# Patient Record
Sex: Female | Born: 1990 | Race: White | Hispanic: No | Marital: Single | Smoking: Former smoker
Health system: Southern US, Community
[De-identification: ages and names within clinical notes are randomized; demographics above are authoritative.]

## PROBLEM LIST (undated history)

## (undated) DIAGNOSIS — A749 Chlamydial infection, unspecified: Secondary | ICD-10-CM

## (undated) DIAGNOSIS — Z789 Other specified health status: Secondary | ICD-10-CM

## (undated) HISTORY — PX: CHOLECYSTECTOMY: SHX55

## (undated) HISTORY — DX: Chlamydial infection, unspecified: A74.9

## (undated) HISTORY — PX: WISDOM TOOTH EXTRACTION: SHX21

---

## 2016-01-26 ENCOUNTER — Emergency Department (HOSPITAL_COMMUNITY)
Admission: EM | Admit: 2016-01-26 | Discharge: 2016-01-26 | Disposition: A | Discharge status: Home / Self Care | Payer: BLUE CROSS/BLUE SHIELD | Attending: Emergency Medicine | Admitting: Emergency Medicine

## 2016-01-26 ENCOUNTER — Encounter (HOSPITAL_COMMUNITY): Payer: Self-pay | Admitting: Emergency Medicine

## 2016-01-26 DIAGNOSIS — N390 Urinary tract infection, site not specified: Secondary | ICD-10-CM | POA: Diagnosis not present

## 2016-01-26 DIAGNOSIS — Z711 Person with feared health complaint in whom no diagnosis is made: Secondary | ICD-10-CM

## 2016-01-26 DIAGNOSIS — Z87891 Personal history of nicotine dependence: Secondary | ICD-10-CM | POA: Insufficient documentation

## 2016-01-26 DIAGNOSIS — R300 Dysuria: Secondary | ICD-10-CM | POA: Diagnosis present

## 2016-01-26 DIAGNOSIS — Z79899 Other long term (current) drug therapy: Secondary | ICD-10-CM | POA: Insufficient documentation

## 2016-01-26 LAB — COMPREHENSIVE METABOLIC PANEL
ALBUMIN: 3.7 g/dL (ref 3.5–5.0)
ALK PHOS: 85 U/L (ref 38–126)
ALT: 15 U/L (ref 14–54)
ANION GAP: 5 (ref 5–15)
AST: 14 U/L — ABNORMAL LOW (ref 15–41)
BUN: 14 mg/dL (ref 6–20)
CALCIUM: 8.9 mg/dL (ref 8.9–10.3)
CO2: 24 mmol/L (ref 22–32)
Chloride: 107 mmol/L (ref 101–111)
Creatinine, Ser: 0.85 mg/dL (ref 0.44–1.00)
GFR calc Af Amer: >60 mL/min (ref >60)
GFR calc non Af Amer: >60 mL/min (ref >60)
GLUCOSE: 101 mg/dL — AB (ref 65–99)
Potassium: 4.1 mmol/L (ref 3.5–5.1)
SODIUM: 136 mmol/L (ref 135–145)
Total Bilirubin: 0.7 mg/dL (ref 0.3–1.2)
Total Protein: 7.1 g/dL (ref 6.5–8.1)

## 2016-01-26 LAB — WET PREP, GENITAL
Sperm: NONE SEEN
Trich, Wet Prep: NONE SEEN
Yeast Wet Prep HPF POC: NONE SEEN

## 2016-01-26 LAB — URINALYSIS, ROUTINE W REFLEX MICROSCOPIC
Bilirubin Urine: NEGATIVE
GLUCOSE, UA: NEGATIVE mg/dL
Ketones, ur: NEGATIVE mg/dL
Nitrite: NEGATIVE
PH: 6 (ref 5.0–8.0)
Protein, ur: NEGATIVE mg/dL
SPECIFIC GRAVITY, URINE: 1.015 (ref 1.005–1.030)

## 2016-01-26 LAB — CBC WITH DIFFERENTIAL/PLATELET
BASOS PCT: 0 %
Basophils Absolute: 0 10*3/uL (ref 0.0–0.1)
Eosinophils Absolute: 0.1 10*3/uL (ref 0.0–0.7)
Eosinophils Relative: 1 %
HEMATOCRIT: 41 % (ref 36.0–46.0)
HEMOGLOBIN: 13.5 g/dL (ref 12.0–15.0)
LYMPHS ABS: 1.9 10*3/uL (ref 0.7–4.0)
LYMPHS PCT: 18 %
MCH: 28.8 pg (ref 26.0–34.0)
MCHC: 32.9 g/dL (ref 30.0–36.0)
MCV: 87.4 fL (ref 78.0–100.0)
MONOS PCT: 6 %
Monocytes Absolute: 0.6 10*3/uL (ref 0.1–1.0)
NEUTROS ABS: 7.9 10*3/uL — AB (ref 1.7–7.7)
NEUTROS PCT: 75 %
Platelets: 233 10*3/uL (ref 150–400)
RBC: 4.69 MIL/uL (ref 3.87–5.11)
RDW: 13.7 % (ref 11.5–15.5)
WBC: 10.5 10*3/uL (ref 4.0–10.5)

## 2016-01-26 LAB — URINE MICROSCOPIC-ADD ON

## 2016-01-26 LAB — PREGNANCY, URINE: Preg Test, Ur: NEGATIVE

## 2016-01-26 MED ORDER — AZITHROMYCIN 250 MG PO TABS
1000.0000 mg | ORAL_TABLET | Freq: Once | ORAL | Status: AC
  Administered 2016-01-26: 1000 mg via ORAL
  Filled 2016-01-26: qty 4

## 2016-01-26 MED ORDER — METRONIDAZOLE 500 MG PO TABS: 500.0000 mg | ORAL_TABLET | Freq: Two times a day (BID) | ORAL | Status: DC

## 2016-01-26 MED ORDER — NAPROXEN 250 MG PO TABS: 250.0000 mg | ORAL_TABLET | Freq: Two times a day (BID) | ORAL | Status: DC

## 2016-01-26 MED ORDER — SODIUM CHLORIDE 0.9 % IV BOLUS (SEPSIS)
1000.0000 mL | Freq: Once | INTRAVENOUS | Status: AC
  Administered 2016-01-26: 1000 mL via INTRAVENOUS

## 2016-01-26 MED ORDER — ACETAMINOPHEN 325 MG PO TABS
650.0000 mg | ORAL_TABLET | Freq: Once | ORAL | Status: AC
  Administered 2016-01-26: 650 mg via ORAL
  Filled 2016-01-26: qty 2

## 2016-01-26 MED ORDER — LIDOCAINE HCL (PF) 1 % IJ SOLN
INTRAMUSCULAR | Status: AC
  Filled 2016-01-26: qty 5

## 2016-01-26 MED ORDER — CEFTRIAXONE SODIUM 250 MG IJ SOLR
250.0000 mg | Freq: Once | INTRAMUSCULAR | Status: AC
  Administered 2016-01-26: 250 mg via INTRAMUSCULAR
  Filled 2016-01-26: qty 250

## 2016-01-26 MED ORDER — ONDANSETRON 4 MG PO TBDP: 4.0000 mg | ORAL_TABLET | Freq: Three times a day (TID) | ORAL | Status: DC | PRN

## 2016-01-26 MED ORDER — ONDANSETRON HCL 4 MG/2ML IJ SOLN
4.0000 mg | Freq: Once | INTRAMUSCULAR | Status: AC
  Administered 2016-01-26: 4 mg via INTRAVENOUS
  Filled 2016-01-26: qty 2

## 2016-01-26 MED ORDER — CEPHALEXIN 500 MG PO CAPS: 500.0000 mg | ORAL_CAPSULE | Freq: Four times a day (QID) | ORAL | Status: DC

## 2016-01-26 NOTE — ED Notes (Addendum)
Pt reports UTI x2 weeks but pt has not been on antibiotics. Pt reports burning with urination and odor.  Pt has been having fevers/chills and emesis.  PT has been hospitalized for UTI in past.

## 2016-01-26 NOTE — Discharge Instructions (Signed)
Urinary Tract Infection Urinary tract infections (UTIs) can develop anywhere along your urinary tract. Your urinary tract is your body's drainage system for removing wastes and extra water. Your urinary tract includes two kidneys, two ureters, a bladder, and a urethra. Your kidneys are a pair of bean-shaped organs. Each kidney is about the size of your fist. They are located below your ribs, one on each side of your spine. CAUSES Infections are caused by microbes, which are microscopic organisms, including fungi, viruses, and bacteria. These organisms are so small that they can only be seen through a microscope. Bacteria are the microbes that most commonly cause UTIs. SYMPTOMS  Symptoms of UTIs may vary by age and gender of the patient and by the location of the infection. Symptoms in young women typically include a frequent and intense urge to urinate and a painful, burning feeling in the bladder or urethra during urination. Older women and men are more likely to be tired, shaky, and weak and have muscle aches and abdominal pain. A fever may mean the infection is in your kidneys. Other symptoms of a kidney infection include pain in your back or sides below the ribs, nausea, and vomiting. DIAGNOSIS To diagnose a UTI, your caregiver will ask you about your symptoms. Your caregiver will also ask you to provide a urine sample. The urine sample will be tested for bacteria and white blood cells. White blood cells are made by your body to help fight infection. TREATMENT  Typically, UTIs can be treated with medication. Because most UTIs are caused by a bacterial infection, they usually can be treated with the use of antibiotics. The choice of antibiotic and length of treatment depend on your symptoms and the type of bacteria causing your infection. HOME CARE INSTRUCTIONS  If you were prescribed antibiotics, take them exactly as your caregiver instructs you. Finish the medication even if you feel better after  you have only taken some of the medication.  Drink enough water and fluids to keep your urine clear or pale yellow.  Avoid caffeine, tea, and carbonated beverages. They tend to irritate your bladder.  Empty your bladder often. Avoid holding urine for long periods of time.  Empty your bladder before and after sexual intercourse.  After a bowel movement, women should cleanse from front to back. Use each tissue only once. SEEK MEDICAL CARE IF:   You have back pain.  You develop a fever.  Your symptoms do not begin to resolve within 3 days. SEEK IMMEDIATE MEDICAL CARE IF:   You have severe back pain or lower abdominal pain.  You develop chills.  You have nausea or vomiting.  You have continued burning or discomfort with urination. MAKE SURE YOU:   Understand these instructions.  Will watch your condition.  Will get help right away if you are not doing well or get worse.   This information is not intended to replace advice given to you by your health care provider. Make sure you discuss any questions you have with your health care provider.   Document Released: 05/17/2005 Document Revised: 04/28/2015 Document Reviewed: 09/15/2011 Elsevier Interactive Patient Education 2016 [employer] Sexually Transmitted Disease A sexually transmitted disease (STD) is a disease or infection that may be passed (transmitted) from person to person, usually during sexual activity. This may happen by way of saliva, semen, blood, vaginal mucus, or urine. Common STDs include:  Gonorrhea.  Chlamydia.  Syphilis.  HIV and AIDS.  Genital herpes.  Hepatitis B and C.  Trichomonas.  Human papillomavirus (HPV).  Pubic lice.  Scabies.  Mites.  Bacterial vaginosis. WHAT ARE CAUSES OF STDs? An STD may be caused by bacteria, a virus, or parasites. STDs are often transmitted during sexual activity if one person is infected. However, they may also be transmitted through nonsexual means.  STDs may be transmitted after:   Sexual intercourse with an infected person.  Sharing sex toys with an infected person.  Sharing needles with an infected person or using unclean piercing or tattoo needles.  Having intimate contact with the genitals, mouth, or rectal areas of an infected person.  Exposure to infected fluids during birth. WHAT ARE THE SIGNS AND SYMPTOMS OF STDs? Different STDs have different symptoms. Some people may not have any symptoms. If symptoms are present, they may include:  Painful or bloody urination.  Pain in the pelvis, abdomen, vagina, anus, throat, or eyes.  A skin rash, itching, or irritation.  Growths, ulcerations, blisters, or sores in the genital and anal areas.  Abnormal vaginal discharge with or without bad odor.  Penile discharge in men.  Fever.  Pain or bleeding during sexual intercourse.  Swollen glands in the groin area.  Yellow skin and eyes (jaundice). This is seen with hepatitis.  Swollen testicles.  Infertility.  Sores and blisters in the mouth. HOW ARE STDs DIAGNOSED? To make a diagnosis, your health care provider may:  Take a medical history.  Perform a physical exam.  Take a sample of any discharge to examine.  Swab the throat, cervix, opening to the penis, rectum, or vagina for testing.  Test a sample of your first morning urine.  Perform blood tests.  Perform a Pap test, if this applies.  Perform a colposcopy.  Perform a laparoscopy. HOW ARE STDs TREATED? Treatment depends on the STD. Some STDs may be treated but not cured.  Chlamydia, gonorrhea, trichomonas, and syphilis can be cured with antibiotic medicine.  Genital herpes, hepatitis, and HIV can be treated, but not cured, with prescribed medicines. The medicines lessen symptoms.  Genital warts from HPV can be treated with medicine or by freezing, burning (electrocautery), or surgery. Warts may come back.  HPV cannot be cured with medicine or  surgery. However, abnormal areas may be removed from the cervix, vagina, or vulva.  If your diagnosis is confirmed, your recent sexual partners need treatment. This is true even if they are symptom-free or have a negative culture or evaluation. They should not have sex until their health care providers say it is okay.  Your health care provider may test you for infection again 3 months after treatment. HOW CAN I REDUCE MY RISK OF GETTING AN STD? Take these steps to reduce your risk of getting an STD:  Use latex condoms, dental dams, and water-soluble lubricants during sexual activity. Do not use petroleum jelly or oils.  Avoid having multiple sex partners.  Do not have sex with someone who has other sex partners  Do not have sex with anyone you do not know or who is at high risk for an STD.  Avoid risky sex practices that can break your skin.  Do not have sex if you have open sores on your mouth or skin.  Avoid drinking too much alcohol or taking illegal drugs. Alcohol and drugs can affect your judgment and put you in a vulnerable position.  Avoid engaging in oral and anal sex acts.  Get vaccinated for HPV and hepatitis. If you have not received these vaccines in the past,  talk to your health care provider about whether one or both might be right for you.  If you are at risk of being infected with HIV, it is recommended that you take a prescription medicine daily to prevent HIV infection. This is called pre-exposure prophylaxis (PrEP). You are considered at risk if:  You are a man who has sex with other men (MSM).  You are a heterosexual man or woman and are sexually active with more than one partner.  You take drugs by injection.  You are sexually active with a partner who has HIV.  Talk with your health care provider about whether you are at high risk of being infected with HIV. If you choose to begin PrEP, you should first be tested for HIV. You should then be tested every 3  months for as long as you are taking PrEP. WHAT SHOULD I DO IF I THINK I HAVE AN STD?  See your health care provider.  Tell your sexual partner(s). They should be tested and treated for any STDs.  Do not have sex until your health care provider says it is okay. WHEN SHOULD I GET IMMEDIATE MEDICAL CARE? Contact your health care provider right away if:   You have severe abdominal pain.  You are a man and notice swelling or pain in your testicles.  You are a woman and notice swelling or pain in your vagina.   This information is not intended to replace advice given to you by your health care provider. Make sure you discuss any questions you have with your health care provider.   Document Released: 10/28/2002 Document Revised: 08/28/2014 Document Reviewed: 02/25/2013 Elsevier Interactive Patient Education [employer]

## 2016-01-26 NOTE — ED Provider Notes (Signed)
CSN: [id number]     Arrival date & time 01/26/16  1043 History   First MD Initiated Contact with Patient 01/26/16 1102     Chief Complaint  Patient presents with  . Urinary Tract Infection    Denise Buck is a 25 y.o. female Who presents to the emergency department complaining of UTI symptoms for the past 2 weeks. Patient reports she's had dysuria, urinary frequency and urinary urgency and urine odor for about 2 weeks. She reports developing a subjective fever, chills and body aches over the past 2-3 days. She reports she's also had some vomiting after eating. She also reports she developed vaginal discharge over the past 2 days. She has taken nothing for treatment of her symptoms today. She reports a maximum temperature of 99.8 at home. She reports intermittent abdominal pain. She denies current abdominal pain. The patient denies diarrhea, hematuria, flank pain, hematemesis, chest pain, shortness of breath, vaginal bleeding, rashes.  The history is provided by the patient. No language interpreter was used.    History reviewed. No pertinent past medical history. Past Surgical History  Procedure Laterality Date  . Cholecystectomy    . Cesarean section     History reviewed. No pertinent family history. Social History  Substance Use Topics  . Smoking status: Former Games developer  . Smokeless tobacco: None  . Alcohol Use: Yes     Comment: occ   OB History    No data available     Review of Systems  Constitutional: Positive for fever and chills.  HENT: Negative for congestion, mouth sores and sore throat.   Eyes: Negative for visual disturbance.  Respiratory: Negative for cough and shortness of breath.   Cardiovascular: Negative for chest pain.  Gastrointestinal: Positive for nausea and vomiting. Negative for abdominal pain, diarrhea and blood in stool.  Genitourinary: Positive for dysuria, urgency, frequency and vaginal discharge. Negative for hematuria, flank pain, decreased urine  volume, vaginal bleeding, difficulty urinating, genital sores and pelvic pain.  Musculoskeletal: Positive for back pain and arthralgias. Negative for neck pain.  Skin: Negative for rash.  Neurological: Negative for weakness and headaches.      Allergies  Hydrocodone  Home Medications   Prior to Admission medications   Medication Sig Start Date End Date Taking? Authorizing Provider  acetaminophen (TYLENOL) 500 MG tablet Take 1,000 mg by mouth every 6 (six) hours as needed for moderate pain.   Yes Historical Provider, MD  cephALEXin (KEFLEX) 500 MG capsule Take 1 capsule (500 mg total) by mouth 4 (four) times daily. 01/26/16   Everlene Farrier, PA-C  metroNIDAZOLE (FLAGYL) 500 MG tablet Take 1 tablet (500 mg total) by mouth 2 (two) times daily. 01/26/16   Everlene Farrier, PA-C  naproxen (NAPROSYN) 250 MG tablet Take 1 tablet (250 mg total) by mouth 2 (two) times daily with a meal. 01/26/16   Everlene Farrier, PA-C  ondansetron (ZOFRAN ODT) 4 MG disintegrating tablet Take 1 tablet (4 mg total) by mouth every 8 (eight) hours as needed for nausea or vomiting. 01/26/16   Everlene Farrier, PA-C   BP 142/78 mmHg  Pulse 78  Temp(Src) 98 F (36.7 C) (Oral)  Resp 16  SpO2 100%  LMP 01/11/2016 Physical Exam  Constitutional: She appears well-developed and well-nourished. No distress.  Nontoxic appearing.  HENT:  Head: Normocephalic and atraumatic.  Mouth/Throat: Oropharynx is clear and moist.  Eyes: Conjunctivae are normal. Pupils are equal, round, and reactive to light. Right eye exhibits no discharge. Left eye exhibits no discharge.  Neck: Neck supple.  Cardiovascular: Normal rate, regular rhythm, normal heart sounds and intact distal pulses.  Exam reveals no gallop and no friction rub.   No murmur heard. Heart rate is 76.  Pulmonary/Chest: Effort normal and breath sounds normal. No respiratory distress. She has no wheezes. She has no rales.  Abdominal: Soft. Bowel sounds are normal. She exhibits no  distension. There is no tenderness. There is no guarding.  Abdomen is soft and nontender to palpation.  Genitourinary: Vaginal discharge found.  Pelvic exam performed by me with female RN chaperone. Patient has a moderate amount of white vaginal discharge. Cervix is closed. No vaginal bleeding. Patient was a mild cervical motion tenderness. Mild adnexal tenderness bilaterally. No adnexal fullness.  Musculoskeletal: She exhibits no edema.  Lymphadenopathy:    She has no cervical adenopathy.  Neurological: She is alert. Coordination normal.  Skin: Skin is warm and dry. No rash noted. She is not diaphoretic. No erythema. No pallor.  Psychiatric: She has a normal mood and affect. Her behavior is normal.  Nursing note and vitals reviewed.   ED Course  Procedures (including critical care time) Labs Review Labs Reviewed  WET PREP, GENITAL - Abnormal; Notable for the following:    Clue Cells Wet Prep HPF POC PRESENT (*)    WBC, Wet Prep HPF POC MANY (*)    All other components within normal limits  URINALYSIS, ROUTINE W REFLEX MICROSCOPIC (NOT AT [facility]) - Abnormal; Notable for the following:    Hgb urine dipstick MODERATE (*)    Leukocytes, UA MODERATE (*)    All other components within normal limits  CBC WITH DIFFERENTIAL/PLATELET - Abnormal; Notable for the following:    Neutro Abs 7.9 (*)    All other components within normal limits  URINE MICROSCOPIC-ADD ON - Abnormal; Notable for the following:    Squamous Epithelial / LPF 0-5 (*)    Bacteria, UA FEW (*)    All other components within normal limits  COMPREHENSIVE METABOLIC PANEL - Abnormal; Notable for the following:    Glucose, Bld 101 (*)    AST 14 (*)    All other components within normal limits  URINE CULTURE  PREGNANCY, URINE  GC/CHLAMYDIA PROBE AMP ([place]) NOT AT [facility]    Imaging Review No results found. I have personally reviewed and evaluated these images and lab results as part of my medical decision-making.    EKG Interpretation None      Filed Vitals:   01/26/16 1101 01/26/16 1150 01/26/16 1332  BP: 120/88 155/94 142/78  Pulse: 124 76 78  Temp: 98.5 F (36.9 C)  98 F (36.7 C)  TempSrc: Oral  Oral  Resp: 20 16 16   SpO2: 100% 100% 100%     MDM   Meds given in ED:  Medications  cefTRIAXone (ROCEPHIN) injection 250 mg (not administered)  azithromycin (ZITHROMAX) tablet 1,000 mg (not administered)  lidocaine (PF) (XYLOCAINE) 1 % injection (not administered)  sodium chloride 0.9 % bolus 1,000 mL (0 mLs Intravenous Stopped 01/26/16 1302)  ondansetron (ZOFRAN) injection 4 mg (4 mg Intravenous Given 01/26/16 1252)  acetaminophen (TYLENOL) tablet 650 mg (650 mg Oral Given 01/26/16 1307)    New Prescriptions   CEPHALEXIN (KEFLEX) 500 MG CAPSULE    Take 1 capsule (500 mg total) by mouth 4 (four) times daily.   METRONIDAZOLE (FLAGYL) 500 MG TABLET    Take 1 tablet (500 mg total) by mouth 2 (two) times daily.   NAPROXEN (NAPROSYN) 250 MG TABLET  Take 1 tablet (250 mg total) by mouth 2 (two) times daily with a meal.   ONDANSETRON (ZOFRAN ODT) 4 MG DISINTEGRATING TABLET    Take 1 tablet (4 mg total) by mouth every 8 (eight) hours as needed for nausea or vomiting.    Final diagnoses:  UTI (lower urinary tract infection)  Concern about STD in female without diagnosis   This  is a 25 y.o. female Who presents to the emergency department complaining of UTI symptoms for the past 2 weeks. Patient reports she's had dysuria, urinary frequency and urinary urgency and urine odor for about 2 weeks. She reports developing a subjective fever, chills and body aches over the past 2-3 days. She reports she's also had some vomiting after eating. She also reports she developed vaginal discharge over the past 2 days. She has taken nothing for treatment of her symptoms today. She reports a maximum temperature of 99.8 at home. She reports intermittent abdominal pain. She denies current abdominal pain. On exam the patient  is afebrile nontoxic appearing. Her abdomen is soft and nontender to palpation. On pelvic exam she has moderate white vaginal discharge. Some very mild cervical motion tenderness. Urinalysis shows moderate leukocytes and too numerous to count white blood cells. Pregnancy test is negative. CBC and CMP are unremarkable. Urine sent for culture. Wet prep reveals clue cells and many white blood cells. We'll treat with Rocephin and azithromycin in the emergency department and discharged with prescriptions for Keflex 4 times a day for 10 days and Flagyl. Also discharged with Zofran and naproxen. Patient is tolerating by mouth in the emergency department. She is still afebrile and nontoxic appearing. Abdomen is nontender to palpation. We'll discharge with close follow-up by primary care and health department for routine STD checks. I discussed that she has pending gonorrhea and Chlamydia testing. I discussed safe sex practices. I advised the patient to follow-up with their primary care provider this week. I advised the patient to return to the emergency department with new or worsening symptoms or new concerns. The patient verbalized understanding and agreement with plan.      Everlene Farrier, PA-C 01/26/16 1341  Jacalyn Lefevre, MD 01/26/16 [id number]

## 2016-01-27 LAB — GC/CHLAMYDIA PROBE AMP (~~LOC~~) NOT AT ARMC
CHLAMYDIA, DNA PROBE: POSITIVE — AB
NEISSERIA GONORRHEA: POSITIVE — AB

## 2016-01-27 LAB — URINE CULTURE

## 2016-01-28 ENCOUNTER — Telehealth: Payer: Self-pay | Admitting: *Deleted

## 2016-01-28 NOTE — ED Notes (Signed)
(+)  STD, chart to EDP for followup 

## 2016-01-30 ENCOUNTER — Telehealth (HOSPITAL_BASED_OUTPATIENT_CLINIC_OR_DEPARTMENT_OTHER): Payer: Self-pay

## 2016-01-30 NOTE — Telephone Encounter (Signed)
Post ED Visit - Positive Culture Follow-up: Unsuccessful Patient Follow-up  Culture assessed and recommendations reviewed by: []  Enzo BiNathan Batchelder, Pharm.D. []  Celedonio MiyamotoJeremy Frens, Pharm.D., BCPS []  Garvin FilaMike Maccia, Pharm.D. []  Georgina PillionElizabeth Martin, Pharm.D., BCPS []  [place]h Pham, [place]D., BCPS, AAHIVP []  Estella HuskMichelle Turner, Pharm.D., BCPS, AAHIVP []  Tennis Mustassie Stewart, Pharm.D. []  Sherle Poeob Vincent, [address]Ph[address]  Positive vaginal culture  [x]  Patient discharged without antimicrobial prescription and treatment is now indicated []  Organism is resistant to prescribed ED discharge antimicrobial []  Patient with positive blood cultures   Unable to contact patient after 3 attempts, letter will be sent to address on file  Jerry CarasCullom, Korinne Greenstein Burnett 01/30/2016, 11:48 AM

## 2016-01-31 ENCOUNTER — Telehealth (HOSPITAL_BASED_OUTPATIENT_CLINIC_OR_DEPARTMENT_OTHER): Payer: Self-pay | Admitting: Emergency Medicine

## 2016-02-01 ENCOUNTER — Emergency Department (HOSPITAL_COMMUNITY)
Admission: EM | Admit: 2016-02-01 | Discharge: 2016-02-01 | Disposition: A | Discharge status: Home / Self Care | Payer: BLUE CROSS/BLUE SHIELD | Attending: Emergency Medicine | Admitting: Emergency Medicine

## 2016-02-01 ENCOUNTER — Encounter (HOSPITAL_COMMUNITY): Payer: Self-pay | Admitting: Emergency Medicine

## 2016-02-01 DIAGNOSIS — A549 Gonococcal infection, unspecified: Secondary | ICD-10-CM | POA: Diagnosis not present

## 2016-02-01 DIAGNOSIS — Z87891 Personal history of nicotine dependence: Secondary | ICD-10-CM | POA: Diagnosis not present

## 2016-02-01 DIAGNOSIS — A749 Chlamydial infection, unspecified: Secondary | ICD-10-CM

## 2016-02-01 DIAGNOSIS — A64 Unspecified sexually transmitted disease: Secondary | ICD-10-CM | POA: Diagnosis present

## 2016-02-01 MED ORDER — LIDOCAINE HCL (PF) 1 % IJ SOLN
0.9000 mL | Freq: Once | INTRAMUSCULAR | Status: AC
  Administered 2016-02-01: 0.9 mL

## 2016-02-01 MED ORDER — AZITHROMYCIN 250 MG PO TABS
1000.0000 mg | ORAL_TABLET | Freq: Once | ORAL | Status: AC
  Administered 2016-02-01: 1000 mg via ORAL
  Filled 2016-02-01: qty 4

## 2016-02-01 MED ORDER — CEFTRIAXONE SODIUM 250 MG IJ SOLR
250.0000 mg | Freq: Once | INTRAMUSCULAR | Status: AC
  Administered 2016-02-01: 250 mg via INTRAMUSCULAR
  Filled 2016-02-01: qty 250

## 2016-02-01 MED ORDER — LIDOCAINE HCL (PF) 1 % IJ SOLN
INTRAMUSCULAR | Status: AC
  Administered 2016-02-01: 0.9 mL
  Filled 2016-02-01: qty 5

## 2016-02-01 NOTE — ED Provider Notes (Signed)
CSN: [id number]     Arrival date & time 02/01/16  0818 History   First MD Initiated Contact with Patient 02/01/16 [phone]     Chief Complaint  Patient presents with  . SEXUALLY TRANSMITTED DISEASE     (Consider location/radiation/quality/duration/timing/severity/associated sxs/prior Treatment) HPI Denise Buck is a 25 y.o. female presents to emergency department for treatment for STDs. She states that she received a phone call that her swabs were positive for an STD and was told to come to the emergency department. Patient was seen 6 days ago for urinary symptoms. She was diagnosed with UTI and cervicitis. While in emergency department she was treated with 250 mg of Rocephin through IV and was given Zithromax 1 g by mouth as well as Flagyl. She states shortly after taking the pills she threw up and she reports seeing pills in her emesis bag. She also does not believe that she was given the antibiotic, states she never saw nurse put anything to her IV other than Zofran. Patient was also given prescription for Keflex and Flagyl which she just filled 2 days ago. She states that she feels that her urinary symptoms are improving. She continues to have small white discharge report some spotting. She denies any fever or chills. She reports mild abdominal cramping. She denies any back pain. She denies any nausea or vomiting. She has no other complaints.   History reviewed. No pertinent past medical history. Past Surgical History  Procedure Laterality Date  . Cholecystectomy    . Cesarean section     No family history on file. Social History  Substance Use Topics  . Smoking status: Former Games developermoker  . Smokeless tobacco: None  . Alcohol Use: Yes     Comment: occ   OB History    No data available     Review of Systems  Constitutional: Negative for fever and chills.  Respiratory: Negative for cough, chest tightness and shortness of breath.   Cardiovascular: Negative for chest pain, palpitations  and leg swelling.  Gastrointestinal: Positive for abdominal pain. Negative for nausea, vomiting and diarrhea.  Genitourinary: Positive for dysuria, vaginal bleeding, vaginal discharge and pelvic pain. Negative for flank pain and vaginal pain.  Musculoskeletal: Negative for myalgias, arthralgias, neck pain and neck stiffness.  Skin: Negative for rash.  Neurological: Negative for dizziness, weakness and headaches.  All other systems reviewed and are negative.     Allergies  Hydrocodone  Home Medications   Prior to Admission medications   Medication Sig Start Date End Date Taking? Authorizing Provider  acetaminophen (TYLENOL) 500 MG tablet Take 1,000 mg by mouth every 6 (six) hours as needed for moderate pain.    Historical Provider, MD  cephALEXin (KEFLEX) 500 MG capsule Take 1 capsule (500 mg total) by mouth 4 (four) times daily. 01/26/16   Everlene FarrierWilliam Dansie, PA-C  metroNIDAZOLE (FLAGYL) 500 MG tablet Take 1 tablet (500 mg total) by mouth 2 (two) times daily. 01/26/16   Everlene FarrierWilliam Dansie, PA-C  naproxen (NAPROSYN) 250 MG tablet Take 1 tablet (250 mg total) by mouth 2 (two) times daily with a meal. 01/26/16   Everlene FarrierWilliam Dansie, PA-C  ondansetron (ZOFRAN ODT) 4 MG disintegrating tablet Take 1 tablet (4 mg total) by mouth every 8 (eight) hours as needed for nausea or vomiting. 01/26/16   Everlene FarrierWilliam Dansie, PA-C   BP 131/85 mmHg  Pulse 69  Temp(Src) 98 F (36.7 C) (Oral)  Resp 16  Ht 5\' 7"  (1.702 m)  Wt 122.471 kg  BMI 42.28  kg/m2  SpO2 98%  LMP 01/11/2016 Physical Exam  Constitutional: She appears well-developed and well-nourished. No distress.  HENT:  Head: Normocephalic.  Eyes: Conjunctivae are normal.  Neck: Neck supple.  Cardiovascular: Normal rate, regular rhythm and normal heart sounds.   Pulmonary/Chest: Effort normal and breath sounds normal. No respiratory distress. She has no wheezes. She has no rales.  Abdominal: Soft. Bowel sounds are normal. She exhibits no distension. There is no  tenderness. There is no rebound.  Musculoskeletal: She exhibits no edema.  Neurological: She is alert.  Skin: Skin is warm and dry.  Psychiatric: She has a normal mood and affect. Her behavior is normal.  Nursing note and vitals reviewed.   ED Course  Procedures (including critical care time) Labs Review Labs Reviewed - No data to display  Imaging Review No results found. I have personally reviewed and evaluated these images and lab results as part of my medical decision-making.   EKG Interpretation None      MDM   Final diagnoses:  Gonorrhea  Chlamydia    Patient emergency department for treatment for STDs. She received a phone call telling her that her gonorrhea and chlamydia test came back positive. She was told to come to the ER. Patient did receive 250 mg of Rocephin IV based on documentation in the visit record, however patient states that she does not think she actually got the medicine prior to leaving. She also states that she was given Zithromax but she states that she vomited shortly after swallowing it and saw the pills in her emesis bag. Patient requested to be treated again. On exam, her abdomen is soft, nontender. I do not think she needs a repeat pelvic exam. Her gonorrhea and chlamydia cultures did come back positive. She is currently taking Flagyl and Keflex which she just got filled 2 days ago for urinary tract infection and BV. Her vital signs are normal. She has no evidence of sepsis or PID based on her history and physical exam. We will treat her with 250 mg of Rocephin IM and 1 g of Zithromax. I have asked her to make sure her partner gets treated as well.   Filed Vitals:   02/01/16 0828  BP: 131/85  Pulse: 69  Temp: 98 F (36.7 C)  TempSrc: Oral  Resp: 16  Height:  (1.702 m)  Weight: 122.471 kg  SpO2: 98%       Jaynie Crumble, PA-C 02/01/16 1211  Blane Ohara, MD 02/01/16 1552

## 2016-02-01 NOTE — Discharge Instructions (Signed)
Continue the antibiotics you were prescribed during last visit for your UTI. Follow up as needed. Make sure your partner is treated as well. No intercourse for 1 week.    Chlamydia, Female Chlamydia is an infection. It is spread through sexual contact. Chlamydia can be in different areas of the body. These areas include the cervix, urethra, throat, or rectum. You may not know you have chlamydia because many people never develop the symptoms. Chlamydia is not difficult to treat once you know you have it. However, if it is left untreated, chlamydia can lead to more serious health problems.  CAUSES  Chlamydia is caused by bacteria. It is a sexually transmitted disease. It is passed from an infected partner during intimate contact. This contact could be with the genitals, mouth, or rectal area. Chlamydia can also be passed from mothers to babies during birth. SIGNS AND SYMPTOMS  There may not be any symptoms. This is often the case early in the infection. If symptoms develop, they may include:  Mild pain and discomfort when urinating.  Redness, soreness, and swelling (inflammation) of the rectum.  Vaginal discharge.  Painful intercourse.  Abdominal pain.  Bleeding between menstrual periods. DIAGNOSIS  To diagnose this infection, your health care provider will do a pelvic exam. Cultures will be taken of the vagina, cervix, urine, and possibly the rectum to verify the diagnosis.  TREATMENT You will be given antibiotic medicines. If you are pregnant, certain types of antibiotics will need to be avoided. Any sexual partners should also be treated, even if they do not show symptoms. Your health care provider may test you for infection again 3 months after treatment. HOME CARE INSTRUCTIONS   Take your antibiotic medicine as directed by your health care provider. Finish the antibiotic even if you start to feel better.  Take medicines only as directed by your health care provider.  Inform any  sexual partners about the infection. They should also be treated.  Do not have sexual contact until your health care provider tells you it is okay.  Get plenty of rest.  Eat a well-balanced diet.  Drink enough fluids to keep your urine clear or pale yellow.  Keep all follow-up visits as directed by your health care provider. SEEK MEDICAL CARE IF:  You have painful urination.  You have abdominal pain.  You have vaginal discharge.  You have painful sexual intercourse.  You have bleeding between periods and after sex.  You have a fever. SEEK IMMEDIATE MEDICAL CARE IF:   You experience nausea or vomiting.  You experience excessive sweating (diaphoresis).  You have difficulty swallowing.   This information is not intended to replace advice given to you by your health care provider. Make sure you discuss any questions you have with your health care provider.   Document Released: 05/17/2005 Document Revised: 04/28/2015 Document Reviewed: 04/14/2013 Elsevier Interactive Patient Education [employer]  Gonorrhea Gonorrhea is an infection that can cause serious problems. If left untreated, the infection may:   Damage the female or female organs.   Cause women to be unable to have children (sterility).   Harm a fetus if the infected woman is pregnant.  It is important to get treatment for gonorrhea as soon as possible. It is also necessary that all your sexual partners be tested for the infection.  CAUSES  Gonorrhea is caused by bacteria called Neisseria gonorrhoeae. The infection is spread from person to person, usually by sexual contact (such as by anal, vaginal, or oral  means). A newborn can contract the infection from his or her mother during birth.  RISK FACTORS  Being a woman younger than 24 years of age who is sexually active.  Being a woman 24 years of age or older who has:  A new sex partner.  More than one sex partner.  A sex partner who has a sexually  transmitted disease (STD).  Using condoms inconsistently.  Currently having, or having previously had, an STD.  Exchanging sex or money or drugs. SYMPTOMS  Some people with gonorrhea do not have symptoms. Symptoms may be different in females and males.  Females The most common symptoms are:   Pain in the lower abdomen.   Fever with or without chills.  Other symptoms include:   Abnormal vaginal discharge.   Painful intercourse.   Burning or itching of the vagina or lips of the vagina.   Abnormal vaginal bleeding.   Pain when urinating.   Long-lasting (chronic) pain in the lower abdomen, especially during menstruation or intercourse.   Inability to become pregnant.   Going into premature labor.   Irritation, pain, bleeding, or discharge from the rectum. This may occur if the infection was spread by anal sex.   Sore throat or swollen lymph nodes in the neck. This may occur if the infection was spread by oral sex.  Males The most common symptoms are:   Discharge from the penis.   Pain or burning during urination.   Pain or swelling in the testicles. Other symptoms may include:   Irritation, pain, bleeding, or discharge from the rectum. This may occur if the infection was spread by anal sex.   Sore throat, fever, or swollen lymph nodes in the neck. This may occur if the infection was spread by oral sex.  DIAGNOSIS  A diagnosis is made after a physical exam is done and a sample of discharge is examined under a microscope for the presence of the bacteria. The discharge may be taken from the urethra, cervix, throat, or rectum.  TREATMENT  Gonorrhea is treated with antibiotic medicines. It is important for treatment to begin as soon as possible. Early treatment may prevent some problems from developing. Do not have sex. Avoid all types of sexual activity for 7 days after treatment is complete and until any sex partners have been treated. HOME CARE  INSTRUCTIONS   Take medicines only as directed by your health care provider.   Take your antibiotic medicine as directed by your health care provider. Finish the antibiotic even if you start to feel better. Incomplete treatment will put you at risk for continued infection.   Do not have sex until treatment is complete or as directed by your health care provider.   Keep all follow-up visits as directed by your health care provider.   Not all test results are available during your visit. If your test results are not back during the visit, make an appointment with your health care provider to find out the results. Do not assume everything is normal if you have not heard from your health care provider or the medical facility. It is your responsibility to get your test results.  If you test positive for gonorrhea, inform your recent sexual partners. They need to be checked for gonorrhea even if they do not have symptoms. They may need treatment, even if they test negative for gonorrhea.  SEEK MEDICAL CARE IF:   You develop any bad reaction to the medicine you were prescribed. This may  include:   A rash.   Nausea.   Vomiting.   Diarrhea.   Your symptoms do not improve after a few days of taking antibiotics.   Your symptoms get worse.   You develop increased pain, such as in the testicles (for males) or in the abdomen (for females).  You have a fever. MAKE SURE YOU:   Understand these instructions.  Will watch your condition.  Will get help right away if you are not doing well or get worse.   This information is not intended to replace advice given to you by your health care provider. Make sure you discuss any questions you have with your health care provider.   Document Released: 08/04/2000 Document Revised: 08/28/2014 Document Reviewed: 02/12/2013 Elsevier Interactive Patient Education [employer]

## 2016-02-01 NOTE — ED Notes (Signed)
Pt states she was told to come to ED due to a positive gonorrhea test. States she was seen last week for a UTI and had labs performed. Pt also reports vaginal spotting, but thinks it could be her period.

## 2016-10-31 ENCOUNTER — Other Ambulatory Visit: Payer: Self-pay | Admitting: Obstetrics & Gynecology

## 2016-10-31 DIAGNOSIS — O3680X0 Pregnancy with inconclusive fetal viability, not applicable or unspecified: Secondary | ICD-10-CM

## 2016-11-01 ENCOUNTER — Telehealth: Payer: Self-pay | Admitting: *Deleted

## 2016-11-01 ENCOUNTER — Ambulatory Visit (INDEPENDENT_AMBULATORY_CARE_PROVIDER_SITE_OTHER): Payer: BLUE CROSS/BLUE SHIELD

## 2016-11-01 ENCOUNTER — Other Ambulatory Visit: Payer: Self-pay | Admitting: Advanced Practice Midwife

## 2016-11-01 DIAGNOSIS — Z3A08 8 weeks gestation of pregnancy: Secondary | ICD-10-CM

## 2016-11-01 DIAGNOSIS — O3680X0 Pregnancy with inconclusive fetal viability, not applicable or unspecified: Secondary | ICD-10-CM | POA: Diagnosis not present

## 2016-11-01 MED ORDER — DOXYLAMINE-PYRIDOXINE 10-10 MG PO TBEC: DELAYED_RELEASE_TABLET | ORAL | 3 refills | Status: DC

## 2016-11-01 NOTE — Progress Notes (Signed)
diclegis sent for nausea

## 2016-11-01 NOTE — Telephone Encounter (Signed)
Pt was here today for dating u/s and asked if she could get something for nausea called into [facility] in [place].  Pt has [employer] insurance and has not had her new OB appointment yet.

## 2016-11-01 NOTE — Progress Notes (Addendum)
US 7+4 wks,single IUP w/ys, pos fht 150 bpm,normal ov's bilat,crl 12.47 mm,edd 06/16/2017 by LMP

## 2016-11-01 NOTE — Telephone Encounter (Signed)
rx sent to pharmacy

## 2016-11-02 NOTE — Telephone Encounter (Signed)
LM on VM that RX was sent to pharmacy.

## 2016-11-20 ENCOUNTER — Encounter: Payer: Self-pay | Admitting: Women's Health

## 2016-11-20 ENCOUNTER — Ambulatory Visit (INDEPENDENT_AMBULATORY_CARE_PROVIDER_SITE_OTHER): Payer: BLUE CROSS/BLUE SHIELD | Admitting: Women's Health

## 2016-11-20 ENCOUNTER — Ambulatory Visit: Payer: BLUE CROSS/BLUE SHIELD | Admitting: *Deleted

## 2016-11-20 VITALS — BP 118/76 | HR 78 | Wt 274.0 lb

## 2016-11-20 DIAGNOSIS — Z8742 Personal history of other diseases of the female genital tract: Secondary | ICD-10-CM

## 2016-11-20 DIAGNOSIS — Z8751 Personal history of pre-term labor: Secondary | ICD-10-CM | POA: Insufficient documentation

## 2016-11-20 DIAGNOSIS — O34219 Maternal care for unspecified type scar from previous cesarean delivery: Secondary | ICD-10-CM

## 2016-11-20 DIAGNOSIS — Z98891 History of uterine scar from previous surgery: Secondary | ICD-10-CM | POA: Insufficient documentation

## 2016-11-20 DIAGNOSIS — Z3A10 10 weeks gestation of pregnancy: Secondary | ICD-10-CM

## 2016-11-20 DIAGNOSIS — O09899 Supervision of other high risk pregnancies, unspecified trimester: Secondary | ICD-10-CM

## 2016-11-20 DIAGNOSIS — O09219 Supervision of pregnancy with history of pre-term labor, unspecified trimester: Secondary | ICD-10-CM

## 2016-11-20 DIAGNOSIS — Z3481 Encounter for supervision of other normal pregnancy, first trimester: Secondary | ICD-10-CM | POA: Diagnosis not present

## 2016-11-20 DIAGNOSIS — Z1389 Encounter for screening for other disorder: Secondary | ICD-10-CM

## 2016-11-20 DIAGNOSIS — Z3490 Encounter for supervision of normal pregnancy, unspecified, unspecified trimester: Secondary | ICD-10-CM | POA: Insufficient documentation

## 2016-11-20 DIAGNOSIS — Z331 Pregnant state, incidental: Secondary | ICD-10-CM

## 2016-11-20 DIAGNOSIS — Z3682 Encounter for antenatal screening for nuchal translucency: Secondary | ICD-10-CM

## 2016-11-20 LAB — POCT URINALYSIS DIPSTICK
Blood, UA: NEGATIVE
GLUCOSE UA: NEGATIVE
Ketones, UA: NEGATIVE
Nitrite, UA: NEGATIVE
Protein, UA: NEGATIVE

## 2016-11-20 NOTE — Progress Notes (Signed)
  Subjective:  Denise Buck is a 26 y.o. G3P1101 Caucasian female at [redacted]w[redacted]d by LMP c/w 7wk u/s, being seen today for her first obstetrical visit.  Her obstetrical history is significant for c/s for FTP @ 5cm after elective term IOL- was told d/t double nuchal, then 23wk VBAC after pprom/labor w/ neonatal demise.  Pregnancy history fully reviewed.  Patient reports some n/v, just filled diclegis yesterday. Denies vb, cramping, uti s/s, abnormal/malodorous vag d/c, or vulvovaginal itching/irritation.  BP 118/76   Pulse 78   Wt 274 lb (124.3 kg)   LMP 09/09/2016 (Exact Date)   BMI 42.91 kg/m   HISTORY: OB History  Gravida Para Term Preterm AB Living  SAB TAB Ectopic Multiple Live Births          2    # Outcome Date GA Lbr Len/2nd Weight Sex Delivery Anes PTL Lv  3 Current           2 Preterm 01/14/15 [redacted]w[redacted]d    VBAC EPI Y ND  1 Term 06/27/11 [redacted]w[redacted]d  8 lb 2 oz (3.685 kg) M CS-LTranv EPI N LIV     Complications: Failure to Progress in First Stage     Past Medical History:  Diagnosis Date  . Chlamydia    Past Surgical History:  Procedure Laterality Date  . CESAREAN SECTION    . CHOLECYSTECTOMY    . WISDOM TOOTH EXTRACTION     Family History  Problem Relation Age of Onset  . Autism Maternal Uncle   . Cancer Paternal Aunt     breast cancer  . Cancer Paternal Grandmother     breast cancer    Exam   System:     General: Well developed & nourished, no acute distress   Skin: Warm & dry, normal coloration and turgor, no rashes   Neurologic: Alert & oriented, normal mood   Cardiovascular: Regular rate & rhythm   Respiratory: Effort & rate normal, LCTAB, acyanotic   Abdomen: Soft, non tender   Extremities: normal strength, tone  Thin prep pap smear 2016, neg  FHR: 175 via doppler   Assessment:   Pregnancy: [id number] Patient Active Problem List   Diagnosis Date Noted  . Encounter for supervision of other normal pregnancy 11/20/2016  . History of vaginal  delivery following previous cesarean delivery 11/20/2016  . History of preterm delivery, currently pregnant 11/20/2016  . Previous cesarean section complicating pregnancy 11/20/2016    [redacted]w[redacted]d G3P1101 New OB visit H/O c/s for FTP after elective term IOL H/O 23wk VBAC after pprom/labor w/ neonatal demise N/V of pregnancy  Plan:  Pt has to leave d/t mom having appt to be at, will come back today for labs, will order Makena at next visit Initial labs obtained Continue prenatal vitamins Problem list reviewed and updated Reviewed n/v relief measures and warning s/s to report Reviewed recommended weight gain based on pre-gravid BMI Encouraged well-balanced diet Genetic Screening discussed Integrated Screen: requested Cystic fibrosis screening discussed declined Ultrasound discussed; fetal survey: requested Follow up in 2 weeks for 1st it/nt and visit, order Makena CCNC completed-will meet w/ PCM next visit Offered VBAC, gave consent to take home and review Request records from previous c/s  Marge Duncans CNM, [facility] 11/20/2016 9:48 AM

## 2016-11-21 ENCOUNTER — Encounter: Payer: Self-pay | Admitting: Women's Health

## 2016-11-21 DIAGNOSIS — F1290 Cannabis use, unspecified, uncomplicated: Secondary | ICD-10-CM | POA: Insufficient documentation

## 2016-11-21 LAB — PMP SCREEN PROFILE (10S), URINE
Amphetamine Screen, Ur: NEGATIVE ng/mL
BARBITURATE SCRN UR: NEGATIVE ng/mL
Benzodiazepine Screen, Urine: NEGATIVE ng/mL
CANNABINOIDS UR QL SCN: POSITIVE ng/mL
COCAINE(METAB.) SCREEN, URINE: NEGATIVE ng/mL
Creatinine(Crt), U: 69.6 mg/dL (ref 20.0–300.0)
METHADONE SCREEN, URINE: NEGATIVE ng/mL
OPIATE SCRN UR: NEGATIVE ng/mL
Oxycodone+Oxymorphone Ur Ql Scn: NEGATIVE ng/mL
PCP Scrn, Ur: NEGATIVE ng/mL
Ph of Urine: 5.8 (ref 4.5–8.9)
Propoxyphene, Screen: NEGATIVE ng/mL

## 2016-11-21 LAB — RPR: RPR Ser Ql: NONREACTIVE

## 2016-11-21 LAB — MICROSCOPIC EXAMINATION: CASTS: NONE SEEN /LPF

## 2016-11-21 LAB — URINALYSIS, ROUTINE W REFLEX MICROSCOPIC
BILIRUBIN UA: NEGATIVE
Glucose, UA: NEGATIVE
Ketones, UA: NEGATIVE
NITRITE UA: NEGATIVE
PH UA: 6 (ref 5.0–7.5)
PROTEIN UA: NEGATIVE
RBC UA: NEGATIVE
SPEC GRAV UA: 1.011 (ref 1.005–1.030)
UUROB: 0.2 mg/dL (ref 0.2–1.0)

## 2016-11-21 LAB — ABO/RH: Rh Factor: POSITIVE

## 2016-11-21 LAB — CBC
HEMOGLOBIN: 13.9 g/dL (ref 11.1–15.9)
Hematocrit: 40.4 % (ref 34.0–46.6)
MCH: 29.4 pg (ref 26.6–33.0)
MCHC: 34.4 g/dL (ref 31.5–35.7)
MCV: 85 fL (ref 79–97)
Platelets: 220 10*3/uL (ref 150–379)
RBC: 4.73 x10E6/uL (ref 3.77–5.28)
RDW: 14.4 % (ref 12.3–15.4)
WBC: 8.9 10*3/uL (ref 3.4–10.8)

## 2016-11-21 LAB — GC/CHLAMYDIA PROBE AMP
Chlamydia trachomatis, NAA: NEGATIVE
Neisseria gonorrhoeae by PCR: NEGATIVE

## 2016-11-21 LAB — HIV ANTIBODY (ROUTINE TESTING W REFLEX): HIV SCREEN 4TH GENERATION: NONREACTIVE

## 2016-11-21 LAB — ANTIBODY SCREEN: Antibody Screen: NEGATIVE

## 2016-11-21 LAB — HEPATITIS B SURFACE ANTIGEN: Hepatitis B Surface Ag: NEGATIVE

## 2016-11-21 LAB — RUBELLA SCREEN: RUBELLA: 2.42 {index} (ref >0.99)

## 2016-11-21 LAB — VARICELLA ZOSTER ANTIBODY, IGG: VARICELLA: 1056 {index} (ref >165)

## 2016-11-22 LAB — URINE CULTURE

## 2016-12-04 ENCOUNTER — Encounter: Payer: Self-pay | Admitting: Women's Health

## 2016-12-04 ENCOUNTER — Ambulatory Visit (INDEPENDENT_AMBULATORY_CARE_PROVIDER_SITE_OTHER): Payer: BLUE CROSS/BLUE SHIELD

## 2016-12-04 ENCOUNTER — Ambulatory Visit (INDEPENDENT_AMBULATORY_CARE_PROVIDER_SITE_OTHER): Payer: BLUE CROSS/BLUE SHIELD | Admitting: Women's Health

## 2016-12-04 VITALS — BP 100/60 | HR 78 | Wt 268.0 lb

## 2016-12-04 DIAGNOSIS — O09211 Supervision of pregnancy with history of pre-term labor, first trimester: Secondary | ICD-10-CM

## 2016-12-04 DIAGNOSIS — Z331 Pregnant state, incidental: Secondary | ICD-10-CM

## 2016-12-04 DIAGNOSIS — Z3481 Encounter for supervision of other normal pregnancy, first trimester: Secondary | ICD-10-CM

## 2016-12-04 DIAGNOSIS — F1290 Cannabis use, unspecified, uncomplicated: Secondary | ICD-10-CM

## 2016-12-04 DIAGNOSIS — Z3682 Encounter for antenatal screening for nuchal translucency: Secondary | ICD-10-CM | POA: Diagnosis not present

## 2016-12-04 DIAGNOSIS — Z1389 Encounter for screening for other disorder: Secondary | ICD-10-CM

## 2016-12-04 DIAGNOSIS — O09899 Supervision of other high risk pregnancies, unspecified trimester: Secondary | ICD-10-CM

## 2016-12-04 DIAGNOSIS — Z3A12 12 weeks gestation of pregnancy: Secondary | ICD-10-CM

## 2016-12-04 DIAGNOSIS — O09219 Supervision of pregnancy with history of pre-term labor, unspecified trimester: Secondary | ICD-10-CM

## 2016-12-04 LAB — POCT URINALYSIS DIPSTICK
GLUCOSE UA: NEGATIVE
Ketones, UA: NEGATIVE
NITRITE UA: NEGATIVE
Protein, UA: NEGATIVE

## 2016-12-04 NOTE — Patient Instructions (Signed)
Subchorionic Hematoma °A subchorionic hematoma is a gathering of blood between the outer wall of the placenta and the inner wall of the womb (uterus). The placenta is the organ that connects the fetus to the wall of the uterus. The placenta performs the feeding, breathing (oxygen to the fetus), and waste removal (excretory work) of the fetus. °Subchorionic hematoma is the most common abnormality found on a result from ultrasonography done during the first trimester or early second trimester of pregnancy. If there has been little or no vaginal bleeding, early small hematomas usually shrink on their own and do not affect your baby or pregnancy. The blood is gradually absorbed over 1-2 weeks. When bleeding starts later in pregnancy or the hematoma is larger or occurs in an older pregnant woman, the outcome may not be as good. Larger hematomas may get bigger, which increases the chances for miscarriage. Subchorionic hematoma also increases the risk of premature detachment of the placenta from the uterus, preterm (premature) labor, and stillbirth. °Follow these instructions at home: °· Stay on bed rest if your health care provider recommends this. Although bed rest will not prevent more bleeding or prevent a miscarriage, your health care provider may recommend bed rest until you are advised otherwise. °· Avoid heavy lifting (more than 10 lb [4.5 kg]), exercise, sexual intercourse, or douching as directed by your health care provider. °· Keep track of the number of pads you use each day and how soaked (saturated) they are. Write down this information. °· Do not use tampons. °· Keep all follow-up appointments as directed by your health care provider. Your health care provider may ask you to have follow-up blood tests or ultrasound tests or both. °Get help right away if: °· You have severe cramps in your stomach, back, abdomen, or pelvis. °· You have a fever. °· You pass large clots or tissue. Save any tissue for your  health care provider to look at. °· Your bleeding increases or you become lightheaded, feel weak, or have fainting episodes. °This information is not intended to replace advice given to you by your health care provider. Make sure you discuss any questions you have with your health care provider. °Document Released: 11/22/2006 Document Revised: 01/13/2016 Document Reviewed: 03/06/2013 °Elsevier Interactive Patient Education © 2017 Else[employer] Inc. ° °

## 2016-12-04 NOTE — Progress Notes (Signed)
Low-risk OB appointment G3P1101 [redacted]w[redacted]d Estimated Date of Delivery: 06/16/17 BP 100/60   Pulse 78   Wt 268 lb (121.6 kg)   LMP 09/09/2016 (Exact Date)   BMI 41.97 kg/m   BP, weight, and urine reviewed.  Refer to obstetrical flow sheet for FH & FHR.  No fm yet. Denies cramping, lof, vb, or uti s/s. No complaints. NO THC in 3wks, advised no more.  Reviewed today's nt u/s, nasal bone present, nt normal, SCH 5.9cmx1.7x.9 Denies vb. Discussed and gave printed info. Plan:  Continue routine obstetrical care  F/U in 4wks for OB appointment, 2nd IT, and begin weekly Makena at that time [facility] ordered today

## 2016-12-04 NOTE — Progress Notes (Signed)
[place] 12+2 wks,measurements c/w dates,normal ovaries bilat,NB present,NT 1.4 mm,crl 57.95 mm,subchorionic hemorrhage 5.9 x 1.7 x .9 cm,fhr 155 bpm

## 2016-12-06 LAB — MATERNAL SCREEN, INTEGRATED #1
CROWN RUMP LENGTH MAT SCREEN: 57.9 mm
GEST. AGE ON COLLECTION DATE: 12.3 wk
MATERNAL AGE AT EDD: 25 a
NUCHAL TRANSLUCENCY (NT): 1.4 mm
NUMBER OF FETUSES: 1
PAPP-A Value: 610.1 ng/mL
WEIGHT: 268 [lb_av]

## 2016-12-11 ENCOUNTER — Telehealth: Payer: Self-pay | Admitting: *Deleted

## 2016-12-11 ENCOUNTER — Encounter: Payer: Self-pay | Admitting: Women's Health

## 2016-12-11 NOTE — Telephone Encounter (Signed)
Makena called for insurance information regarding PA. Will complete and fax

## 2016-12-19 ENCOUNTER — Telehealth: Payer: Self-pay | Admitting: *Deleted

## 2016-12-19 NOTE — Telephone Encounter (Signed)
Spoke to [place] at [place] who stated PA needed to be refaxed. Will fill out and refax info.

## 2016-12-28 ENCOUNTER — Telehealth: Payer: Self-pay | Admitting: *Deleted

## 2016-12-28 NOTE — Telephone Encounter (Signed)
Called to schedule Denise Buck delivery. Will deliver 01/02/17. Patient needs to sign and mail back patient info form.

## 2016-12-30 ENCOUNTER — Encounter: Payer: Self-pay | Admitting: Women's Health

## 2017-01-01 ENCOUNTER — Encounter: Payer: BLUE CROSS/BLUE SHIELD | Admitting: Women's Health

## 2017-01-03 DIAGNOSIS — B9689 Other specified bacterial agents as the cause of diseases classified elsewhere: Secondary | ICD-10-CM | POA: Diagnosis not present

## 2017-01-03 DIAGNOSIS — O98312 Other infections with a predominantly sexual mode of transmission complicating pregnancy, second trimester: Secondary | ICD-10-CM | POA: Diagnosis not present

## 2017-01-03 DIAGNOSIS — A64 Unspecified sexually transmitted disease: Secondary | ICD-10-CM | POA: Insufficient documentation

## 2017-01-03 DIAGNOSIS — Z79899 Other long term (current) drug therapy: Secondary | ICD-10-CM | POA: Insufficient documentation

## 2017-01-03 DIAGNOSIS — Z3A16 16 weeks gestation of pregnancy: Secondary | ICD-10-CM | POA: Insufficient documentation

## 2017-01-03 DIAGNOSIS — O0281 Inappropriate change in quantitative human chorionic gonadotropin (hCG) in early pregnancy: Secondary | ICD-10-CM | POA: Diagnosis not present

## 2017-01-03 DIAGNOSIS — O23592 Infection of other part of genital tract in pregnancy, second trimester: Secondary | ICD-10-CM | POA: Insufficient documentation

## 2017-01-03 DIAGNOSIS — Z87891 Personal history of nicotine dependence: Secondary | ICD-10-CM | POA: Diagnosis not present

## 2017-01-03 DIAGNOSIS — O26892 Other specified pregnancy related conditions, second trimester: Secondary | ICD-10-CM | POA: Diagnosis present

## 2017-01-03 LAB — BASIC METABOLIC PANEL
ANION GAP: 6 (ref 5–15)
BUN: 11 mg/dL (ref 6–20)
CALCIUM: 9 mg/dL (ref 8.9–10.3)
CO2: 23 mmol/L (ref 22–32)
CREATININE: 0.75 mg/dL (ref 0.44–1.00)
Chloride: 108 mmol/L (ref 101–111)
GFR calc Af Amer: >60 mL/min (ref >60)
GFR calc non Af Amer: >60 mL/min (ref >60)
GLUCOSE: 89 mg/dL (ref 65–99)
Potassium: 3.6 mmol/L (ref 3.5–5.1)
Sodium: 137 mmol/L (ref 135–145)

## 2017-01-03 LAB — URINALYSIS, COMPLETE (UACMP) WITH MICROSCOPIC
BILIRUBIN URINE: NEGATIVE
Glucose, UA: NEGATIVE mg/dL
Hgb urine dipstick: NEGATIVE
Ketones, ur: NEGATIVE mg/dL
Nitrite: NEGATIVE
Protein, ur: NEGATIVE mg/dL
SPECIFIC GRAVITY, URINE: 1.033 — AB (ref 1.005–1.030)
pH: 5 (ref 5.0–8.0)

## 2017-01-03 LAB — CBC
HCT: 37.3 % (ref 35.0–47.0)
Hemoglobin: 13 g/dL (ref 12.0–16.0)
MCH: 30.1 pg (ref 26.0–34.0)
MCHC: 35 g/dL (ref 32.0–36.0)
MCV: 86.1 fL (ref 80.0–100.0)
PLATELETS: 262 10*3/uL (ref 150–440)
RBC: 4.33 MIL/uL (ref 3.80–5.20)
RDW: 14.6 % — AB (ref 11.5–14.5)
WBC: 11 10*3/uL (ref 3.6–11.0)

## 2017-01-03 LAB — HCG, QUANTITATIVE, PREGNANCY: HCG, BETA CHAIN, QUANT, S: 16874 m[IU]/mL — AB (ref <5)

## 2017-01-03 NOTE — ED Triage Notes (Signed)
Pt in with co abd cramping that started today, states is [redacted] weeks pregnant. Denies any vaginal bleeding, or dysuria.

## 2017-01-04 ENCOUNTER — Emergency Department
Admission: EM | Admit: 2017-01-04 | Discharge: 2017-01-04 | Disposition: A | Discharge status: Home / Self Care | Payer: BLUE CROSS/BLUE SHIELD | Attending: Emergency Medicine | Admitting: Emergency Medicine

## 2017-01-04 ENCOUNTER — Emergency Department: Payer: BLUE CROSS/BLUE SHIELD

## 2017-01-04 DIAGNOSIS — N760 Acute vaginitis: Secondary | ICD-10-CM

## 2017-01-04 DIAGNOSIS — A599 Trichomoniasis, unspecified: Secondary | ICD-10-CM

## 2017-01-04 DIAGNOSIS — R102 Pelvic and perineal pain: Secondary | ICD-10-CM

## 2017-01-04 DIAGNOSIS — Z3A16 16 weeks gestation of pregnancy: Secondary | ICD-10-CM

## 2017-01-04 DIAGNOSIS — B9689 Other specified bacterial agents as the cause of diseases classified elsewhere: Secondary | ICD-10-CM

## 2017-01-04 DIAGNOSIS — A64 Unspecified sexually transmitted disease: Secondary | ICD-10-CM

## 2017-01-04 LAB — CHLAMYDIA/NGC RT PCR (ARMC ONLY)
Chlamydia Tr: NOT DETECTED
N gonorrhoeae: NOT DETECTED

## 2017-01-04 LAB — WET PREP, GENITAL
Sperm: NONE SEEN
Yeast Wet Prep HPF POC: NONE SEEN

## 2017-01-04 MED ORDER — ONDANSETRON 4 MG PO TBDP
4.0000 mg | ORAL_TABLET | Freq: Once | ORAL | Status: AC
  Administered 2017-01-04: 4 mg via ORAL
  Filled 2017-01-04: qty 1

## 2017-01-04 MED ORDER — AZITHROMYCIN 1 G PO PACK
1.0000 g | PACK | Freq: Once | ORAL | Status: AC
  Administered 2017-01-04: 1 g via ORAL
  Filled 2017-01-04: qty 1

## 2017-01-04 MED ORDER — CEFTRIAXONE SODIUM 250 MG IJ SOLR
250.0000 mg | Freq: Once | INTRAMUSCULAR | Status: AC
  Administered 2017-01-04: 250 mg via INTRAMUSCULAR
  Filled 2017-01-04: qty 250

## 2017-01-04 MED ORDER — METRONIDAZOLE 500 MG PO TABS
2000.0000 mg | ORAL_TABLET | Freq: Once | ORAL | Status: AC
  Administered 2017-01-04: 2000 mg via ORAL
  Filled 2017-01-04: qty 4

## 2017-01-04 NOTE — ED Notes (Signed)
Pt discharged to home.  Discharge instructions reviewed.  Verbalized understanding.  No questions or concerns at this time.  Teach back verified.  Pt in NAD.  No items left in ED.   

## 2017-01-04 NOTE — Discharge Instructions (Signed)
You have been treated with antibiotics while in the emergency department. Please inform your sexual partner to seek treatment at the [facility]. Return to the ER for worsening symptoms, persistent vomiting, fever or other concerns.

## 2017-01-04 NOTE — ED Notes (Signed)
Pt diagnosed with chorionic hemorrhage approx 1 month ago at ultrasound.  Was told by OBGYN if she had any bleeding or cramping to come to ER.  Pt reports having cramping since 1230pm yesterday, with no bleeding or discharge.

## 2017-01-04 NOTE — ED Provider Notes (Signed)
[facility] Emergency Department Provider Note   ____________________________________________   First MD Initiated Contact with Patient 01/04/17 [phone]     (approximate)  I have reviewed the triage vital signs and the nursing notes.   HISTORY  Chief Complaint Abdominal Pain    HPI Denise Buck is a 26 y.o. female G3 P2 (second child born at 10 weeks who did not survive) approximately [redacted] weeks pregnant who presents with a one-day history of pelvic cramping. Denies nausea/vomiting, dysuria, vaginal bleeding or discharge.Patient's OB is in [place] where she is from, but she works in [employer] so came after work for evaluation. Last sexual intercourse over 2 months ago. Denies recent fever, chills, chest pain, shortness of breath, diarrhea. Denies recent travel or trauma. Nothing makes her symptoms better or worse.   Past Medical History:  Diagnosis Date  . Chlamydia     Patient Active Problem List   Diagnosis Date Noted  . Marijuana use 11/21/2016  . Encounter for supervision of other normal pregnancy 11/20/2016  . History of vaginal delivery following previous cesarean delivery 11/20/2016  . History of preterm delivery, currently pregnant 11/20/2016  . Previous cesarean section complicating pregnancy 11/20/2016    Past Surgical History:  Procedure Laterality Date  . CESAREAN SECTION    . CHOLECYSTECTOMY    . WISDOM TOOTH EXTRACTION      Prior to Admission medications   Medication Sig Start Date End Date Taking? Authorizing Provider  acetaminophen (TYLENOL) 500 MG tablet Take 1,000 mg by mouth every 6 (six) hours as needed for moderate pain.    [provider]  Doxylamine-Pyridoxine 10-10 MG TBEC 2 PO qhs; may take 1po in am and 1po in afternoon prn nausea 11/01/16   Jacklyn Shell, CNM  Pediatric Multiple Vit-C-FA (FLINSTONES GUMMIES OMEGA-3 DHA) CHEW Chew by mouth. Takes 2 daily    [provider]     Allergies Hydrocodone  Family History  Problem Relation Age of Onset  . Autism Maternal Uncle   . Cancer Paternal Aunt        breast cancer  . Cancer Paternal Grandmother        breast cancer    Social History Social History  Substance Use Topics  . Smoking status: Former Games developer  . Smokeless tobacco: Never Used  . Alcohol use No     Comment: occ; not now    Review of Systems  Constitutional: No fever/chills. Eyes: No visual changes. ENT: No sore throat. Cardiovascular: Denies chest pain. Respiratory: Denies shortness of breath. Gastrointestinal: Positive for pelvic cramping. No abdominal pain.  No nausea, no vomiting.  No diarrhea.  No constipation. Genitourinary: Negative for vaginal bleeding. Negative for dysuria. Musculoskeletal: Negative for back pain. Skin: Negative for rash. Neurological: Negative for headaches, focal weakness or numbness.   ____________________________________________   PHYSICAL EXAM:  VITAL SIGNS: ED Triage Vitals  Enc Vitals Group     BP 01/03/17 2129 (!) 135/101     Pulse Rate 01/03/17 2123 93     Resp 01/03/17 2123 20     Temp 01/03/17 2123 99 F (37.2 C)     Temp Source 01/03/17 2123 Oral     SpO2 01/03/17 2123 99 %     Weight 01/03/17 2123 260 lb (117.9 kg)     Height 01/03/17 2123 5\' 7"  (1.702 m)     Head Circumference --      Peak Flow --      Pain Score 01/03/17 2122 7  Pain Loc --      Pain Edu? --      Excl. in GC? --     Constitutional: Alert and oriented. Well appearing and in no acute distress. Eyes: Conjunctivae are normal. PERRL. EOMI. Head: Atraumatic. Nose: No congestion/rhinnorhea. Mouth/Throat: Mucous membranes are moist.  Oropharynx non-erythematous. Neck: No stridor.   Cardiovascular: Normal rate, regular rhythm. Grossly normal heart sounds.  Good peripheral circulation. Respiratory: Normal respiratory effort.  No retractions. Lungs CTAB. Gastrointestinal: Soft and nontender to light or deep  palpation. No distention. No abdominal bruits. No CVA tenderness. Musculoskeletal: No lower extremity tenderness nor edema.  No joint effusions. Neurologic:  Normal speech and language. No gross focal neurologic deficits are appreciated. No gait instability. Skin:  Skin is warm, dry and intact. No rash noted. Psychiatric: Mood and affect are normal. Speech and behavior are normal.  ____________________________________________   LABS (all labs ordered are listed, but only abnormal results are displayed)  Labs Reviewed  WET PREP, GENITAL - Abnormal; Notable for the following:       Result Value   Trich, Wet Prep PRESENT (*)    Clue Cells Wet Prep HPF POC PRESENT (*)    WBC, Wet Prep HPF POC MODERATE (*)    All other components within normal limits  CBC - Abnormal; Notable for the following:    RDW 14.6 (*)    All other components within normal limits  URINALYSIS, COMPLETE (UACMP) WITH MICROSCOPIC - Abnormal; Notable for the following:    Color, Urine YELLOW (*)    APPearance HAZY (*)    Specific Gravity, Urine 1.033 (*)    Leukocytes, UA TRACE (*)    Bacteria, UA RARE (*)    Squamous Epithelial / LPF 6-30 (*)    All other components within normal limits  HCG, QUANTITATIVE, PREGNANCY - Abnormal; Notable for the following:    hCG, Beta Chain, Quant, S 16,[id number] (*)    All other components within normal limits  CHLAMYDIA/NGC RT PCR ([facility] ONLY)  BASIC METABOLIC PANEL   ____________________________________________  EKG  None ____________________________________________  RADIOLOGY  OB ultrasound interpreted per Dr. Manus GunningEhinger: Single live intrauterine pregnancy gestational age [redacted] weeks 5 days.  No complications.   ____________________________________________   PROCEDURES  Procedure(s) performed:   Pelvic exam: External exam WNL without rashes, lesions or vesicles. Speculum exam reveals mild discharge. No active bleeding. Cervical os closed. Bimanual exam  WNL.  Procedures  Critical Care performed: No  ____________________________________________   INITIAL IMPRESSION / ASSESSMENT AND PLAN / ED COURSE  Pertinent labs & imaging results that were available during my care of the patient were reviewed by me and considered in my medical decision making (see chart for details).  26 year old female approximately [redacted] weeks pregnant presents with pelvic cramping. Reports ultrasound 1 month ago which showed subchorionic hemorrhage. Patient reports no vaginal bleeding during this pregnancy. Laboratory results are unremarkable. Trace leukocytes and urinalysis. Will proceed with pelvic ultrasound.  Clinical Course as of Jan 05 535  Thu Jan 04, 2017  [id number] Updated patient on ultrasound results. Wet prep is positive for Trichomonas as well as bacterial vaginosis. Will treat for all STDs and patient will follow-up with her OB next week. Strict return precautions given. Patient verbalizes understanding and agrees with plan of care.  [JS]  0536 Chart review: Noted patient was negative for chlamydia and gonorrhea.  [JS]    Clinical Course User Index [JS] Irean HongSung, Jade J, MD     ____________________________________________   FINAL  CLINICAL IMPRESSION(S) / ED DIAGNOSES  Final diagnoses:  Pelvic pain in female  [redacted] weeks gestation of pregnancy  Trichomonas infection  Bacterial vaginosis  STD (female)      NEW MEDICATIONS STARTED DURING THIS VISIT:  Discharge Medication List as of 01/04/2017  3:44 AM       Note:  This document was prepared using Dragon voice recognition software and may include unintentional dictation errors.    Irean Hong, MD 01/04/17 [phone]

## 2017-01-08 ENCOUNTER — Ambulatory Visit (INDEPENDENT_AMBULATORY_CARE_PROVIDER_SITE_OTHER): Payer: BLUE CROSS/BLUE SHIELD | Admitting: Women's Health

## 2017-01-08 ENCOUNTER — Encounter: Payer: Self-pay | Admitting: Women's Health

## 2017-01-08 VITALS — BP 122/74 | HR 91 | Wt 279.0 lb

## 2017-01-08 DIAGNOSIS — Z3682 Encounter for antenatal screening for nuchal translucency: Secondary | ICD-10-CM

## 2017-01-08 DIAGNOSIS — O34219 Maternal care for unspecified type scar from previous cesarean delivery: Secondary | ICD-10-CM

## 2017-01-08 DIAGNOSIS — O09212 Supervision of pregnancy with history of pre-term labor, second trimester: Secondary | ICD-10-CM

## 2017-01-08 DIAGNOSIS — Z363 Encounter for antenatal screening for malformations: Secondary | ICD-10-CM

## 2017-01-08 DIAGNOSIS — A599 Trichomoniasis, unspecified: Secondary | ICD-10-CM | POA: Insufficient documentation

## 2017-01-08 DIAGNOSIS — Z1389 Encounter for screening for other disorder: Secondary | ICD-10-CM

## 2017-01-08 DIAGNOSIS — Z8751 Personal history of pre-term labor: Secondary | ICD-10-CM

## 2017-01-08 DIAGNOSIS — Z3482 Encounter for supervision of other normal pregnancy, second trimester: Secondary | ICD-10-CM

## 2017-01-08 DIAGNOSIS — Z331 Pregnant state, incidental: Secondary | ICD-10-CM

## 2017-01-08 LAB — POCT URINALYSIS DIPSTICK
Blood, UA: NEGATIVE
GLUCOSE UA: NEGATIVE
KETONES UA: NEGATIVE
LEUKOCYTES UA: NEGATIVE
Nitrite, UA: NEGATIVE
PROTEIN UA: NEGATIVE

## 2017-01-08 MED ORDER — HYDROXYPROGESTERONE CAPROATE 275 MG/1.1ML ~~LOC~~ SOAJ
275.0000 mg | Freq: Once | SUBCUTANEOUS | Status: AC
  Administered 2017-01-08: 275 mg via SUBCUTANEOUS

## 2017-01-08 NOTE — Progress Notes (Signed)
Pt given Makena 275mg SQ left arm without complications. Advised pt to return in 1 week for next injection.  
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
## 2017-01-08 NOTE — Patient Instructions (Signed)

## 2017-01-10 LAB — MATERNAL SCREEN, INTEGRATED #2
AFP MoM: 1.11
Alpha-Fetoprotein: 23.4 ng/mL
Crown Rump Length: 57.9 mm
DIA MoM: 0.48
DIA VALUE: 56.1 pg/mL
ESTRIOL UNCONJUGATED: 0.86 ng/mL
Gest. Age on Collection Date: 12.3 weeks
Gestational Age: 17.3 weeks
HCG MOM: 0.9
HCG VALUE: 14.5 [IU]/mL
MATERNAL AGE AT EDD: 25 a
NUCHAL TRANSLUCENCY (NT): 1.4 mm
NUMBER OF FETUSES: 1
Nuchal Translucency MoM: 0.94
PAPP-A MoM: 1.48
PAPP-A Value: 610.1 ng/mL
Test Results:: NEGATIVE
WEIGHT: 268 [lb_av]
Weight: 279 [lb_av]
uE3 MoM: 1.05

## 2017-01-11 ENCOUNTER — Encounter: Payer: Self-pay | Admitting: Women's Health

## 2017-01-17 ENCOUNTER — Ambulatory Visit (INDEPENDENT_AMBULATORY_CARE_PROVIDER_SITE_OTHER): Payer: BLUE CROSS/BLUE SHIELD

## 2017-01-17 ENCOUNTER — Encounter: Payer: Self-pay | Admitting: Advanced Practice Midwife

## 2017-01-17 ENCOUNTER — Ambulatory Visit (INDEPENDENT_AMBULATORY_CARE_PROVIDER_SITE_OTHER): Payer: BLUE CROSS/BLUE SHIELD | Admitting: Advanced Practice Midwife

## 2017-01-17 DIAGNOSIS — Z363 Encounter for antenatal screening for malformations: Secondary | ICD-10-CM

## 2017-01-17 DIAGNOSIS — Z1389 Encounter for screening for other disorder: Secondary | ICD-10-CM

## 2017-01-17 DIAGNOSIS — O09891 Supervision of other high risk pregnancies, first trimester: Secondary | ICD-10-CM

## 2017-01-17 DIAGNOSIS — Z8751 Personal history of pre-term labor: Secondary | ICD-10-CM | POA: Diagnosis not present

## 2017-01-17 DIAGNOSIS — O09211 Supervision of pregnancy with history of pre-term labor, first trimester: Secondary | ICD-10-CM

## 2017-01-17 DIAGNOSIS — Z3482 Encounter for supervision of other normal pregnancy, second trimester: Secondary | ICD-10-CM

## 2017-01-17 DIAGNOSIS — Z3402 Encounter for supervision of normal first pregnancy, second trimester: Secondary | ICD-10-CM

## 2017-01-17 DIAGNOSIS — Z331 Pregnant state, incidental: Secondary | ICD-10-CM

## 2017-01-17 LAB — POCT URINALYSIS DIPSTICK
Blood, UA: NEGATIVE
GLUCOSE UA: NEGATIVE
KETONES UA: NEGATIVE
LEUKOCYTES UA: NEGATIVE
Nitrite, UA: NEGATIVE
PROTEIN UA: NEGATIVE

## 2017-01-17 MED ORDER — HYDROXYPROGESTERONE CAPROATE 275 MG/1.1ML ~~LOC~~ SOAJ
275.0000 mg | Freq: Once | SUBCUTANEOUS | Status: AC
  Administered 2017-01-17: 275 mg via SUBCUTANEOUS

## 2017-01-17 NOTE — Progress Notes (Signed)
[id number] 2075w4d Estimated Date of Delivery: 06/16/17  Blood pressure 120/60, pulse 60, weight 285 lb (129.3 kg), last menstrual period 09/09/2016.   BP weight and urine results all reviewed and noted.  US 18+4 wks,cephalic,cx 4 cm,post pl gr 0,normal ovaries bilat,svp of fluid 4.3 cm,fhr 150 bpm,efw 256 g,anatomy complete,no obvious abnormalities seen  Please refer to the obstetrical flow sheet for the fundal height and fetal heart rate documentation:  Patient reports good fetal movement, denies any bleeding and no rupture of membranes symptoms or regular contractions. Patient is without complaints. All questions were answered.  Orders Placed This Encounter  Procedures  . POCT urinalysis dipstick    Plan:  Continued routine obstetrical care, 17p today  Return for 17P Weekly; 4 weeks for LROB.

## 2017-01-17 NOTE — Progress Notes (Signed)
US 18+4 wks,cephalic,cx 4 cm,post pl gr 0,normal ovaries bilat,svp of fluid 4.3 cm,fhr 150 bpm,efw 256 g,anatomy complete,no obvious abnormalities seen

## 2017-01-17 NOTE — Patient Instructions (Signed)

## 2017-01-24 ENCOUNTER — Ambulatory Visit (INDEPENDENT_AMBULATORY_CARE_PROVIDER_SITE_OTHER): Payer: BLUE CROSS/BLUE SHIELD | Admitting: *Deleted

## 2017-01-24 ENCOUNTER — Encounter: Payer: Self-pay | Admitting: *Deleted

## 2017-01-24 VITALS — BP 102/58 | HR 86 | Ht 67.0 in | Wt 286.0 lb

## 2017-01-24 DIAGNOSIS — O09212 Supervision of pregnancy with history of pre-term labor, second trimester: Secondary | ICD-10-CM

## 2017-01-24 DIAGNOSIS — Z331 Pregnant state, incidental: Secondary | ICD-10-CM

## 2017-01-24 DIAGNOSIS — Z1389 Encounter for screening for other disorder: Secondary | ICD-10-CM | POA: Diagnosis not present

## 2017-01-24 DIAGNOSIS — O09892 Supervision of other high risk pregnancies, second trimester: Secondary | ICD-10-CM

## 2017-01-24 LAB — POCT URINALYSIS DIPSTICK
Blood, UA: NEGATIVE
Glucose, UA: NEGATIVE
KETONES UA: NEGATIVE
LEUKOCYTES UA: NEGATIVE
NITRITE UA: NEGATIVE

## 2017-01-24 MED ORDER — HYDROXYPROGESTERONE CAPROATE 275 MG/1.1ML ~~LOC~~ SOAJ
275.0000 mg | Freq: Once | SUBCUTANEOUS | Status: AC
  Administered 2017-01-24: 275 mg via SUBCUTANEOUS

## 2017-01-24 NOTE — Progress Notes (Signed)
Pt here for 17P. Pt tolerated shot well. Pt complains of having hemorrhoids. I advised to try something OTC first like Anusol, Anusol HC, Prep H or Tucks. If no relief, let us know. May need appt with provider. Return in 1 week for next 17P. JSY

## 2017-01-31 ENCOUNTER — Encounter: Payer: Self-pay | Admitting: *Deleted

## 2017-01-31 ENCOUNTER — Ambulatory Visit (INDEPENDENT_AMBULATORY_CARE_PROVIDER_SITE_OTHER): Payer: BLUE CROSS/BLUE SHIELD | Admitting: *Deleted

## 2017-01-31 VITALS — BP 110/58 | HR 80 | Wt 280.0 lb

## 2017-01-31 DIAGNOSIS — O09219 Supervision of pregnancy with history of pre-term labor, unspecified trimester: Secondary | ICD-10-CM

## 2017-01-31 DIAGNOSIS — Z3482 Encounter for supervision of other normal pregnancy, second trimester: Secondary | ICD-10-CM | POA: Diagnosis not present

## 2017-01-31 DIAGNOSIS — O09899 Supervision of other high risk pregnancies, unspecified trimester: Secondary | ICD-10-CM

## 2017-01-31 MED ORDER — HYDROXYPROGESTERONE CAPROATE 275 MG/1.1ML ~~LOC~~ SOAJ
275.0000 mg | Freq: Once | SUBCUTANEOUS | Status: AC
  Administered 2017-01-31: 275 mg via SUBCUTANEOUS

## 2017-01-31 NOTE — Progress Notes (Signed)
Makena was given in left arm with no complications. Patient to return in 1 week for next injection.

## 2017-02-02 ENCOUNTER — Telehealth: Payer: Self-pay | Admitting: *Deleted

## 2017-02-02 NOTE — Telephone Encounter (Signed)
LMOVM that I called Makena to reorder but they stated she needed to get back to them regarding her insurance. Advised to call them back so we could have her injection for next week and to let us know when it was taken care of.

## 2017-02-05 ENCOUNTER — Telehealth: Payer: Self-pay | Admitting: *Deleted

## 2017-02-05 NOTE — Telephone Encounter (Signed)
Denise Buck called to schedule delivery for auto injector for Tuesday

## 2017-02-07 ENCOUNTER — Ambulatory Visit: Payer: BLUE CROSS/BLUE SHIELD

## 2017-02-07 ENCOUNTER — Encounter: Payer: Self-pay | Admitting: Women's Health

## 2017-02-08 ENCOUNTER — Ambulatory Visit (INDEPENDENT_AMBULATORY_CARE_PROVIDER_SITE_OTHER): Payer: BLUE CROSS/BLUE SHIELD | Admitting: *Deleted

## 2017-02-08 ENCOUNTER — Encounter: Payer: Self-pay | Admitting: *Deleted

## 2017-02-08 VITALS — BP 122/66 | HR 80 | Wt 285.0 lb

## 2017-02-08 DIAGNOSIS — Z1389 Encounter for screening for other disorder: Secondary | ICD-10-CM

## 2017-02-08 DIAGNOSIS — Z8751 Personal history of pre-term labor: Secondary | ICD-10-CM

## 2017-02-08 DIAGNOSIS — Z331 Pregnant state, incidental: Secondary | ICD-10-CM | POA: Diagnosis not present

## 2017-02-08 LAB — POCT URINALYSIS DIPSTICK
GLUCOSE UA: NEGATIVE
Ketones, UA: NEGATIVE
Leukocytes, UA: NEGATIVE
NITRITE UA: NEGATIVE
Protein, UA: NEGATIVE
RBC UA: NEGATIVE

## 2017-02-08 MED ORDER — HYDROXYPROGESTERONE CAPROATE 275 MG/1.1ML ~~LOC~~ SOAJ
275.0000 mg | Freq: Once | SUBCUTANEOUS | Status: AC
  Administered 2017-02-08: 275 mg via SUBCUTANEOUS

## 2017-02-08 NOTE — Progress Notes (Signed)
Pt given Makena 275mg SQ right arm without complications. Advised pt to return in 1 week for next injection. Pt verbalized understanding 

## 2017-02-13 ENCOUNTER — Encounter: Payer: Self-pay | Admitting: Women's Health

## 2017-02-14 ENCOUNTER — Encounter: Payer: BLUE CROSS/BLUE SHIELD | Admitting: Women's Health

## 2017-02-15 ENCOUNTER — Encounter: Payer: Self-pay | Admitting: Advanced Practice Midwife

## 2017-02-15 ENCOUNTER — Ambulatory Visit (INDEPENDENT_AMBULATORY_CARE_PROVIDER_SITE_OTHER): Payer: BLUE CROSS/BLUE SHIELD | Admitting: Advanced Practice Midwife

## 2017-02-15 ENCOUNTER — Encounter: Payer: BLUE CROSS/BLUE SHIELD | Admitting: Obstetrics & Gynecology

## 2017-02-15 VITALS — BP 116/70 | HR 91 | Wt 288.0 lb

## 2017-02-15 DIAGNOSIS — Z1389 Encounter for screening for other disorder: Secondary | ICD-10-CM

## 2017-02-15 DIAGNOSIS — A599 Trichomoniasis, unspecified: Secondary | ICD-10-CM

## 2017-02-15 DIAGNOSIS — Z8751 Personal history of pre-term labor: Secondary | ICD-10-CM

## 2017-02-15 DIAGNOSIS — Z331 Pregnant state, incidental: Secondary | ICD-10-CM

## 2017-02-15 DIAGNOSIS — Z3482 Encounter for supervision of other normal pregnancy, second trimester: Secondary | ICD-10-CM

## 2017-02-15 LAB — POCT URINALYSIS DIPSTICK
Glucose, UA: NEGATIVE
Ketones, UA: NEGATIVE
LEUKOCYTES UA: NEGATIVE
NITRITE UA: NEGATIVE
PROTEIN UA: NEGATIVE
RBC UA: NEGATIVE

## 2017-02-15 MED ORDER — HYDROXYPROGESTERONE CAPROATE 275 MG/1.1ML ~~LOC~~ SOAJ
275.0000 mg | Freq: Once | SUBCUTANEOUS | Status: AC
  Administered 2017-02-15: 275 mg via SUBCUTANEOUS

## 2017-02-15 NOTE — Patient Instructions (Signed)

## 2017-02-15 NOTE — Progress Notes (Signed)
[id number] 1173w5d Estimated Date of Delivery: 06/16/17  Blood pressure 116/70, pulse 91, weight 288 lb (130.6 kg), last menstrual period 09/09/2016.   BP weight and urine results all reviewed and noted.  Please refer to the obstetrical flow sheet for the fundal height and fetal heart rate documentation:  Patient reports good fetal movement, denies any bleeding and no rupture of membranes symptoms or regular contractions. Patient is without complaints. All questions were answered.  Orders Placed This Encounter  Procedures  . Trichomonas vaginalis, RNA  . POCT urinalysis dipstick    Plan:  Continued routine obstetrical care, POC trich  Return for 17P Weekly and 4 weeks for LROB/PN2.

## 2017-02-15 NOTE — Progress Notes (Signed)
Pt given Makena 275mg SQ left arm without complications. Advised pt to return in 1 week for next injection.  

## 2017-02-17 LAB — TRICHOMONAS VAGINALIS, PROBE AMP: TRICH VAG BY NAA: POSITIVE — AB

## 2017-02-19 ENCOUNTER — Telehealth: Payer: Self-pay | Admitting: *Deleted

## 2017-02-19 DIAGNOSIS — A599 Trichomoniasis, unspecified: Secondary | ICD-10-CM

## 2017-02-19 MED ORDER — METRONIDAZOLE 500 MG PO TABS: 500.0000 mg | ORAL_TABLET | Freq: Two times a day (BID) | ORAL | 0 refills | Status: DC

## 2017-02-19 NOTE — Telephone Encounter (Signed)
Rx metronidazole 500mg  bid x 7d for both pt and partner Kwame Jiggetts, DOB 05/13/[birth year], NKDA to [facility] [place]. NO ETOH or sex while taking. No sex x at least 7d from the time they have both finished meds. POC for pt in few weeks.  Cheral MarkerKimberly R. Sherron Mapp, CNM, [facility]NP[facility]BC 02/19/2017 5:13 PM
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
## 2017-02-22 ENCOUNTER — Ambulatory Visit (INDEPENDENT_AMBULATORY_CARE_PROVIDER_SITE_OTHER): Payer: Medicaid Other | Admitting: *Deleted

## 2017-02-22 ENCOUNTER — Encounter: Payer: Self-pay | Admitting: *Deleted

## 2017-02-22 VITALS — BP 124/70 | HR 110 | Wt 288.4 lb

## 2017-02-22 DIAGNOSIS — Z8751 Personal history of pre-term labor: Secondary | ICD-10-CM

## 2017-02-22 DIAGNOSIS — Z331 Pregnant state, incidental: Secondary | ICD-10-CM | POA: Diagnosis not present

## 2017-02-22 DIAGNOSIS — O09212 Supervision of pregnancy with history of pre-term labor, second trimester: Secondary | ICD-10-CM

## 2017-02-22 DIAGNOSIS — Z1389 Encounter for screening for other disorder: Secondary | ICD-10-CM

## 2017-02-22 LAB — POCT URINALYSIS DIPSTICK
GLUCOSE UA: NEGATIVE
KETONES UA: NEGATIVE
Leukocytes, UA: NEGATIVE
NITRITE UA: NEGATIVE
Protein, UA: NEGATIVE
RBC UA: NEGATIVE

## 2017-02-22 MED ORDER — HYDROXYPROGESTERONE CAPROATE 275 MG/1.1ML ~~LOC~~ SOAJ
275.0000 mg | Freq: Once | SUBCUTANEOUS | Status: AC
  Administered 2017-02-22: 275 mg via SUBCUTANEOUS

## 2017-02-22 NOTE — Progress Notes (Signed)
Pt given Makena 275mg SQ right arm without complications. Advised pt to return in 1 week for next injection. Pt verbalized understanding 

## 2017-02-28 ENCOUNTER — Encounter: Payer: Self-pay | Admitting: Women's Health

## 2017-03-01 ENCOUNTER — Ambulatory Visit: Payer: BLUE CROSS/BLUE SHIELD

## 2017-03-02 ENCOUNTER — Ambulatory Visit (INDEPENDENT_AMBULATORY_CARE_PROVIDER_SITE_OTHER): Payer: Commercial Managed Care - PPO | Admitting: *Deleted

## 2017-03-02 ENCOUNTER — Encounter: Payer: Self-pay | Admitting: *Deleted

## 2017-03-02 VITALS — BP 118/70 | HR 94 | Wt 291.0 lb

## 2017-03-02 DIAGNOSIS — O09212 Supervision of pregnancy with history of pre-term labor, second trimester: Secondary | ICD-10-CM

## 2017-03-02 DIAGNOSIS — Z331 Pregnant state, incidental: Secondary | ICD-10-CM

## 2017-03-02 DIAGNOSIS — Z8751 Personal history of pre-term labor: Secondary | ICD-10-CM

## 2017-03-02 DIAGNOSIS — O09219 Supervision of pregnancy with history of pre-term labor, unspecified trimester: Secondary | ICD-10-CM

## 2017-03-02 DIAGNOSIS — Z1389 Encounter for screening for other disorder: Secondary | ICD-10-CM

## 2017-03-02 DIAGNOSIS — O09899 Supervision of other high risk pregnancies, unspecified trimester: Secondary | ICD-10-CM

## 2017-03-02 LAB — POCT URINALYSIS DIPSTICK
GLUCOSE UA: NEGATIVE
Ketones, UA: NEGATIVE
NITRITE UA: NEGATIVE
PROTEIN UA: NEGATIVE
RBC UA: NEGATIVE

## 2017-03-02 MED ORDER — HYDROXYPROGESTERONE CAPROATE 275 MG/1.1ML ~~LOC~~ SOAJ
275.0000 mg | Freq: Once | SUBCUTANEOUS | Status: AC
  Administered 2017-03-02: 275 mg via SUBCUTANEOUS

## 2017-03-02 NOTE — Progress Notes (Signed)
Pt given Makena 275mg  SQ Right arm without complications. Advised pt to return in 1 week for next injection.

## 2017-03-07 ENCOUNTER — Telehealth: Payer: Self-pay | Admitting: *Deleted

## 2017-03-07 NOTE — Telephone Encounter (Signed)
[facility] pharmacy called stated patient had new insurance and needed a prior authorization done for [facility]. Covermymeds called and PA approved. [facility] pharmacy notified, order and information given. Stated they needed to get in touch with the patient first in order to ship medication today. Will call us back to confirm shipment.

## 2017-03-07 NOTE — Telephone Encounter (Signed)
Patient states she spoke to pharmacy but they state her copay will be $400. They can use Medicaid as secondary but she will have to call pharmacy back with MCD number.

## 2017-03-08 ENCOUNTER — Ambulatory Visit (INDEPENDENT_AMBULATORY_CARE_PROVIDER_SITE_OTHER): Payer: Commercial Managed Care - PPO | Admitting: *Deleted

## 2017-03-08 ENCOUNTER — Encounter: Payer: Self-pay | Admitting: *Deleted

## 2017-03-08 VITALS — BP 134/82 | HR 111 | Wt 290.0 lb

## 2017-03-08 DIAGNOSIS — Z1389 Encounter for screening for other disorder: Secondary | ICD-10-CM

## 2017-03-08 DIAGNOSIS — Z8751 Personal history of pre-term labor: Secondary | ICD-10-CM

## 2017-03-08 DIAGNOSIS — Z331 Pregnant state, incidental: Secondary | ICD-10-CM

## 2017-03-08 DIAGNOSIS — O09212 Supervision of pregnancy with history of pre-term labor, second trimester: Secondary | ICD-10-CM | POA: Diagnosis not present

## 2017-03-08 LAB — POCT URINALYSIS DIPSTICK
Glucose, UA: NEGATIVE
Ketones, UA: NEGATIVE
Leukocytes, UA: NEGATIVE
Nitrite, UA: NEGATIVE
PROTEIN UA: NEGATIVE
RBC UA: NEGATIVE

## 2017-03-08 MED ORDER — HYDROXYPROGESTERONE CAPROATE 275 MG/1.1ML ~~LOC~~ SOAJ
275.0000 mg | Freq: Once | SUBCUTANEOUS | Status: AC
  Administered 2017-03-08: 275 mg via SUBCUTANEOUS

## 2017-03-08 NOTE — Progress Notes (Signed)
Pt given Makena 275mg SQ left arm without complications. Advised pt to return in 1 week for next injection.  

## 2017-03-15 ENCOUNTER — Other Ambulatory Visit: Payer: Commercial Managed Care - PPO

## 2017-03-15 ENCOUNTER — Ambulatory Visit (INDEPENDENT_AMBULATORY_CARE_PROVIDER_SITE_OTHER): Payer: Commercial Managed Care - PPO | Admitting: Advanced Practice Midwife

## 2017-03-15 VITALS — BP 118/70 | HR 80 | Wt 294.0 lb

## 2017-03-15 DIAGNOSIS — Z3482 Encounter for supervision of other normal pregnancy, second trimester: Secondary | ICD-10-CM

## 2017-03-15 DIAGNOSIS — Z331 Pregnant state, incidental: Secondary | ICD-10-CM

## 2017-03-15 DIAGNOSIS — Z1389 Encounter for screening for other disorder: Secondary | ICD-10-CM

## 2017-03-15 DIAGNOSIS — O09219 Supervision of pregnancy with history of pre-term labor, unspecified trimester: Secondary | ICD-10-CM | POA: Diagnosis not present

## 2017-03-15 DIAGNOSIS — O26843 Uterine size-date discrepancy, third trimester: Secondary | ICD-10-CM

## 2017-03-15 DIAGNOSIS — O09899 Supervision of other high risk pregnancies, unspecified trimester: Secondary | ICD-10-CM

## 2017-03-15 DIAGNOSIS — Z3A26 26 weeks gestation of pregnancy: Secondary | ICD-10-CM

## 2017-03-15 DIAGNOSIS — O98312 Other infections with a predominantly sexual mode of transmission complicating pregnancy, second trimester: Secondary | ICD-10-CM

## 2017-03-15 DIAGNOSIS — A599 Trichomoniasis, unspecified: Secondary | ICD-10-CM

## 2017-03-15 LAB — POCT URINALYSIS DIPSTICK
Blood, UA: NEGATIVE
GLUCOSE UA: NEGATIVE
Ketones, UA: NEGATIVE
Leukocytes, UA: NEGATIVE
Nitrite, UA: NEGATIVE
Protein, UA: NEGATIVE

## 2017-03-15 MED ORDER — HYDROXYPROGESTERONE CAPROATE 275 MG/1.1ML ~~LOC~~ SOAJ
275.0000 mg | Freq: Once | SUBCUTANEOUS | Status: AC
  Administered 2017-03-15: 275 mg via SUBCUTANEOUS

## 2017-03-15 NOTE — Patient Instructions (Signed)

## 2017-03-15 NOTE — Progress Notes (Signed)
``````````````````````````````````````````  [id number] 3769w5d Estimated Date of Delivery: 06/16/17  Blood pressure 118/70, pulse 80, weight 294 lb (133.4 kg), last menstrual period 09/09/2016.   BP weight and urine results all reviewed and noted.  Please refer to the obstetrical flow sheet for the fundal height and fetal heart rate documentation: size>dates, probalby just d/t habitus Patient reports good fetal movement, denies any bleeding and no rupture of membranes symptoms or regular contractions. Patient is without complaints. [place] the beach [place] Mom can give her the 17p.   All questions were answered.  Orders Placed This Encounter  Procedures  . Trichomonas vaginalis, RNA  . US OB Follow Up  . POCT urinalysis dipstick    Plan:  Continued routine obstetrical care, POC trich  Return in about 2 weeks (around 03/29/2017) for 17p, PN2 and US for EFW.

## 2017-03-17 LAB — TRICHOMONAS VAGINALIS, PROBE AMP: TRICH VAG BY NAA: POSITIVE — AB

## 2017-03-20 ENCOUNTER — Encounter: Payer: Self-pay | Admitting: Advanced Practice Midwife

## 2017-03-20 ENCOUNTER — Other Ambulatory Visit: Payer: Self-pay | Admitting: Advanced Practice Midwife

## 2017-03-20 ENCOUNTER — Telehealth: Payer: Self-pay | Admitting: Advanced Practice Midwife

## 2017-03-20 MED ORDER — METRONIDAZOLE 500 MG PO TABS: 500.0000 mg | ORAL_TABLET | Freq: Two times a day (BID) | ORAL | 0 refills | Status: DC

## 2017-03-20 NOTE — Telephone Encounter (Signed)
Patient called stating that Denise Buck has called and she sent a medication to her pharmacy, Pt states that she does not use [facility] and she had the nurse change it every time she comes and [facility] [place]. Please resent medication to this pharmacy.

## 2017-03-20 NOTE — Telephone Encounter (Signed)
[facility] Pt notified.

## 2017-03-20 NOTE — Progress Notes (Signed)
Flagyl 500mg BID for trich.   

## 2017-03-29 ENCOUNTER — Encounter: Payer: Self-pay | Admitting: Women's Health

## 2017-03-29 ENCOUNTER — Ambulatory Visit (INDEPENDENT_AMBULATORY_CARE_PROVIDER_SITE_OTHER): Payer: Commercial Managed Care - PPO | Admitting: Women's Health

## 2017-03-29 ENCOUNTER — Ambulatory Visit (INDEPENDENT_AMBULATORY_CARE_PROVIDER_SITE_OTHER): Payer: Commercial Managed Care - PPO

## 2017-03-29 ENCOUNTER — Other Ambulatory Visit: Payer: Commercial Managed Care - PPO

## 2017-03-29 VITALS — BP 126/80 | HR 92 | Wt 292.8 lb

## 2017-03-29 DIAGNOSIS — Z1389 Encounter for screening for other disorder: Secondary | ICD-10-CM

## 2017-03-29 DIAGNOSIS — O26843 Uterine size-date discrepancy, third trimester: Secondary | ICD-10-CM

## 2017-03-29 DIAGNOSIS — Z3A28 28 weeks gestation of pregnancy: Secondary | ICD-10-CM

## 2017-03-29 DIAGNOSIS — O99323 Drug use complicating pregnancy, third trimester: Secondary | ICD-10-CM

## 2017-03-29 DIAGNOSIS — Z331 Pregnant state, incidental: Secondary | ICD-10-CM

## 2017-03-29 DIAGNOSIS — O98313 Other infections with a predominantly sexual mode of transmission complicating pregnancy, third trimester: Secondary | ICD-10-CM

## 2017-03-29 DIAGNOSIS — O09213 Supervision of pregnancy with history of pre-term labor, third trimester: Secondary | ICD-10-CM | POA: Diagnosis not present

## 2017-03-29 DIAGNOSIS — Z3482 Encounter for supervision of other normal pregnancy, second trimester: Secondary | ICD-10-CM

## 2017-03-29 DIAGNOSIS — Z131 Encounter for screening for diabetes mellitus: Secondary | ICD-10-CM

## 2017-03-29 DIAGNOSIS — A599 Trichomoniasis, unspecified: Secondary | ICD-10-CM

## 2017-03-29 DIAGNOSIS — Z8751 Personal history of pre-term labor: Secondary | ICD-10-CM

## 2017-03-29 DIAGNOSIS — F1290 Cannabis use, unspecified, uncomplicated: Secondary | ICD-10-CM

## 2017-03-29 DIAGNOSIS — Z3483 Encounter for supervision of other normal pregnancy, third trimester: Secondary | ICD-10-CM

## 2017-03-29 DIAGNOSIS — Z3402 Encounter for supervision of normal first pregnancy, second trimester: Secondary | ICD-10-CM

## 2017-03-29 LAB — POCT URINALYSIS DIPSTICK
Blood, UA: NEGATIVE
Glucose, UA: NEGATIVE
KETONES UA: NEGATIVE
LEUKOCYTES UA: NEGATIVE
Nitrite, UA: NEGATIVE
Protein, UA: NEGATIVE

## 2017-03-29 MED ORDER — HYDROXYPROGESTERONE CAPROATE 275 MG/1.1ML ~~LOC~~ SOAJ
275.0000 mg | Freq: Once | SUBCUTANEOUS | Status: AC
  Administered 2017-03-29: 275 mg via SUBCUTANEOUS

## 2017-03-29 NOTE — Progress Notes (Signed)
Pt given Makena 275mg  SQ right arm without complications. Advised pt to return in 1 week for next injection.

## 2017-03-29 NOTE — Progress Notes (Signed)
US 28+5 wks,cephalic,cx 3.3 cm,post pl gr 0,normal ovaries bilat,fhr 148 bpm,afi 12 cm,EFW 1406 g 64%

## 2017-03-29 NOTE — Progress Notes (Signed)
Low-risk OB appointment G3P1101 492w5d Estimated Date of Delivery: 06/16/17 BP 126/80   Pulse 92   Wt 292 lb 12.8 oz (132.8 kg)   LMP 09/09/2016 (Exact Date)   BMI 45.86 kg/m   BP, weight, and urine reviewed.  Refer to obstetrical flow sheet for FH & FHR.  Reports good fm.  Denies regular uc's, lof, vb, or uti s/s. No complaints. Swab obtained for trichomonas poc, has been treated twice now- states she hasn't had sex w/ partner since prior to 1st treatment- they are no longer together. Denies any sx. Took meds as directed both times.  Reviewed ptl s/s, fkc. Recommended Tdap at HD/PCP per CDC guidelines.  Discussed today's normal u/s for s>d: efw 64%, afi 12cm Plan:  Continue routine obstetrical care  F/U in 4wks for OB appointment, [facility]

## 2017-03-29 NOTE — Patient Instructions (Signed)

## 2017-03-30 LAB — GLUCOSE TOLERANCE, 2 HOURS W/ 1HR
GLUCOSE, 1 HOUR: 121 mg/dL (ref 65–179)
Glucose, 2 hour: 82 mg/dL (ref 65–152)
Glucose, Fasting: 86 mg/dL (ref 65–91)

## 2017-03-30 LAB — MED LIST OPTION NOT SELECTED

## 2017-03-30 LAB — CBC
HEMATOCRIT: 36.6 % (ref 34.0–46.6)
HEMOGLOBIN: 12.2 g/dL (ref 11.1–15.9)
MCH: 29.5 pg (ref 26.6–33.0)
MCHC: 33.3 g/dL (ref 31.5–35.7)
MCV: 88 fL (ref 79–97)
Platelets: 222 10*3/uL (ref 150–379)
RBC: 4.14 x10E6/uL (ref 3.77–5.28)
RDW: 13.7 % (ref 12.3–15.4)
WBC: 10.5 10*3/uL (ref 3.4–10.8)

## 2017-03-30 LAB — PMP SCREEN PROFILE (10S), URINE
Amphetamine Scrn, Ur: NEGATIVE ng/mL
BARBITURATE SCREEN URINE: NEGATIVE ng/mL
BENZODIAZEPINE SCREEN, URINE: NEGATIVE ng/mL
CANNABINOIDS UR QL SCN: POSITIVE ng/mL — AB
CREATININE(CRT), U: 106.9 mg/dL (ref 20.0–300.0)
Cocaine (Metab) Scrn, Ur: NEGATIVE ng/mL
METHADONE SCREEN, URINE: NEGATIVE ng/mL
OXYCODONE+OXYMORPHONE UR QL SCN: NEGATIVE ng/mL
Opiate Scrn, Ur: NEGATIVE ng/mL
PH UR, DRUG SCRN: 8.3 (ref 4.5–8.9)
Phencyclidine Qn, Ur: NEGATIVE ng/mL
Propoxyphene Scrn, Ur: NEGATIVE ng/mL

## 2017-03-30 LAB — ANTIBODY SCREEN: Antibody Screen: NEGATIVE

## 2017-03-30 LAB — RPR: RPR Ser Ql: NONREACTIVE

## 2017-03-30 LAB — HIV ANTIBODY (ROUTINE TESTING W REFLEX): HIV SCREEN 4TH GENERATION: NONREACTIVE

## 2017-03-31 LAB — TRICHOMONAS VAGINALIS, PROBE AMP: Trich vag by NAA: POSITIVE — AB

## 2017-04-02 ENCOUNTER — Telehealth: Payer: Self-pay | Admitting: Adult Health

## 2017-04-02 DIAGNOSIS — A599 Trichomoniasis, unspecified: Secondary | ICD-10-CM

## 2017-04-02 MED ORDER — METRONIDAZOLE 500 MG PO TABS: 500.0000 mg | ORAL_TABLET | Freq: Three times a day (TID) | ORAL | 0 refills | Status: DC

## 2017-04-02 NOTE — Telephone Encounter (Signed)
Left message that trich still + will treat with flagyl 500 mg 1 tid x 7 days no sex, recheck at F/U visit

## 2017-04-05 ENCOUNTER — Ambulatory Visit: Payer: Commercial Managed Care - PPO

## 2017-04-06 ENCOUNTER — Encounter: Payer: Self-pay | Admitting: *Deleted

## 2017-04-06 ENCOUNTER — Ambulatory Visit (INDEPENDENT_AMBULATORY_CARE_PROVIDER_SITE_OTHER): Payer: Commercial Managed Care - PPO | Admitting: *Deleted

## 2017-04-06 VITALS — BP 124/78 | HR 98 | Ht 67.0 in | Wt 294.0 lb

## 2017-04-06 DIAGNOSIS — O09212 Supervision of pregnancy with history of pre-term labor, second trimester: Secondary | ICD-10-CM

## 2017-04-06 DIAGNOSIS — Z1389 Encounter for screening for other disorder: Secondary | ICD-10-CM

## 2017-04-06 DIAGNOSIS — Z331 Pregnant state, incidental: Secondary | ICD-10-CM

## 2017-04-06 DIAGNOSIS — Z8751 Personal history of pre-term labor: Secondary | ICD-10-CM

## 2017-04-06 LAB — POCT URINALYSIS DIPSTICK
Blood, UA: NEGATIVE
Glucose, UA: NEGATIVE
KETONES UA: NEGATIVE
Nitrite, UA: NEGATIVE
Protein, UA: NEGATIVE

## 2017-04-06 MED ORDER — HYDROXYPROGESTERONE CAPROATE 275 MG/1.1ML ~~LOC~~ SOAJ
275.0000 mg | Freq: Once | SUBCUTANEOUS | Status: AC
  Administered 2017-04-06: 275 mg via SUBCUTANEOUS

## 2017-04-06 NOTE — Progress Notes (Signed)
Pt given Makena 275mg SQ left arm without complications. Advised pt to return in 1 week for next injection.  

## 2017-04-08 ENCOUNTER — Encounter: Payer: Self-pay | Admitting: Women's Health

## 2017-04-09 ENCOUNTER — Other Ambulatory Visit: Payer: Self-pay | Admitting: Women's Health

## 2017-04-09 MED ORDER — METRONIDAZOLE 500 MG PO TABS: 500.0000 mg | ORAL_TABLET | Freq: Three times a day (TID) | ORAL | 0 refills | Status: DC

## 2017-04-12 ENCOUNTER — Encounter: Payer: Self-pay | Admitting: Women's Health

## 2017-04-12 ENCOUNTER — Ambulatory Visit: Payer: Commercial Managed Care - PPO

## 2017-04-13 ENCOUNTER — Ambulatory Visit (INDEPENDENT_AMBULATORY_CARE_PROVIDER_SITE_OTHER): Payer: Commercial Managed Care - PPO | Admitting: *Deleted

## 2017-04-13 ENCOUNTER — Encounter: Payer: Self-pay | Admitting: *Deleted

## 2017-04-13 VITALS — BP 120/60 | HR 110 | Ht 67.0 in | Wt 295.5 lb

## 2017-04-13 DIAGNOSIS — Z3A30 30 weeks gestation of pregnancy: Secondary | ICD-10-CM

## 2017-04-13 DIAGNOSIS — Z3483 Encounter for supervision of other normal pregnancy, third trimester: Secondary | ICD-10-CM

## 2017-04-13 DIAGNOSIS — Z331 Pregnant state, incidental: Secondary | ICD-10-CM

## 2017-04-13 DIAGNOSIS — O09893 Supervision of other high risk pregnancies, third trimester: Secondary | ICD-10-CM

## 2017-04-13 DIAGNOSIS — O09213 Supervision of pregnancy with history of pre-term labor, third trimester: Secondary | ICD-10-CM | POA: Diagnosis not present

## 2017-04-13 DIAGNOSIS — Z1389 Encounter for screening for other disorder: Secondary | ICD-10-CM

## 2017-04-13 LAB — POCT URINALYSIS DIPSTICK
Blood, UA: NEGATIVE
GLUCOSE UA: NEGATIVE
KETONES UA: NEGATIVE
LEUKOCYTES UA: NEGATIVE
Nitrite, UA: NEGATIVE
Protein, UA: NEGATIVE

## 2017-04-13 MED ORDER — HYDROXYPROGESTERONE CAPROATE 275 MG/1.1ML ~~LOC~~ SOAJ
275.0000 mg | Freq: Once | SUBCUTANEOUS | Status: AC
  Administered 2017-04-13: 275 mg via SUBCUTANEOUS

## 2017-04-13 NOTE — Progress Notes (Signed)
Pt here for 17P. Pt tolerated shot well. Return in 1 week for next shot. JSY 

## 2017-04-18 ENCOUNTER — Encounter: Payer: Self-pay | Admitting: Women's Health

## 2017-04-19 ENCOUNTER — Ambulatory Visit (INDEPENDENT_AMBULATORY_CARE_PROVIDER_SITE_OTHER): Payer: Commercial Managed Care - PPO | Admitting: *Deleted

## 2017-04-19 ENCOUNTER — Encounter: Payer: Self-pay | Admitting: *Deleted

## 2017-04-19 ENCOUNTER — Telehealth: Payer: Self-pay | Admitting: Obstetrics & Gynecology

## 2017-04-19 ENCOUNTER — Telehealth: Payer: Self-pay | Admitting: *Deleted

## 2017-04-19 VITALS — BP 102/80 | HR 80 | Wt 295.0 lb

## 2017-04-19 DIAGNOSIS — Z3A31 31 weeks gestation of pregnancy: Secondary | ICD-10-CM

## 2017-04-19 DIAGNOSIS — Z1389 Encounter for screening for other disorder: Secondary | ICD-10-CM | POA: Diagnosis not present

## 2017-04-19 DIAGNOSIS — O09899 Supervision of other high risk pregnancies, unspecified trimester: Secondary | ICD-10-CM

## 2017-04-19 DIAGNOSIS — Z331 Pregnant state, incidental: Secondary | ICD-10-CM

## 2017-04-19 DIAGNOSIS — O09213 Supervision of pregnancy with history of pre-term labor, third trimester: Secondary | ICD-10-CM

## 2017-04-19 DIAGNOSIS — O09219 Supervision of pregnancy with history of pre-term labor, unspecified trimester: Principal | ICD-10-CM

## 2017-04-19 LAB — POCT URINALYSIS DIPSTICK
Glucose, UA: NEGATIVE
KETONES UA: NEGATIVE
Nitrite, UA: NEGATIVE
RBC UA: NEGATIVE

## 2017-04-19 MED ORDER — HYDROXYPROGESTERONE CAPROATE 275 MG/1.1ML ~~LOC~~ SOAJ
275.0000 mg | Freq: Once | SUBCUTANEOUS | Status: AC
  Administered 2017-04-19: 275 mg via SUBCUTANEOUS

## 2017-04-19 NOTE — Progress Notes (Signed)
17P given in left arm with no complications. Pt to return in 1 week for next injection.  

## 2017-04-19 NOTE — Telephone Encounter (Signed)
Kwame Jiggetts DOB [birth year], NKDA  Metronidazole 500mg  [facility] [place]

## 2017-04-19 NOTE — Telephone Encounter (Signed)
Patient called stating that she left a message thru mychart regarding some medication for her boyfriend. Pt states that the prescription that was sent for him was sent to the pharmacy that is permanently  closed. Please contact pt

## 2017-04-26 ENCOUNTER — Ambulatory Visit (INDEPENDENT_AMBULATORY_CARE_PROVIDER_SITE_OTHER): Payer: Commercial Managed Care - PPO | Admitting: Advanced Practice Midwife

## 2017-04-26 ENCOUNTER — Encounter: Payer: Self-pay | Admitting: Advanced Practice Midwife

## 2017-04-26 VITALS — BP 130/68 | HR 106 | Wt 298.5 lb

## 2017-04-26 DIAGNOSIS — Z331 Pregnant state, incidental: Secondary | ICD-10-CM

## 2017-04-26 DIAGNOSIS — A599 Trichomoniasis, unspecified: Secondary | ICD-10-CM

## 2017-04-26 DIAGNOSIS — Z1389 Encounter for screening for other disorder: Secondary | ICD-10-CM

## 2017-04-26 DIAGNOSIS — Z3A32 32 weeks gestation of pregnancy: Secondary | ICD-10-CM

## 2017-04-26 DIAGNOSIS — Z3483 Encounter for supervision of other normal pregnancy, third trimester: Secondary | ICD-10-CM

## 2017-04-26 DIAGNOSIS — O98313 Other infections with a predominantly sexual mode of transmission complicating pregnancy, third trimester: Secondary | ICD-10-CM

## 2017-04-26 DIAGNOSIS — O09219 Supervision of pregnancy with history of pre-term labor, unspecified trimester: Secondary | ICD-10-CM

## 2017-04-26 DIAGNOSIS — O09899 Supervision of other high risk pregnancies, unspecified trimester: Secondary | ICD-10-CM

## 2017-04-26 LAB — POCT URINALYSIS DIPSTICK
Glucose, UA: NEGATIVE
KETONES UA: NEGATIVE
Nitrite, UA: NEGATIVE
Protein, UA: NEGATIVE

## 2017-04-26 MED ORDER — HYDROXYPROGESTERONE CAPROATE 275 MG/1.1ML ~~LOC~~ SOAJ
275.0000 mg | Freq: Once | SUBCUTANEOUS | Status: AC
  Administered 2017-04-26: 275 mg via SUBCUTANEOUS

## 2017-04-26 NOTE — Progress Notes (Signed)
[id number] 4438w5d Estimated Date of Delivery: 06/16/17  Last menstrual period 09/09/2016.   BP weight and urine results all reviewed and noted.  Please refer to the obstetrical flow sheet for the fundal height and fetal heart rate documentation:  Patient reports good fetal movement, denies any bleeding and no rupture of membranes symptoms or regular contractions. Patient's right fingers numb. [place] lost mucus plug, cx closed.  All questions were answered.  Orders Placed This Encounter  Procedures  . Trichomonas vaginalis, RNA  POC trich  Plan:  Continued routine obstetrical care, 17p today  Return for 17P Weekly and LROB in 2 weeks.  Wrist splint at night

## 2017-04-28 LAB — TRICHOMONAS VAGINALIS, PROBE AMP: TRICH VAG BY NAA: NEGATIVE

## 2017-05-01 ENCOUNTER — Telehealth: Payer: Self-pay | Admitting: Advanced Practice Midwife

## 2017-05-01 NOTE — Telephone Encounter (Signed)
Patient called stating that she had some labs done on her last visit and she wants to know the results. Please contact pt

## 2017-05-01 NOTE — Telephone Encounter (Signed)
Informed patient Denise Buck was negative. Verbalized gratitude.

## 2017-05-03 ENCOUNTER — Ambulatory Visit (INDEPENDENT_AMBULATORY_CARE_PROVIDER_SITE_OTHER): Payer: Commercial Managed Care - PPO

## 2017-05-03 VITALS — BP 104/70 | HR 104 | Ht 67.0 in | Wt 295.0 lb

## 2017-05-03 DIAGNOSIS — Z331 Pregnant state, incidental: Secondary | ICD-10-CM | POA: Diagnosis not present

## 2017-05-03 DIAGNOSIS — O09213 Supervision of pregnancy with history of pre-term labor, third trimester: Secondary | ICD-10-CM | POA: Diagnosis not present

## 2017-05-03 DIAGNOSIS — O09893 Supervision of other high risk pregnancies, third trimester: Secondary | ICD-10-CM

## 2017-05-03 DIAGNOSIS — Z1389 Encounter for screening for other disorder: Secondary | ICD-10-CM | POA: Diagnosis not present

## 2017-05-03 DIAGNOSIS — Z3A33 33 weeks gestation of pregnancy: Secondary | ICD-10-CM | POA: Diagnosis not present

## 2017-05-03 DIAGNOSIS — Z23 Encounter for immunization: Secondary | ICD-10-CM | POA: Diagnosis not present

## 2017-05-03 LAB — POCT URINALYSIS DIPSTICK
Glucose, UA: NEGATIVE
Ketones, UA: NEGATIVE
LEUKOCYTES UA: NEGATIVE
NITRITE UA: NEGATIVE

## 2017-05-03 MED ORDER — HYDROXYPROGESTERONE CAPROATE 275 MG/1.1ML ~~LOC~~ SOAJ
275.0000 mg | Freq: Once | SUBCUTANEOUS | Status: AC
  Administered 2017-05-03: 275 mg via SUBCUTANEOUS

## 2017-05-03 NOTE — Progress Notes (Signed)
PT here for 17p shot given rt arm. Tolerated well, return 1 week for next injection. Pad CMA.

## 2017-05-10 ENCOUNTER — Encounter: Payer: Self-pay | Admitting: Obstetrics and Gynecology

## 2017-05-10 ENCOUNTER — Ambulatory Visit (INDEPENDENT_AMBULATORY_CARE_PROVIDER_SITE_OTHER): Payer: Commercial Managed Care - PPO | Admitting: Obstetrics and Gynecology

## 2017-05-10 VITALS — BP 138/84 | HR 102 | Wt 299.0 lb

## 2017-05-10 DIAGNOSIS — Z3483 Encounter for supervision of other normal pregnancy, third trimester: Secondary | ICD-10-CM

## 2017-05-10 DIAGNOSIS — O09213 Supervision of pregnancy with history of pre-term labor, third trimester: Secondary | ICD-10-CM

## 2017-05-10 DIAGNOSIS — O418X30 Other specified disorders of amniotic fluid and membranes, third trimester, not applicable or unspecified: Secondary | ICD-10-CM

## 2017-05-10 DIAGNOSIS — Z331 Pregnant state, incidental: Secondary | ICD-10-CM

## 2017-05-10 DIAGNOSIS — Z1389 Encounter for screening for other disorder: Secondary | ICD-10-CM

## 2017-05-10 DIAGNOSIS — Z3A34 34 weeks gestation of pregnancy: Secondary | ICD-10-CM | POA: Diagnosis not present

## 2017-05-10 DIAGNOSIS — O468X3 Other antepartum hemorrhage, third trimester: Secondary | ICD-10-CM

## 2017-05-10 DIAGNOSIS — O34219 Maternal care for unspecified type scar from previous cesarean delivery: Secondary | ICD-10-CM

## 2017-05-10 DIAGNOSIS — O468X1 Other antepartum hemorrhage, first trimester: Secondary | ICD-10-CM

## 2017-05-10 DIAGNOSIS — Z8751 Personal history of pre-term labor: Secondary | ICD-10-CM

## 2017-05-10 DIAGNOSIS — O418X10 Other specified disorders of amniotic fluid and membranes, first trimester, not applicable or unspecified: Secondary | ICD-10-CM

## 2017-05-10 LAB — POCT URINALYSIS DIPSTICK
Blood, UA: NEGATIVE
GLUCOSE UA: NEGATIVE
Ketones, UA: NEGATIVE
LEUKOCYTES UA: NEGATIVE
Nitrite, UA: NEGATIVE

## 2017-05-10 MED ORDER — HYDROXYPROGESTERONE CAPROATE 275 MG/1.1ML ~~LOC~~ SOAJ
275.0000 mg | Freq: Once | SUBCUTANEOUS | Status: AC
  Administered 2017-05-10: 275 mg via SUBCUTANEOUS

## 2017-05-10 NOTE — Progress Notes (Signed)
Pt given Makena 275mg SQ left arm without complications. Advised pt to return in 1 week for next injection.  

## 2017-05-10 NOTE — Progress Notes (Signed)
See other note this date. 

## 2017-05-10 NOTE — Progress Notes (Signed)
Patient ID: Denise Buck, female   DOB: Apr 25, 1991, 26 y.o.   MRN: [id number]  Estimated Date of Delivery: 06/16/17 LROB [redacted]w[redacted]d  Chief Complaint  Patient presents with  . Routine Prenatal Visit    17P  ____  Patient complaints: No complaints. Patient reports good fetal movement, denies any bleeding , rupture of membranes,or regular contractions.  Blood pressure 138/84, pulse (!) 102, weight 299 lb (135.6 kg), last menstrual period 09/09/2016.   Urine results:notable for negative refer to the ob flow sheet for FH and FHR, ,                          Physical Examination: General appearance - alert, well appearing, and in no distress and oriented to person, place, and time                                      Abdomen - FH 41 cm ,                                                         -FHR 155     Questions were answered. Assessment: LROB G3P1101 @ [redacted]w[redacted]d   Plan:  Continued routine obstetrical care, C -section scheduled on Monday, October 22nd at 7:30 am  Case number: [id number]  F/u in 2 weeks for LROB [facility] is been scheduled for a repeat cesarean section on Monday By signing my name below, I, Diona Browner, attest that this documentation has been prepared under the direction and in the presence of Tilda Burrow, MD. Electronically Signed: Diona Browner, Medical Scribe. 05/10/17. 9:22 AM.  I personally performed the services described in this documentation, which was SCRIBED in my presence. The recorded information has been reviewed and considered accurate. It has been edited as necessary during review. Tilda Burrow, MD

## 2017-05-17 ENCOUNTER — Ambulatory Visit (INDEPENDENT_AMBULATORY_CARE_PROVIDER_SITE_OTHER): Payer: Commercial Managed Care - PPO | Admitting: *Deleted

## 2017-05-17 ENCOUNTER — Encounter: Payer: Self-pay | Admitting: *Deleted

## 2017-05-17 VITALS — BP 120/76 | HR 108 | Wt 298.0 lb

## 2017-05-17 DIAGNOSIS — Z1389 Encounter for screening for other disorder: Secondary | ICD-10-CM | POA: Diagnosis not present

## 2017-05-17 DIAGNOSIS — Z331 Pregnant state, incidental: Secondary | ICD-10-CM

## 2017-05-17 DIAGNOSIS — O09219 Supervision of pregnancy with history of pre-term labor, unspecified trimester: Secondary | ICD-10-CM | POA: Diagnosis not present

## 2017-05-17 DIAGNOSIS — O09899 Supervision of other high risk pregnancies, unspecified trimester: Secondary | ICD-10-CM

## 2017-05-17 LAB — POCT URINALYSIS DIPSTICK
Glucose, UA: NEGATIVE
KETONES UA: NEGATIVE
Leukocytes, UA: NEGATIVE
Nitrite, UA: NEGATIVE
PROTEIN UA: NEGATIVE
RBC UA: NEGATIVE

## 2017-05-17 MED ORDER — HYDROXYPROGESTERONE CAPROATE 275 MG/1.1ML ~~LOC~~ SOAJ
275.0000 mg | Freq: Once | SUBCUTANEOUS | Status: AC
  Administered 2017-05-17: 275 mg via SUBCUTANEOUS

## 2017-05-17 NOTE — Progress Notes (Signed)
Makena injection given in right arm with no complications. Pt to return next week for next injection.

## 2017-05-24 ENCOUNTER — Ambulatory Visit (INDEPENDENT_AMBULATORY_CARE_PROVIDER_SITE_OTHER): Payer: Commercial Managed Care - PPO | Admitting: Obstetrics and Gynecology

## 2017-05-24 ENCOUNTER — Encounter: Payer: Self-pay | Admitting: Obstetrics and Gynecology

## 2017-05-24 ENCOUNTER — Other Ambulatory Visit: Payer: Self-pay | Admitting: Obstetrics and Gynecology

## 2017-05-24 VITALS — BP 132/86 | HR 98 | Wt 299.6 lb

## 2017-05-24 DIAGNOSIS — Z3A36 36 weeks gestation of pregnancy: Secondary | ICD-10-CM | POA: Diagnosis not present

## 2017-05-24 DIAGNOSIS — Z8751 Personal history of pre-term labor: Secondary | ICD-10-CM

## 2017-05-24 DIAGNOSIS — O09213 Supervision of pregnancy with history of pre-term labor, third trimester: Secondary | ICD-10-CM

## 2017-05-24 DIAGNOSIS — Z1389 Encounter for screening for other disorder: Secondary | ICD-10-CM

## 2017-05-24 DIAGNOSIS — O34219 Maternal care for unspecified type scar from previous cesarean delivery: Secondary | ICD-10-CM

## 2017-05-24 DIAGNOSIS — Z3483 Encounter for supervision of other normal pregnancy, third trimester: Secondary | ICD-10-CM

## 2017-05-24 DIAGNOSIS — Z331 Pregnant state, incidental: Secondary | ICD-10-CM

## 2017-05-24 LAB — POCT URINALYSIS DIPSTICK
GLUCOSE UA: NEGATIVE
Ketones, UA: NEGATIVE
LEUKOCYTES UA: NEGATIVE
NITRITE UA: NEGATIVE
Protein, UA: NEGATIVE
RBC UA: NEGATIVE

## 2017-05-24 LAB — OB RESULTS CONSOLE GBS: GBS: POSITIVE

## 2017-05-24 MED ORDER — HYDROXYPROGESTERONE CAPROATE 275 MG/1.1ML ~~LOC~~ SOAJ
275.0000 mg | Freq: Once | SUBCUTANEOUS | Status: AC
  Administered 2017-05-24: 275 mg via SUBCUTANEOUS

## 2017-05-24 NOTE — Progress Notes (Signed)
Pt given Makena 275mg SQ left arm without complications.  

## 2017-05-24 NOTE — Progress Notes (Signed)
Denise Buck is a 26 y.o. female G3P1101  Estimated Date of Delivery: 06/16/17 LROB [redacted]w[redacted]d  Chief Complaint  Patient presents with  . Routine Prenatal Visit    17P; GBS/GC today  ____   Patient has no complaints. She wants to confirm that she is negative for trichomonas. She states she had it during this pregnancy, and FOB had taken the medications, but wants to be sure. Pt had a C-section, where she was in labor for 16 hours.She is resistant to considering trial of labor She states she is planning on one more baby in the next 5 years.  Patient reports good fetal movement,                          denies any bleeding , rupture of membranes,or regular contractions.  Blood pressure 132/86, pulse 98, weight 299 lb 9.6 oz (135.9 kg), last menstrual period 09/09/2016.   Urine results: negative refer to the ob flow sheet for FH and FHR, ,                          Physical Examination: General appearance - alert, well appearing, and in no distress                                      Abdomen - FH 38                                                        -FHR 150                                                         soft, nontender, nondistended, no masses or organomegaly                                      Pelvic - normal external genitalia, vulva, vagina, cervix, uterus and adnexa  Cervix is towards the back and closed  GBS done Trich done - Negative                                            Questions were answered. Assessment: LROB G3P1101 @ [redacted]w[redacted]d Estimated Date of Delivery: 06/16/17  Plan:  Continued routine obstetrical care, LROB  F/u in 1 weeks for LROB with Clelia Schaumann, repeat C -section scheduled on Monday, October 22nd at 7:30 am. Schedule pre-op visit with me the week before

## 2017-05-27 ENCOUNTER — Encounter: Payer: Self-pay | Admitting: Advanced Practice Midwife

## 2017-05-27 LAB — CULTURE, BETA STREP (GROUP B ONLY): STREP GP B CULTURE: POSITIVE — AB

## 2017-05-27 LAB — GC/CHLAMYDIA PROBE AMP
CHLAMYDIA, DNA PROBE: NEGATIVE
NEISSERIA GONORRHOEAE BY PCR: NEGATIVE

## 2017-05-30 ENCOUNTER — Encounter (HOSPITAL_COMMUNITY): Payer: Self-pay

## 2017-05-31 ENCOUNTER — Encounter: Payer: Self-pay | Admitting: Advanced Practice Midwife

## 2017-05-31 ENCOUNTER — Ambulatory Visit (INDEPENDENT_AMBULATORY_CARE_PROVIDER_SITE_OTHER): Payer: Commercial Managed Care - PPO | Admitting: Advanced Practice Midwife

## 2017-05-31 VITALS — BP 120/83 | HR 113 | Wt 304.0 lb

## 2017-05-31 DIAGNOSIS — Z3483 Encounter for supervision of other normal pregnancy, third trimester: Secondary | ICD-10-CM

## 2017-05-31 DIAGNOSIS — Z113 Encounter for screening for infections with a predominantly sexual mode of transmission: Secondary | ICD-10-CM | POA: Diagnosis not present

## 2017-05-31 DIAGNOSIS — Z3A37 37 weeks gestation of pregnancy: Secondary | ICD-10-CM

## 2017-05-31 NOTE — Progress Notes (Signed)
[id number] [redacted]w[redacted]d Estimated Date of Delivery: 06/16/17  Blood pressure 120/83, pulse (!) 113, weight (!) 304 lb (137.9 kg), last menstrual period 09/09/2016.   BP weight and urine results all reviewed and noted.  Please refer to the obstetrical flow sheet for the fundal height and fetal heart rate documentation:  Patient reports good fetal movement, denies any bleeding and no rupture of membranes symptoms or regular contractions. Patient is without complaints. Had unportected sex with FOB, nervous he may still have had trich wants to be checked.  Wet prep neg, discharge appears normal All questions were answered.  No orders of the defined types were placed in this encounter.   Plan:  Continued routine obstetrical care,   Return in about 1 week (around 06/07/2017) for LROB.

## 2017-06-06 ENCOUNTER — Encounter: Payer: Self-pay | Admitting: Obstetrics and Gynecology

## 2017-06-06 ENCOUNTER — Ambulatory Visit (INDEPENDENT_AMBULATORY_CARE_PROVIDER_SITE_OTHER): Payer: Commercial Managed Care - PPO | Admitting: Obstetrics and Gynecology

## 2017-06-06 ENCOUNTER — Other Ambulatory Visit: Payer: Self-pay | Admitting: Obstetrics and Gynecology

## 2017-06-06 VITALS — BP 130/84 | HR 112 | Wt 306.0 lb

## 2017-06-06 DIAGNOSIS — Z331 Pregnant state, incidental: Secondary | ICD-10-CM

## 2017-06-06 DIAGNOSIS — Z3A38 38 weeks gestation of pregnancy: Secondary | ICD-10-CM

## 2017-06-06 DIAGNOSIS — O34211 Maternal care for low transverse scar from previous cesarean delivery: Secondary | ICD-10-CM

## 2017-06-06 DIAGNOSIS — Z3483 Encounter for supervision of other normal pregnancy, third trimester: Secondary | ICD-10-CM

## 2017-06-06 DIAGNOSIS — Z1389 Encounter for screening for other disorder: Secondary | ICD-10-CM

## 2017-06-06 LAB — POCT URINALYSIS DIPSTICK
Blood, UA: NEGATIVE
Glucose, UA: NEGATIVE
Ketones, UA: NEGATIVE
NITRITE UA: NEGATIVE
PROTEIN UA: NEGATIVE

## 2017-06-06 NOTE — Progress Notes (Signed)
Patient ID: Gatha MayerMichaela Gilson, female   DOB: 07/21/1991, 26 y.o.   MRN: [id number]  Estimated Date of Delivery: 06/16/17 LROB 4714w4d  Chief Complaint  Patient presents with  . Routine Prenatal Visit  ____3614w4d prior cesarean not for TOLAC.    Patient complaints:  Contr plans::Pt reports she would like to have maybe one more child. Future BC undecided.  Patient reports good fetal movement, denies any bleeding, rupture of membranes,or regular contractions.  Blood pressure 130/84, pulse (!) 112, weight (!) 306 lb (138.8 kg), last menstrual period 09/09/2016.   Urine results:notable for trace leukocytes, otherwise negative refer to the ob flow sheet for FH and FHR, ,                          Physical Examination: General appearance - alert, well appearing, and in no distress and oriented to person, place, and time                                      Abdomen - FH 41 cm,                                                         -FHR 161                                           Low transverse scar well healed.                                         discussed wide excision of cicatrix as a part of entry, pt in favor. Questions were answered. Assessment: LROB G3P1101 @ 4814w4d                       Prior cesarean not for Tolac, for repeat c/s on 06/11/17 Plan:  1. Continued routine obstetrical care 2. Pt is scheduled for a c-section on 06/11/2017  F/u in 11-14 days for postpartum  By signing my name below, I, Diona BrownerJennifer Gorman, attest that this documentation has been prepared under the direction and in the presence of Tilda BurrowFerguson, Peightyn Roberson V, MD. Electronically Signed: Diona BrownerJennifer Gorman, Medical Scribe. 06/06/17. 9:19 AM.  I personally performed the services described in this documentation, which was SCRIBED in my presence. The recorded information has been reviewed and considered accurate. It has been edited as necessary during review. Tilda BurrowFERGUSON,Jiali Linney V, MD

## 2017-06-08 ENCOUNTER — Encounter (HOSPITAL_COMMUNITY)
Admission: RE | Admit: 2017-06-08 | Discharge: 2017-06-08 | Disposition: A | Discharge status: Home / Self Care | Payer: Commercial Managed Care - PPO | Source: Ambulatory Visit | Attending: Obstetrics and Gynecology | Admitting: Obstetrics and Gynecology

## 2017-06-08 LAB — URINALYSIS, ROUTINE W REFLEX MICROSCOPIC
Bilirubin Urine: NEGATIVE
Glucose, UA: NEGATIVE mg/dL
Hgb urine dipstick: NEGATIVE
KETONES UR: NEGATIVE mg/dL
Nitrite: NEGATIVE
PROTEIN: NEGATIVE mg/dL
Specific Gravity, Urine: 1.025 (ref 1.005–1.030)
pH: 5 (ref 5.0–8.0)

## 2017-06-08 LAB — CBC
HCT: 37.4 % (ref 36.0–46.0)
Hemoglobin: 12.5 g/dL (ref 12.0–15.0)
MCH: 29.4 pg (ref 26.0–34.0)
MCHC: 33.4 g/dL (ref 30.0–36.0)
MCV: 88 fL (ref 78.0–100.0)
PLATELETS: 276 10*3/uL (ref 150–400)
RBC: 4.25 MIL/uL (ref 3.87–5.11)
RDW: 14.4 % (ref 11.5–15.5)
WBC: 11.5 10*3/uL — ABNORMAL HIGH (ref 4.0–10.5)

## 2017-06-08 LAB — TYPE AND SCREEN
ABO/RH(D): O POS
Antibody Screen: NEGATIVE

## 2017-06-08 LAB — ABO/RH: ABO/RH(D): O POS

## 2017-06-08 NOTE — Patient Instructions (Signed)
Denise Buck  06/08/2017   Your procedure is scheduled on:  06/11/2017  Enter through the Main Entr[facility] of [facility] at 0530 AM.  Pick up the phone at the desk and dial [phone]  Call this number if you have problems the morning of surgery:[phone]  Remember:   Do not eat food:After Midnight.  Do not drink clear liquids: After Midnight.  Take these medicines the morning of surgery with A SIP OF WATER: none   Do not wear jewelry, make-up or nail polish.  Do not wear lotions, powders, or perfumes. Do not wear deodorant.  Do not shave 48 hours prior to surgery.  Do not bring valuables to the hospital.  [facility] is not   responsible for any belongings or valuables brought to the hospital.  Contacts, dentures or bridgework may not be worn into surgery.  Leave suitcase in the car. After surgery it may be brought to your room.  For patients admitted to the hospital, checkout time is 11:00 AM the day of              discharge.    N/A   Please read over the following fact sheets that you were given:   Surgical Site Infection Prevention

## 2017-06-09 LAB — RPR: RPR Ser Ql: NONREACTIVE

## 2017-06-11 ENCOUNTER — Inpatient Hospital Stay (HOSPITAL_COMMUNITY): Payer: Commercial Managed Care - PPO

## 2017-06-11 ENCOUNTER — Encounter (HOSPITAL_COMMUNITY): Payer: Self-pay | Admitting: *Deleted

## 2017-06-11 ENCOUNTER — Encounter (HOSPITAL_COMMUNITY)
Admission: AD | Disposition: A | Discharge status: Home / Self Care | Payer: Self-pay | Source: Ambulatory Visit | Attending: Obstetrics and Gynecology

## 2017-06-11 ENCOUNTER — Inpatient Hospital Stay (HOSPITAL_COMMUNITY)
Admission: AD | Admit: 2017-06-11 | Discharge: 2017-06-14 | DRG: 788 | Disposition: A | Discharge status: Home / Self Care | Payer: Commercial Managed Care - PPO | Source: Ambulatory Visit | Attending: Obstetrics and Gynecology | Admitting: Obstetrics and Gynecology

## 2017-06-11 DIAGNOSIS — O99824 Streptococcus B carrier state complicating childbirth: Secondary | ICD-10-CM | POA: Diagnosis present

## 2017-06-11 DIAGNOSIS — Z98891 History of uterine scar from previous surgery: Secondary | ICD-10-CM | POA: Diagnosis present

## 2017-06-11 DIAGNOSIS — Z3A39 39 weeks gestation of pregnancy: Secondary | ICD-10-CM | POA: Diagnosis not present

## 2017-06-11 DIAGNOSIS — O34211 Maternal care for low transverse scar from previous cesarean delivery: Secondary | ICD-10-CM | POA: Diagnosis present

## 2017-06-11 DIAGNOSIS — O99214 Obesity complicating childbirth: Secondary | ICD-10-CM | POA: Diagnosis present

## 2017-06-11 DIAGNOSIS — Z87891 Personal history of nicotine dependence: Secondary | ICD-10-CM | POA: Diagnosis not present

## 2017-06-11 DIAGNOSIS — Z3483 Encounter for supervision of other normal pregnancy, third trimester: Secondary | ICD-10-CM

## 2017-06-11 SURGERY — Surgical Case
Anesthesia: Spinal | Site: Abdomen | Wound class: Clean Contaminated

## 2017-06-11 MED ORDER — EPHEDRINE SULFATE 50 MG/ML IJ SOLN
INTRAMUSCULAR | Status: DC | PRN
  Administered 2017-06-11: 20 mg via INTRAVENOUS

## 2017-06-11 MED ORDER — PHENYLEPHRINE 8 MG IN D5W 100 ML (0.08MG/ML) PREMIX OPTIME
INJECTION | INTRAVENOUS | Status: DC | PRN
  Administered 2017-06-11: 60 ug/min via INTRAVENOUS

## 2017-06-11 MED ORDER — FENTANYL CITRATE (PF) 100 MCG/2ML IJ SOLN
INTRAMUSCULAR | Status: DC | PRN
  Administered 2017-06-11: 20 ug via INTRATHECAL

## 2017-06-11 MED ORDER — LACTATED RINGERS IV SOLN
INTRAVENOUS | Status: DC | PRN
  Administered 2017-06-11 (×2): via INTRAVENOUS

## 2017-06-11 MED ORDER — SENNOSIDES-DOCUSATE SODIUM 8.6-50 MG PO TABS
2.0000 | ORAL_TABLET | ORAL | Status: DC
  Administered 2017-06-11 – 2017-06-13 (×3): 2 via ORAL
  Filled 2017-06-11 (×3): qty 2

## 2017-06-11 MED ORDER — DIBUCAINE 1 % RE OINT: 1.0000 "application " | TOPICAL_OINTMENT | RECTAL | Status: DC | PRN

## 2017-06-11 MED ORDER — NALBUPHINE HCL 10 MG/ML IJ SOLN: 5.0000 mg | Freq: Once | INTRAMUSCULAR | Status: DC | PRN

## 2017-06-11 MED ORDER — LACTATED RINGERS IV SOLN
INTRAVENOUS | Status: DC
  Administered 2017-06-11 – 2017-06-12 (×2): via INTRAVENOUS

## 2017-06-11 MED ORDER — CEFAZOLIN SODIUM-DEXTROSE 2-4 GM/100ML-% IV SOLN
2.0000 g | INTRAVENOUS | Status: AC
  Administered 2017-06-11: 3 g via INTRAVENOUS
  Filled 2017-06-11: qty 100

## 2017-06-11 MED ORDER — DIPHENHYDRAMINE HCL 25 MG PO CAPS: 25.0000 mg | ORAL_CAPSULE | Freq: Four times a day (QID) | ORAL | Status: DC | PRN

## 2017-06-11 MED ORDER — SIMETHICONE 80 MG PO CHEW: 80.0000 mg | CHEWABLE_TABLET | ORAL | Status: DC | PRN

## 2017-06-11 MED ORDER — ONDANSETRON HCL 4 MG/2ML IJ SOLN
INTRAMUSCULAR | Status: DC | PRN
  Administered 2017-06-11: 4 mg via INTRAVENOUS

## 2017-06-11 MED ORDER — NALOXONE HCL 2 MG/2ML IJ SOSY
1.0000 ug/kg/h | PREFILLED_SYRINGE | INTRAVENOUS | Status: DC | PRN
  Filled 2017-06-11: qty 2

## 2017-06-11 MED ORDER — TETANUS-DIPHTH-ACELL PERTUSSIS 5-2.5-18.5 LF-MCG/0.5 IM SUSP: 0.5000 mL | Freq: Once | INTRAMUSCULAR | Status: DC

## 2017-06-11 MED ORDER — KETOROLAC TROMETHAMINE 30 MG/ML IJ SOLN: 30.0000 mg | Freq: Once | INTRAMUSCULAR | Status: DC | PRN

## 2017-06-11 MED ORDER — SIMETHICONE 80 MG PO CHEW
80.0000 mg | CHEWABLE_TABLET | Freq: Three times a day (TID) | ORAL | Status: DC
  Administered 2017-06-11 – 2017-06-14 (×9): 80 mg via ORAL
  Filled 2017-06-11 (×9): qty 1

## 2017-06-11 MED ORDER — SCOPOLAMINE 1 MG/3DAYS TD PT72
MEDICATED_PATCH | TRANSDERMAL | Status: AC
  Filled 2017-06-11: qty 1

## 2017-06-11 MED ORDER — IBUPROFEN 600 MG PO TABS
600.0000 mg | ORAL_TABLET | Freq: Four times a day (QID) | ORAL | Status: DC
  Administered 2017-06-11 – 2017-06-14 (×11): 600 mg via ORAL
  Filled 2017-06-11 (×12): qty 1

## 2017-06-11 MED ORDER — DEXTROSE 5 % IV SOLN
INTRAVENOUS | Status: AC
  Filled 2017-06-11: qty 3000

## 2017-06-11 MED ORDER — WITCH HAZEL-GLYCERIN EX PADS: 1.0000 "application " | MEDICATED_PAD | CUTANEOUS | Status: DC | PRN

## 2017-06-11 MED ORDER — LACTATED RINGERS IV SOLN
INTRAVENOUS | Status: DC | PRN
  Administered 2017-06-11: 40 [IU] via INTRAVENOUS

## 2017-06-11 MED ORDER — DEXAMETHASONE SODIUM PHOSPHATE 4 MG/ML IJ SOLN
INTRAMUSCULAR | Status: AC
  Filled 2017-06-11: qty 1

## 2017-06-11 MED ORDER — OXYTOCIN 40 UNITS IN LACTATED RINGERS INFUSION - SIMPLE MED: 2.5000 [IU]/h | INTRAVENOUS | Status: AC

## 2017-06-11 MED ORDER — SCOPOLAMINE 1 MG/3DAYS TD PT72: 1.0000 | MEDICATED_PATCH | Freq: Once | TRANSDERMAL | Status: DC

## 2017-06-11 MED ORDER — MEPERIDINE HCL 25 MG/ML IJ SOLN: 6.2500 mg | INTRAMUSCULAR | Status: DC | PRN

## 2017-06-11 MED ORDER — DIPHENHYDRAMINE HCL 25 MG PO CAPS
25.0000 mg | ORAL_CAPSULE | ORAL | Status: DC | PRN
  Filled 2017-06-11: qty 1

## 2017-06-11 MED ORDER — LACTATED RINGERS IV SOLN
INTRAVENOUS | Status: DC | PRN
  Administered 2017-06-11: 08:00:00 via INTRAVENOUS

## 2017-06-11 MED ORDER — ACETAMINOPHEN 325 MG PO TABS
650.0000 mg | ORAL_TABLET | ORAL | Status: DC | PRN
  Administered 2017-06-12: 650 mg via ORAL
  Filled 2017-06-11 (×2): qty 2

## 2017-06-11 MED ORDER — COCONUT OIL OIL: 1.0000 "application " | TOPICAL_OIL | Status: DC | PRN

## 2017-06-11 MED ORDER — KETOROLAC TROMETHAMINE 30 MG/ML IJ SOLN: 30.0000 mg | Freq: Four times a day (QID) | INTRAMUSCULAR | Status: AC | PRN

## 2017-06-11 MED ORDER — SCOPOLAMINE 1 MG/3DAYS TD PT72
1.0000 | MEDICATED_PATCH | Freq: Once | TRANSDERMAL | Status: DC
  Administered 2017-06-11: 1.5 mg via TRANSDERMAL

## 2017-06-11 MED ORDER — ONDANSETRON HCL 4 MG/2ML IJ SOLN: 4.0000 mg | Freq: Three times a day (TID) | INTRAMUSCULAR | Status: DC | PRN

## 2017-06-11 MED ORDER — BUPIVACAINE IN DEXTROSE 0.75-8.25 % IT SOLN
INTRATHECAL | Status: AC
  Filled 2017-06-11: qty 2

## 2017-06-11 MED ORDER — ZOLPIDEM TARTRATE 5 MG PO TABS: 5.0000 mg | ORAL_TABLET | Freq: Every evening | ORAL | Status: DC | PRN

## 2017-06-11 MED ORDER — ONDANSETRON HCL 4 MG/2ML IJ SOLN
INTRAMUSCULAR | Status: AC
  Filled 2017-06-11: qty 2

## 2017-06-11 MED ORDER — EPHEDRINE 5 MG/ML INJ
INTRAVENOUS | Status: AC
  Filled 2017-06-11: qty 10

## 2017-06-11 MED ORDER — FENTANYL CITRATE (PF) 100 MCG/2ML IJ SOLN
INTRAMUSCULAR | Status: AC
  Filled 2017-06-11: qty 2

## 2017-06-11 MED ORDER — DIPHENHYDRAMINE HCL 50 MG/ML IJ SOLN: 12.5000 mg | INTRAMUSCULAR | Status: DC | PRN

## 2017-06-11 MED ORDER — NALBUPHINE HCL 10 MG/ML IJ SOLN: 5.0000 mg | INTRAMUSCULAR | Status: DC | PRN

## 2017-06-11 MED ORDER — BUPIVACAINE IN DEXTROSE 0.75-8.25 % IT SOLN
INTRATHECAL | Status: DC | PRN
  Administered 2017-06-11: 1.6 mL via INTRATHECAL

## 2017-06-11 MED ORDER — ACETAMINOPHEN 500 MG PO TABS
1000.0000 mg | ORAL_TABLET | Freq: Four times a day (QID) | ORAL | Status: AC
  Administered 2017-06-11 – 2017-06-12 (×4): 1000 mg via ORAL
  Filled 2017-06-11 (×5): qty 2

## 2017-06-11 MED ORDER — DEXAMETHASONE SODIUM PHOSPHATE 4 MG/ML IJ SOLN
INTRAMUSCULAR | Status: DC | PRN
  Administered 2017-06-11: 4 mg via INTRAVENOUS

## 2017-06-11 MED ORDER — SODIUM CHLORIDE 0.9 % IR SOLN
Status: DC | PRN
  Administered 2017-06-11: 1

## 2017-06-11 MED ORDER — MORPHINE SULFATE (PF) 0.5 MG/ML IJ SOLN
INTRAMUSCULAR | Status: AC
  Filled 2017-06-11: qty 10

## 2017-06-11 MED ORDER — SODIUM CHLORIDE 0.9% FLUSH: 3.0000 mL | INTRAVENOUS | Status: DC | PRN

## 2017-06-11 MED ORDER — MENTHOL 3 MG MT LOZG: 1.0000 | LOZENGE | OROMUCOSAL | Status: DC | PRN

## 2017-06-11 MED ORDER — PROMETHAZINE HCL 25 MG/ML IJ SOLN: 12.5000 mg | Freq: Once | INTRAMUSCULAR | Status: DC | PRN

## 2017-06-11 MED ORDER — FENTANYL CITRATE (PF) 100 MCG/2ML IJ SOLN: 25.0000 ug | INTRAMUSCULAR | Status: DC | PRN

## 2017-06-11 MED ORDER — PRENATAL MULTIVITAMIN CH
1.0000 | ORAL_TABLET | Freq: Every day | ORAL | Status: DC
  Administered 2017-06-11 – 2017-06-13 (×3): 1 via ORAL
  Filled 2017-06-11 (×3): qty 1

## 2017-06-11 MED ORDER — OXYTOCIN 10 UNIT/ML IJ SOLN
INTRAMUSCULAR | Status: AC
  Filled 2017-06-11: qty 4

## 2017-06-11 MED ORDER — SIMETHICONE 80 MG PO CHEW
80.0000 mg | CHEWABLE_TABLET | ORAL | Status: DC
  Administered 2017-06-11 – 2017-06-13 (×3): 80 mg via ORAL
  Filled 2017-06-11 (×3): qty 1

## 2017-06-11 MED ORDER — NALOXONE HCL 0.4 MG/ML IJ SOLN: 0.4000 mg | INTRAMUSCULAR | Status: DC | PRN

## 2017-06-11 MED ORDER — MORPHINE SULFATE (PF) 0.5 MG/ML IJ SOLN
INTRAMUSCULAR | Status: DC | PRN
  Administered 2017-06-11: .2 mg via INTRATHECAL

## 2017-06-11 SURGICAL SUPPLY — 39 items
BENZOIN TINCTURE PRP APPL 2/3 (GAUZE/BANDAGES/DRESSINGS) ×3 IMPLANT
CHLORAPREP W/TINT 26ML (MISCELLANEOUS) ×3 IMPLANT
CLAMP CORD UMBIL (MISCELLANEOUS) IMPLANT
CLOSURE STERI STRIP 1/2 X4 (GAUZE/BANDAGES/DRESSINGS) ×6 IMPLANT
CLOSURE WOUND 1/2 X4 (GAUZE/BANDAGES/DRESSINGS)
CLOTH BEACON ORANGE TIMEOUT ST (SAFETY) ×3 IMPLANT
DRSG OPSITE POSTOP 4X10 (GAUZE/BANDAGES/DRESSINGS) ×3 IMPLANT
DRSG OPSITE POSTOP 4X12 (GAUZE/BANDAGES/DRESSINGS) ×3 IMPLANT
ELECT REM PT RETURN 9FT ADLT (ELECTROSURGICAL) ×3
ELECTRODE REM PT RTRN 9FT ADLT (ELECTROSURGICAL) ×1 IMPLANT
EXTRACTOR VACUUM KIWI (MISCELLANEOUS) IMPLANT
GLOVE BIO SURGEON ST LM GN SZ9 (GLOVE) ×3 IMPLANT
GLOVE BIOGEL PI IND STRL 7.0 (GLOVE) ×1 IMPLANT
GLOVE BIOGEL PI IND STRL 9 (GLOVE) ×1 IMPLANT
GLOVE BIOGEL PI INDICATOR 7.0 (GLOVE) ×2
GLOVE BIOGEL PI INDICATOR 9 (GLOVE) ×2
GOWN STRL REUS W/TWL 2XL LVL3 (GOWN DISPOSABLE) ×3 IMPLANT
GOWN STRL REUS W/TWL LRG LVL3 (GOWN DISPOSABLE) ×3 IMPLANT
HOVERMATT SINGLE USE (MISCELLANEOUS) ×3 IMPLANT
NEEDLE HYPO 25X5/8 SAFETYGLIDE (NEEDLE) IMPLANT
NS IRRIG 1000ML POUR BTL (IV SOLUTION) ×3 IMPLANT
PACK C SECTION WH (CUSTOM PROCEDURE TRAY) ×3 IMPLANT
PAD OB MATERNITY 4.3X12.25 (PERSONAL CARE ITEMS) ×3 IMPLANT
PENCIL SMOKE EVAC W/HOLSTER (ELECTROSURGICAL) ×3 IMPLANT
RTRCTR C-SECT PINK 25CM LRG (MISCELLANEOUS) IMPLANT
RTRCTR C-SECT PINK 34CM XLRG (MISCELLANEOUS) ×3 IMPLANT
STRIP CLOSURE SKIN 1/2X4 (GAUZE/BANDAGES/DRESSINGS) IMPLANT
SUT MNCRL 0 VIOLET CTX 36 (SUTURE) ×2 IMPLANT
SUT MONOCRYL 0 CTX 36 (SUTURE) ×4
SUT PLAIN 2 0 (SUTURE) ×2
SUT PLAIN ABS 2-0 CT1 27XMFL (SUTURE) ×1 IMPLANT
SUT VIC AB 0 CT1 27 (SUTURE) ×2
SUT VIC AB 0 CT1 27XBRD ANBCTR (SUTURE) ×1 IMPLANT
SUT VIC AB 2-0 CT1 27 (SUTURE) ×4
SUT VIC AB 2-0 CT1 TAPERPNT 27 (SUTURE) ×2 IMPLANT
SUT VIC AB 4-0 KS 27 (SUTURE) ×3 IMPLANT
SYR BULB IRRIGATION 50ML (SYRINGE) IMPLANT
TOWEL OR 17X24 6PK STRL BLUE (TOWEL DISPOSABLE) ×3 IMPLANT
TRAY FOLEY BAG SILVER LF 14FR (SET/KITS/TRAYS/PACK) ×3 IMPLANT

## 2017-06-11 NOTE — Transfer of Care (Signed)
Immediate Anesthesia Transfer of Care Note  Patient: Denise MayerMichaela Buck  Procedure(s) Performed: REPEAT CESAREAN SECTION (N/A Abdomen)  Patient Location: PACU  Anesthesia Type:Spinal  Level of Consciousness: awake, alert , oriented and patient cooperative  Airway & Oxygen Therapy: Patient Spontanous Breathing  Post-op Assessment: Report given to RN and Post -op Vital signs reviewed and stable  Post vital signs: Reviewed and stable  Last Vitals:  Vitals:   06/11/17 0601 06/11/17 0620  BP: 138/75   Pulse: 98   Temp:  36.9 C    Last Pain:  Vitals:   06/11/17 0620  TempSrc: Oral         Complications: No apparent anesthesia complications

## 2017-06-11 NOTE — Anesthesia Preprocedure Evaluation (Signed)
Anesthesia Evaluation  Patient identified by MRN, date of birth, ID band Patient awake    Reviewed: Allergy & Precautions, H&P , NPO status , Patient's Chart, lab work & pertinent test results  Airway Mallampati: II  TM Distance: >3 FB Neck ROM: full    Dental no notable dental hx. (+) Teeth Intact   Pulmonary former smoker,    Pulmonary exam normal breath sounds clear to auscultation       Cardiovascular hypertension, negative cardio ROS Normal cardiovascular exam Rhythm:regular Rate:Normal     Neuro/Psych negative neurological ROS  negative psych ROS   GI/Hepatic negative GI ROS, Neg liver ROS,   Endo/Other  Morbid obesity  Renal/GU negative Renal ROS     Musculoskeletal   Abdominal (+) + obese,   Peds  Hematology negative hematology ROS (+)   Anesthesia Other Findings   Reproductive/Obstetrics (+) Pregnancy                             Anesthesia Physical Anesthesia Plan  ASA: III  Anesthesia Plan: Spinal   Post-op Pain Management:    Induction:   PONV Risk Score and Plan: 3 and Ondansetron, Dexamethasone and Midazolam  Airway Management Planned: Natural Airway and Nasal Cannula  Additional Equipment:   Intra-op Plan:   Post-operative Plan:   Informed Consent: I have reviewed the patients History and Physical, chart, labs and discussed the procedure including the risks, benefits and alternatives for the proposed anesthesia with the patient or authorized representative who has indicated his/her understanding and acceptance.     Plan Discussed with: CRNA and Surgeon  Anesthesia Plan Comments:         Anesthesia Quick Evaluation

## 2017-06-11 NOTE — Anesthesia Procedure Notes (Signed)
Spinal  Start time: 06/11/2017 7:42 AM End time: 06/11/2017 7:49 AM Staffing Anesthesiologist: Leilani AbleHATCHETT, Rayan Ines Performed: anesthesiologist  Preanesthetic Checklist Completed: patient identified, surgical consent, pre-op evaluation, timeout performed, IV checked, risks and benefits discussed and monitors and equipment checked Spinal Block Patient position: sitting Prep: site prepped and draped and DuraPrep Patient monitoring: heart rate, cardiac monitor, continuous pulse ox and blood pressure Approach: midline Location: L3-4 Injection technique: single-shot Needle Needle type: Pencan  Needle gauge: 24 G Needle length: 10 cm Needle insertion depth: 7 cm Assessment Sensory level: T2

## 2017-06-11 NOTE — Op Note (Signed)
Please see the brief Op note for surgical details.

## 2017-06-11 NOTE — Anesthesia Postprocedure Evaluation (Signed)
Anesthesia Post Note  Patient: Denise Buck  Procedure(s) Performed: REPEAT CESAREAN SECTION (N/A Abdomen)     Patient location during evaluation: PACU Anesthesia Type: Spinal Level of consciousness: awake Pain management: pain level controlled Vital Signs Assessment: post-procedure vital signs reviewed and stable Respiratory status: spontaneous breathing Cardiovascular status: stable Postop Assessment: no headache, no backache, spinal receding, no apparent nausea or vomiting and patient able to bend at knees Anesthetic complications: no    Last Vitals:  Vitals:   06/11/17 0940 06/11/17 0945  BP:  115/70  Pulse: 68 78  Resp: 20 (!) 22  Temp:    SpO2: 99% 98%    Last Pain:  Vitals:   06/11/17 0945  TempSrc:   PainSc: Asleep   Pain Goal:                 [name] JR,JOHN Grettell Ransdell

## 2017-06-11 NOTE — Anesthesia Postprocedure Evaluation (Cosign Needed)
Anesthesia Post Note  Patient: Denise Buck  Procedure(s) Performed: REPEAT CESAREAN SECTION (N/A Abdomen)     Patient location during evaluation: Mother Baby Anesthesia Type: Spinal Level of consciousness: awake and alert, oriented and patient cooperative Pain management: pain level controlled Vital Signs Assessment: post-procedure vital signs reviewed and stable Respiratory status: spontaneous breathing, respiratory function stable and nonlabored ventilation Cardiovascular status: blood pressure returned to baseline and stable Postop Assessment: no headache, no backache, no apparent nausea or vomiting, patient able to bend at knees and spinal receding (Patient reports that she has walked to the restroom.) Anesthetic complications: no    Last Vitals:  Vitals:   06/11/17 1240 06/11/17 1350  BP: 122/65   Pulse: 68 69  Resp: 18   Temp: 36.7 C 36.7 C  SpO2: 97% 98%    Last Pain:  Vitals:   06/11/17 1350  TempSrc: Oral  PainSc: 0-No pain   Pain Goal:                 Ballard RussellLeah  Latonyia Lopata

## 2017-06-11 NOTE — Brief Op Note (Signed)
06/11/2017  8:51 AM  PATIENT:  Denise MayerMichaela Askari  26 y.o. female  PRE-OPERATIVE DIAGNOSIS:  REPEAT C SECTION pregnancy 39 wks, declining TOLAC  POST-OPERATIVE DIAGNOSIS:  Repeat C section;  Pregnancy 39wks,Declinig TOLAC, completed  PROCEDURE:  Procedure(s): REPEAT CESAREAN SECTION (N/A) low transverse incision  SURGEON:  Surgeon(s) and Role:    * Tilda BurrowFerguson, Kimon Loewen V, MD - Primary  PHYSICIAN ASSISTANT:   ASSISTANTS: none   ANESTHESIA:   spinal  EBL:  746 mL   BLOOD ADMINISTERED:none  DRAINS: Urinary Catheter (Foley)   LOCAL MEDICATIONS USED:  NONE  SPECIMEN:  Source of Specimen:  placenta to L&D.  DISPOSITION OF SPECIMEN:  L&D  COUNTS:  YES  TOURNIQUET:  * No tourniquets in log *  DICTATION: .Dragon Dictation  PLAN OF CARE: Admit to inpatient   PATIENT DISPOSITION:  PACU - hemodynamically stable.   Delay start of Pharmacological VTE agent (>24hrs) due to surgical blood loss or risk of bleeding: not applicable Details of procedure patient was taken operating room prepped and draped for lower abdominal surgery with a transverse incision made along the lines of the previous incision excising a 1 cm wide old cicatrix. The fascia was opened transversely, elevated off the muscles and peritoneal cavity entered easily. Alexis retractor was positioned. Bladder flap was developed. Transverse uterine incision performed without difficulty and fetal vertex delivered easily through the incision without difficulty. The umbilical cord Was clamped at 1 minute and the baby passed to the pediatricians. Placenta delivered easily after cord blood samples obtained. The uterus was closed in a 2 layer fashion running locking 0 Monocryl first layer and continuous running 0 Monocryl second layer closure. Peritoneum was closed with 2-0 Vicryl after removal of the Alexis wound retractor, the fascia closed with 0 Vicryl, the subcutaneous tissues reapproximated with 2-0 plain suture horizontal mattress  sutures and then subcuticular 4-0 Vicryl closure of the skin and Steri-Strips with benzoin applied to the skin by honeycomb dressing. Sponge and needle counts correct

## 2017-06-11 NOTE — H&P (Signed)
Denise Buck is a 26 y.o. female presenting for repeat cesarean section at 39wks. She declines multiple offers to try TOLAC. She plans BC- Nexplanon. Risks, potential complications and likely outcomes have been reviewed.. OB History    Gravida Para Term Preterm AB Living   3 2 1 1   1    SAB TAB Ectopic Multiple Live Births           2     Past Medical History:  Diagnosis Date  . Chlamydia    Past Surgical History:  Procedure Laterality Date  . CESAREAN SECTION    . CHOLECYSTECTOMY    . WISDOM TOOTH EXTRACTION     Family History: family history includes Autism in her maternal uncle; Cancer in her paternal aunt and paternal grandmother. Social History:  reports that she has quit smoking. She has never used smokeless tobacco. She reports that she does not drink alcohol or use drugs.     Maternal Diabetes: No Genetic Screening: Normal Maternal Ultrasounds/Referrals: Normal Fetal Ultrasounds or other Referrals:  None Maternal Substance Abuse:  No Significant Maternal Medications:  None Significant Maternal Lab Results:  Lab values include: Group B Strep positive Other Comments:  None  ROS History   Blood pressure 138/75, pulse 98, temperature 98.4 F (36.9 C), temperature source Oral, height 5\' 7"  (1.702 m), weight 300 lb (136.1 kg), last menstrual period 09/09/2016. Exam Physical Exam  Constitutional: She is oriented to person, place, and time. She appears well-developed and well-nourished.  HENT:  Head: Normocephalic.  Eyes: Pupils are equal, round, and reactive to light. EOM are normal.  Neck: Normal range of motion.  Cardiovascular: Normal rate.   Respiratory: Effort normal and breath sounds normal.  GI: Soft.  Gravid uterus, eEFW 8.5 pounds.  Genitourinary: Vagina normal.  Musculoskeletal: Normal range of motion.  Neurological: She is alert and oriented to person, place, and time. She has normal reflexes.  Skin: Skin is warm and dry.  Psychiatric: She has a  normal mood and affect. Her behavior is normal. Thought content normal.    Prenatal labs: ABO, Rh: --/--/O POS (10/19 0935) Antibody: NEG (10/19 0935) Rubella: 2.42 (04/02 1030) RPR: Non Reactive (10/19 0930)  HBsAg: Negative (04/02 1030)  HIV:    GBS:   pos  Assessment/Plan: Pregnancy 39 wk  Prior cesarean, not for TOLAC.  Repeat cesarean section.   Eshaal Duby V 06/11/2017, 7:19 AM

## 2017-06-11 NOTE — Addendum Note (Signed)
Addendum  created 06/11/17 1657 by Ballard RussellBezzo, Keylie Beavers, RN   Sign clinical note

## 2017-06-12 DIAGNOSIS — Z3A39 39 weeks gestation of pregnancy: Secondary | ICD-10-CM

## 2017-06-12 DIAGNOSIS — O34211 Maternal care for low transverse scar from previous cesarean delivery: Secondary | ICD-10-CM

## 2017-06-12 LAB — CBC
HEMATOCRIT: 30.8 % — AB (ref 36.0–46.0)
HEMOGLOBIN: 10.3 g/dL — AB (ref 12.0–15.0)
MCH: 29.4 pg (ref 26.0–34.0)
MCHC: 33.4 g/dL (ref 30.0–36.0)
MCV: 88 fL (ref 78.0–100.0)
Platelets: 189 10*3/uL (ref 150–400)
RBC: 3.5 MIL/uL — AB (ref 3.87–5.11)
RDW: 14.6 % (ref 11.5–15.5)
WBC: 8.8 10*3/uL (ref 4.0–10.5)

## 2017-06-12 LAB — BIRTH TISSUE RECOVERY COLLECTION (PLACENTA DONATION)

## 2017-06-12 MED ORDER — OXYCODONE HCL 5 MG PO TABS
5.0000 mg | ORAL_TABLET | ORAL | Status: DC | PRN
  Administered 2017-06-12 (×2): 5 mg via ORAL
  Filled 2017-06-12 (×2): qty 1

## 2017-06-12 MED ORDER — OXYCODONE HCL 5 MG PO TABS
10.0000 mg | ORAL_TABLET | ORAL | Status: DC | PRN
  Administered 2017-06-12 – 2017-06-14 (×9): 10 mg via ORAL
  Filled 2017-06-12 (×10): qty 2

## 2017-06-12 NOTE — Progress Notes (Signed)
Post Partum Day 1 Subjective: no complaints, up ad lib, voiding and tolerating PO  Objective: Blood pressure (!) 114/59, pulse 86, temperature 97.8 F (36.6 C), temperature source Oral, resp. rate 16, height 5\' 7"  (1.702 m), weight 300 lb (136.1 kg), last menstrual period 09/09/2016, SpO2 100 %, unknown if currently breastfeeding.  Physical Exam:  General: alert, cooperative and no distress Lochia: appropriate Uterine Fundus: firm Incision: healing well, no significant drainage, no dehiscence, no significant erythema DVT Evaluation: No evidence of DVT seen on physical exam. No cords or calf tenderness.   Recent Labs  06/12/17 0530  HGB 10.3*  HCT 30.8*    Assessment/Plan: Plan for discharge tomorrow, Breastfeeding and Contraception nexplanon   LOS: 1 day   Denise Buck 06/12/2017, 9:31 AM   CNM attestation Post Partum Day #1 I have seen and examined this patient and agree with above documentation in the resident's note.   Denise Buck is a 26 y.o. G3P2101 s/p rLTCS.  Pt denies problems with ambulating, voiding or po intake. Pain is well controlled.  Plan for birth control is Nexplanon.  Method of Feeding: bottle  PE:  BP (!) 114/59 (BP Location: Left Arm)   Pulse 86   Temp 97.8 F (36.6 C) (Oral)   Resp 16   Ht 5\' 7"  (1.702 m)   Wt 136.1 kg (300 lb)   LMP 09/09/2016 (Exact Date)   SpO2 100%   Breastfeeding? Unknown   BMI 46.99 kg/m  Fundus firm  Plan for discharge: 06/13/17  Cam HaiSHAW, Felice Hope, CNM 2:13 PM 06/12/2017

## 2017-06-12 NOTE — Progress Notes (Signed)
Changed mom's bedding.

## 2017-06-13 ENCOUNTER — Encounter (HOSPITAL_COMMUNITY): Payer: Self-pay | Admitting: *Deleted

## 2017-06-13 NOTE — Progress Notes (Signed)
CSW assessment completed.  No barriers to discharge.  Full documentation to follow. 

## 2017-06-13 NOTE — Plan of Care (Signed)
Problem: Pain Management: Goal: General experience of comfort will improve and pain level will decrease Outcome: Not Met (add Reason) Patient states that she is experiencing incisional discomfort which she describes as severe (7-10) burning and soreness as the oxycodone wears off. Face pain scale 2-4. Patient is ambulating and moving freely in her room. Patient aware of and in accord with MD orders for oxycodone 5-10 mg. PO every 4 hours PRN.   Problem: Bowel/Gastric: Goal: Gastrointestinal status will improve Outcome: Completed/Met Date Met: 06/13/17 Patient has had three bowel movements since having her surgery.

## 2017-06-13 NOTE — Clinical Social Work Maternal (Signed)
CLINICAL SOCIAL WORK MATERNAL/CHILD NOTE  Patient Details  Name: Denise Buck MRN: [id number] Date of Birth: 1991/06/25  Date:  06/13/2017  Clinical Social Worker Initiating Note:  Terri Piedra, [place] Date/Time: Initiated:  06/13/17/0845     Child's Name:  Denise Buck   Biological Parents:  Mother, Father Silena Wyss and [name])   Need for Interpreter:  None   Reason for Referral:  Current Substance Use/Substance Use During Pregnancy    Address:  [place] [address]    Phone number:  [phone] (home)     Additional phone number:      Household Members/Support Persons (HM/SP):   Household Member/Support Person 1   HM/SP Name Relationship DOB or Age  HM/SP -1 Addisson Frate son  25/01/2011  HM/SP -2        HM/SP -3        HM/SP -4        HM/SP -5        HM/SP -6        HM/SP -7        HM/SP -8          Natural Supports (not living in the home):  Parent, Other (Comment) (MOB states her mother and step-father are her greatest supports.)   Professional Supports: None   Employment:     Type of Work:     Education:      Homebound arranged:    Museum/gallery curator Resources:  Medicaid, Multimedia programmer   Other Resources:      Cultural/Religious Considerations Which May Impact Care: None stated.  Strengths:  Ability to meet basic needs , Home prepared for child , Pediatrician chosen   Psychotropic Medications:         Pediatrician:    [facility]  Pediatrician List:   [facility] Other ([facility])  [facility]      Pediatrician Fax Number:    Risk Factors/Current Problems:  Substance Use    Cognitive State:  Alert , Able to Concentrate , Linear Thinking    Mood/Affect:  Comfortable , Calm    CSW Assessment: CSW met with parents in MOB's first floor room/132 to offer support and complete assessment due to  marijuana use in pregnancy.  MOB had a positive screen on 11/20/16 and 03/29/17 for THC.  MOB was quiet, but pleasant and agreed to meeting with CSW.  She gave consent to speak with FOB present.   MOB reports that pregnancy, labor and delivery went well.  She states this is her second child.  Her almost 25 year old son Denise Buck is in kindergarten at [employer] in [place].  She spoke about the emotions associated with that transition, especially while being pregnant, but states that overall, she has felt well emotionally.  She reports no emotional concerns at this time or during the postpartum time period after her first son's birth.  She states she has everything she needs for baby and is aware of SIDS precautions.  MOB was receptive to information CSW provided about PMADs.  FOB is not the father of MOB's other child, but states he has a 57 year old and 25 year old with another mother.  FOB does not reside in the home and states he lives in [place].  MOB states they are not currently in a relationship, but both state they are "trying to  figure things out."   CSW inquired about substance use.  MOB states she spoke to CNM about marijuana use in early pregnancy because she was so sick and "they didn't give me any nausea medicine in the beginning and the medicine they gave me didn't work."  She reports no use after April 2018.  CSW inquired about the positive screen in August and MOB states she was "just around it (marijuana)."  She denies smoking it.  FOB offered that he smokes marijuana frequently and that "it's about to be legal in a couple months.  I read about it."  CSW explained that it is not currently legal and informed parents of hospital drug screen policy and baby's negative UDS and pending CDS.  Parents stated understanding and no concerns.  They both state an understanding of mandatory reporting and MOB states her mother is a Librarian, academic at [employer] in [place].  She reports that her mother  has knowledge of her hx of marijuana use.      CSW Plan/Description:  No Further Intervention Required/No Barriers to Discharge, Sudden Infant Death Syndrome (SIDS) Education, Perinatal Mood and Anxiety Disorder (PMADs) Education, [place], CSW Will Continue to Monitor Umbilical Cord Tissue Drug Screen Results and Make Report if Barnetta Chapel 06/13/2017, 4:10 PM

## 2017-06-13 NOTE — Progress Notes (Signed)
Subjective: Postpartum Day 2: Cesarean Delivery Eating, drinking, voiding, ambulating well.  +flatus and bm.  Lochia and pain wnl.  Denies dizziness, lightheadedness, or sob. No complaints. Wants to stay another night.   Objective: Vital signs in last 24 hours: Temp:  [97.8 F (36.6 C)-98.8 F (37.1 C)] 98.1 F (36.7 C) (10/24 0510) Pulse Rate:  [86-93] 93 (10/24 0510) Resp:  [16-18] 18 (10/24 0510) BP: (114-127)/(59-81) 125/66 (10/24 0510) SpO2:  [100 %] 100 % (10/23 0806)  Physical Exam:  General: alert, cooperative and no distress Lochia: appropriate Uterine Fundus: firm Incision: healing well, no significant drainage, no dehiscence, no significant erythema, honeycomb intact DVT Evaluation: No evidence of DVT seen on physical exam. Negative Homan's sign. No cords or calf tenderness. No significant calf/ankle edema.   Recent Labs  06/12/17 0530  HGB 10.3*  HCT 30.8*    Assessment/Plan: Status post Cesarean section. Doing well postoperatively.  Continue current care. Plans Nexplanon for contraception Circ @ [facility]  Marge DuncansBooker, Kimberly Randall 06/13/2017, 7:10 AM

## 2017-06-14 MED ORDER — SENNOSIDES-DOCUSATE SODIUM 8.6-50 MG PO TABS: 2.0000 | ORAL_TABLET | Freq: Every evening | ORAL | 1 refills | Status: DC | PRN

## 2017-06-14 MED ORDER — OXYCODONE HCL 5 MG PO TABS: 5.0000 mg | ORAL_TABLET | Freq: Four times a day (QID) | ORAL | 0 refills | Status: DC | PRN

## 2017-06-14 MED ORDER — IBUPROFEN 600 MG PO TABS: 600.0000 mg | ORAL_TABLET | Freq: Four times a day (QID) | ORAL | 0 refills | Status: DC

## 2017-06-14 NOTE — Discharge Summary (Signed)
OB Discharge Summary     Patient Name: Denise MayerMichaela Buck DOB: May 06, 1991 MRN: [id number]  Date of admission: 06/11/2017 Delivering MD: Tilda BurrowFERGUSON, JOHN V   Date of discharge: 06/14/2017  Admitting diagnosis: BIRTH Intrauterine pregnancy: 6775w2d     Secondary diagnosis:  Principal Problem:   Status post repeat low transverse cesarean section Active Problems:   Previous cesarean section complicating pregnancy  Additional problems: none     Discharge diagnosis: Term Pregnancy Delivered                                                                                                Post partum procedures:none  Augmentation: none  Complications: None  Hospital course:  Sceduled C/S   26 y.o. yo G3P2101 at 6875w2d was admitted to the hospital 06/11/2017 for scheduled cesarean section with the following indication:Elective Repeat.  Membrane Rupture Time/Date: 8:09 AM ,06/11/2017   Patient delivered a Viable infant.06/11/2017  Details of operation can be found in separate operative note.  Pateint had an uncomplicated postpartum course.  She is ambulating, tolerating a regular diet, passing flatus, and urinating well. Patient is discharged home in stable condition on  06/15/17         Physical exam  Vitals:   06/12/17 1821 06/13/17 0510 06/13/17 1803 06/14/17 0508  BP: 127/81 125/66 124/64 129/67  Pulse: 91 93 91 85  Resp: 16 18 18 20   Temp: 98.8 F (37.1 C) 98.1 F (36.7 C) 98 F (36.7 C) 98.2 F (36.8 C)  TempSrc: Axillary Tympanic Oral Oral  SpO2:    100%  Weight:      Height:       General: alert, cooperative and no distress Lochia: appropriate Uterine Fundus: firm Incision: Healing well with no significant drainage, Dressing is clean, dry, and intact DVT Evaluation: No evidence of DVT seen on physical exam. Labs: Lab Results  Component Value Date   WBC 8.8 06/12/2017   HGB 10.3 (L) 06/12/2017   HCT 30.8 (L) 06/12/2017   MCV 88.0 06/12/2017   PLT 189 06/12/2017    CMP Latest Ref Rng & Units 01/03/2017  Glucose 65 - 99 mg/dL 89  BUN 6 - 20 mg/dL 11  Creatinine 4.090.44 - 8.111.00 mg/dL 9.140.75  Sodium 782135 - 956145 mmol/L 137  Potassium 3.5 - 5.1 mmol/L 3.6  Chloride 101 - 111 mmol/L 108  CO2 22 - 32 mmol/L 23  Calcium 8.9 - 10.3 mg/dL 9.0  Total Protein 6.5 - 8.1 g/dL -  Total Bilirubin 0.3 - 1.2 mg/dL -  Alkaline Phos 38 - 213126 U/L -  AST 15 - 41 U/L -  ALT 14 - 54 U/L -    Discharge instruction: per After Visit Summary and "Baby and Me Booklet".  After visit meds:  Allergies as of 06/14/2017      Reactions   Hydrocodone Itching, Nausea And Vomiting      Medication List    TAKE these medications   acetaminophen 500 MG tablet Commonly known as:  TYLENOL Take 1,000 mg by mouth every 6 (six) hours as needed for moderate pain.  calcium carbonate 500 MG chewable tablet Commonly known as:  TUMS - dosed in mg elemental calcium Chew 4 tablets by mouth 3 (three) times daily as needed for indigestion or heartburn.   FLINSTONES GUMMIES OMEGA-3 DHA Chew Chew 2 each by mouth daily. Takes 2 daily   ibuprofen 600 MG tablet Commonly known as:  ADVIL,MOTRIN Take 1 tablet (600 mg total) by mouth every 6 (six) hours.   oxyCODONE 5 MG immediate release tablet Commonly known as:  Oxy IR/ROXICODONE Take 1 tablet (5 mg total) by mouth every 6 (six) hours as needed for severe pain or breakthrough pain.   senna-docusate 8.6-50 MG tablet Commonly known as:  Senokot-S Take 2 tablets by mouth at bedtime as needed for mild constipation.       Diet: routine diet  Activity: Advance as tolerated. Pelvic rest for 6 weeks.   Outpatient follow up:4 weeks Follow up Appt:Future Appointments Date Time Provider Department Center  06/21/2017 8:30 AM Tilda Burrow, MD [facility]   Follow up Visit:No Follow-up on file.  Postpartum contraception: Nexplanon  Newborn Data: Live born female  Birth Weight: 8 lb 12.4 oz (3980 g) APGAR: 9, 9  Newborn  Delivery   Birth date/time:  06/11/2017 08:10:00 Delivery type:  C-Section, Low Transverse  C-section categorization:  Repeat     Baby Feeding: Bottle Disposition:home with mother   06/14/2017 Caryl Ada, DO

## 2017-06-14 NOTE — Discharge Instructions (Signed)

## 2017-06-21 ENCOUNTER — Ambulatory Visit (INDEPENDENT_AMBULATORY_CARE_PROVIDER_SITE_OTHER): Payer: Commercial Managed Care - PPO | Admitting: Obstetrics and Gynecology

## 2017-06-21 VITALS — BP 126/58 | HR 86 | Ht 67.0 in | Wt 285.0 lb

## 2017-06-21 DIAGNOSIS — Z09 Encounter for follow-up examination after completed treatment for conditions other than malignant neoplasm: Secondary | ICD-10-CM

## 2017-06-21 DIAGNOSIS — Z9889 Other specified postprocedural states: Secondary | ICD-10-CM

## 2017-06-21 NOTE — Progress Notes (Signed)
   Subjective:  Denise Buck is a 26 y.o. female now 10 days status post repeat C section, low transverse incision. Pt says swelling of feet has gone down.    Review of Systems Negative   Diet:   normal   Bowel movements : normal.  The patient is not having any pain.  Objective:  BP (!) 126/58 (BP Location: Left Arm, Patient Position: Sitting, Cuff Size: Normal)   Pulse 86   Ht 5\' 7"  (1.702 m)   Wt 285 lb (129.3 kg)   LMP 09/09/2016 (Exact Date)   Breastfeeding? No   BMI 44.64 kg/m  General:Well developed, well nourished.  No acute distress. Abdomen: Bowel sounds normal, soft, non-tender. Pelvic Exam: not indicated   Incision(s):   Healing well, no drainage, no erythema, no hernia, no swelling, no dehiscence,     Assessment:  Post-Op 10 days status post repeat C section, low transverse incision.     Doing well postoperatively.   Plan:  1.Wound care discussed   2. . current medications: none 3. Activity restrictions: none 4. return to work: not applicable. 5. Follow up in 4 weeks. For PP check   By signing my name below, I, Izna Ahmed, attest that this documentation has been prepared under the direction and in the presence of Tilda BurrowFerguson, Davari Lopes V, MD. Electronically Signed: Redge GainerIzna Ahmed, Medical Scribe. 06/21/17. 8:54 AM.  I personally performed the services described in this documentation, which was SCRIBED in my presence. The recorded information has been reviewed and considered accurate. It has been edited as necessary during review. Tilda BurrowFERGUSON,Bruce Churilla V, MD

## 2017-06-22 NOTE — Progress Notes (Signed)
[facility]'s positive CDS for THC.  CPS will follow-up with family.  Blaine HamperAngel Boyd-Gilyard, MSW, LCSW Clinical Social Work [phone]

## 2017-07-03 ENCOUNTER — Encounter: Payer: Self-pay | Admitting: Obstetrics and Gynecology

## 2017-07-19 ENCOUNTER — Ambulatory Visit: Payer: Commercial Managed Care - PPO | Admitting: Advanced Practice Midwife

## 2017-07-26 ENCOUNTER — Ambulatory Visit (INDEPENDENT_AMBULATORY_CARE_PROVIDER_SITE_OTHER): Payer: Commercial Managed Care - PPO | Admitting: Women's Health

## 2017-07-26 ENCOUNTER — Encounter: Payer: Self-pay | Admitting: Women's Health

## 2017-07-26 DIAGNOSIS — F53 Postpartum depression: Secondary | ICD-10-CM

## 2017-07-26 DIAGNOSIS — O99345 Other mental disorders complicating the puerperium: Secondary | ICD-10-CM

## 2017-07-26 MED ORDER — ESCITALOPRAM OXALATE 10 MG PO TABS: 10.0000 mg | ORAL_TABLET | Freq: Every day | ORAL | 6 refills | Status: DC

## 2017-07-26 NOTE — Patient Instructions (Signed)
NO SEX UNTIL AFTER YOU GET YOUR BIRTH CONTROL   Postpartum Depression and Baby Blues The postpartum period begins right after the birth of a baby. During this time, there is often a great amount of joy and excitement. It is also a time of many changes in the life of the parents. Regardless of how many times a mother gives birth, each child brings new challenges and dynamics to the family. It is not unusual to have feelings of excitement along with confusing shifts in moods, emotions, and thoughts. All mothers are at risk of developing postpartum depression or the "baby blues." These mood changes can occur right after giving birth, or they may occur many months after giving birth. The baby blues or postpartum depression can be mild or severe. Additionally, postpartum depression can go away rather quickly, or it can be a long-term condition. What are the causes? Raised hormone levels and the rapid drop in those levels are thought to be a main cause of postpartum depression and the baby blues. A number of hormones change during and after pregnancy. Estrogen and progesterone usually decrease right after the delivery of your baby. The levels of thyroid hormone and various cortisol steroids also rapidly drop. Other factors that play a role in these mood changes include major life events and genetics. What increases the risk? If you have any of the following risks for the baby blues or postpartum depression, know what symptoms to watch out for during the postpartum period. Risk factors that may increase the likelihood of getting the baby blues or postpartum depression include:  Having a personal or family history of depression.  Having depression while being pregnant.  Having premenstrual mood issues or mood issues related to oral contraceptives.  Having a lot of life stress.  Having marital conflict.  Lacking a social support network.  Having a baby with special needs.  Having health problems, such  as diabetes.  What are the signs or symptoms? Symptoms of baby blues include:  Brief changes in mood, such as going from extreme happiness to sadness.  Decreased concentration.  Difficulty sleeping.  Crying spells, tearfulness.  Irritability.  Anxiety.  Symptoms of postpartum depression typically begin within the first month after giving birth. These symptoms include:  Difficulty sleeping or excessive sleepiness.  Marked weight loss.  Agitation.  Feelings of worthlessness.  Lack of interest in activity or food.  Postpartum psychosis is a very serious condition and can be dangerous. Fortunately, it is rare. Displaying any of the following symptoms is cause for immediate medical attention. Symptoms of postpartum psychosis include:  Hallucinations and delusions.  Bizarre or disorganized behavior.  Confusion or disorientation.  How is this diagnosed? A diagnosis is made by an evaluation of your symptoms. There are no medical or lab tests that lead to a diagnosis, but there are various questionnaires that a health care provider may use to identify those with the baby blues, postpartum depression, or psychosis. Often, a screening tool called the [place] Post[place] Depression Scale is used to diagnose depression in the postpartum period. How is this treated? The baby blues usually goes away on its own in 1-2 weeks. Social support is often all that is needed. You will be encouraged to get adequate sleep and rest. Occasionally, you may be given medicines to help you sleep. Postpartum depression requires treatment because it can last several months or longer if it is not treated. Treatment may include individual or group therapy, medicine, or both to address any social,  physiological, and psychological factors that may play a role in the depression. Regular exercise, a healthy diet, rest, and social support may also be strongly recommended. Postpartum psychosis is more serious and  needs treatment right away. Hospitalization is often needed. Follow these instructions at home:  Get as much rest as you can. Nap when the baby sleeps.  Exercise regularly. Some women find yoga and walking to be beneficial.  Eat a balanced and nourishing diet.  Do little things that you enjoy. Have a cup of tea, take a bubble bath, read your favorite magazine, or listen to your favorite music.  Avoid alcohol.  Ask for help with household chores, cooking, grocery shopping, or running errands as needed. Do not try to do everything.  Talk to people close to you about how you are feeling. Get support from your partner, family members, friends, or other new moms.  Try to stay positive in how you think. Think about the things you are grateful for.  Do not spend a lot of time alone.  Only take over-the-counter or prescription medicine as directed by your health care provider.  Keep all your postpartum appointments.  Let your health care provider know if you have any concerns. Contact a health care provider if: You are having a reaction to or problems with your medicine. Get help right away if:  You have suicidal feelings.  You think you may harm the baby or someone else. This information is not intended to replace advice given to you by your health care provider. Make sure you discuss any questions you have with your health care provider. Document Released: 05/11/2004 Document Revised: 01/13/2016 Document Reviewed: 05/19/2013 Elsevier Interactive Patient Education  2017 Elsevier Inc.  Etonogestrel implant What is this medicine? ETONOGESTREL (et oh noe JES trel) is a contraceptive (birth control) device. It is used to prevent pregnancy. It can be used for up to 3 years. This medicine may be used for other purposes; ask your health care provider or pharmacist if you have questions. COMMON BRAND NAME(S): Implanon, Nexplanon What should I tell my health care provider before I take  this medicine? They need to know if you have any of these conditions: -abnormal vaginal bleeding -blood vessel disease or blood clots -cancer of the breast, cervix, or liver -depression -diabetes -gallbladder disease -headaches -heart disease or recent heart attack -high blood pressure -high cholesterol -kidney disease -liver disease -renal disease -seizures -tobacco smoker -an unusual or allergic reaction to etonogestrel, other hormones, anesthetics or antiseptics, medicines, foods, dyes, or preservatives -pregnant or trying to get pregnant -breast-feeding How should I use this medicine? This device is inserted just under the skin on the inner side of your upper arm by a health care professional. Talk to your pediatrician regarding the use of this medicine in children. Special care may be needed. Overdosage: If you think you have taken too much of this medicine contact a poison control center or emergency room at once. NOTE: This medicine is only for you. Do not share this medicine with others. What if I miss a dose? This does not apply. What may interact with this medicine? Do not take this medicine with any of the following medications: -amprenavir -bosentan -fosamprenavir This medicine may also interact with the following medications: -barbiturate medicines for inducing sleep or treating seizures -certain medicines for fungal infections like ketoconazole and itraconazole -grapefruit juice -griseofulvin -medicines to treat seizures like carbamazepine, felbamate, oxcarbazepine, phenytoin, topiramate -modafinil -phenylbutazone -rifampin -rufinamide -some medicines to treat HIV  infection like atazanavir, indinavir, lopinavir, nelfinavir, tipranavir, ritonavir -St. John's wort This list may not describe all possible interactions. Give your health care provider a list of all the medicines, herbs, non-prescription drugs, or dietary supplements you use. Also tell them if you  smoke, drink alcohol, or use illegal drugs. Some items may interact with your medicine. What should I watch for while using this medicine? This product does not protect you against HIV infection (AIDS) or other sexually transmitted diseases. You should be able to feel the implant by pressing your fingertips over the skin where it was inserted. Contact your doctor if you cannot feel the implant, and use a non-hormonal birth control method (such as condoms) until your doctor confirms that the implant is in place. If you feel that the implant may have broken or become bent while in your arm, contact your healthcare provider. What side effects may I notice from receiving this medicine? Side effects that you should report to your doctor or health care professional as soon as possible: -allergic reactions like skin rash, itching or hives, swelling of the face, lips, or tongue -breast lumps -changes in emotions or moods -depressed mood -heavy or prolonged menstrual bleeding -pain, irritation, swelling, or bruising at the insertion site -scar at site of insertion -signs of infection at the insertion site such as fever, and skin redness, pain or discharge -signs of pregnancy -signs and symptoms of a blood clot such as breathing problems; changes in vision; chest pain; severe, sudden headache; pain, swelling, warmth in the leg; trouble speaking; sudden numbness or weakness of the face, arm or leg -signs and symptoms of liver injury like dark yellow or brown urine; general ill feeling or flu-like symptoms; light-colored stools; loss of appetite; nausea; right upper belly pain; unusually weak or tired; yellowing of the eyes or skin -unusual vaginal bleeding, discharge -signs and symptoms of a stroke like changes in vision; confusion; trouble speaking or understanding; severe headaches; sudden numbness or weakness of the face, arm or leg; trouble walking; dizziness; loss of balance or coordination Side effects  that usually do not require medical attention (report to your doctor or health care professional if they continue or are bothersome): -acne -back pain -breast pain -changes in weight -dizziness -general ill feeling or flu-like symptoms -headache -irregular menstrual bleeding -nausea -sore throat -vaginal irritation or inflammation This list may not describe all possible side effects. Call your doctor for medical advice about side effects. You may report side effects to FDA at 1-800-FDA-1088. Where should I keep my medicine? This drug is given in a hospital or clinic and will not be stored at home. NOTE: This sheet is a summary. It may not cover all possible information. If you have questions about this medicine, talk to your doctor, pharmacist, or health care provider.  2018 Elsevier/Gold Standard (2016-02-24 11:19:22)

## 2017-07-26 NOTE — Progress Notes (Signed)
   POSTPARTUM VISIT Patient name: Denise MayerMichaela Buck MRN [id number]  Date of birth: Dec 07, 1990 Chief Complaint:   postpartum visit (interested in Nexplanon)  History of Present Illness:   Denise Buck is a 26 y.o. 653P2101 Caucasian female being seen today for a postpartum visit. She is 6 weeks postpartum following a repeat cesarean section, low transverse incision at 39 gestational weeks. Anesthesia: spinal. I have fully reviewed the prenatal and intrapartum course. Pregnancy uncomplicated. Postpartum course has been uncomplicated. Bleeding on period. Bowel function is normal. Bladder function is normal.  Patient is sexually active. Last sexual activity: 11/30.  Contraception method is wants nexplanon.  [place] Postpartum Depression Screening: positive. Score 16.  Eating/sleeping ok, still finds joy in things she used to. Denies SI/HI/II. No dx h/o dep/anx, but states she has always felt depressed. Feels depression is getting better than what it was the first few weeks after delivery- states she would just cry all the time. Does feel that she wants to try medicine. Declines counseling.  Last pap 2016.  Results were normal .  Patient's last menstrual period was 07/25/2017.  Baby's course has been uncomplicated. Baby is feeding by bottle.  Review of Systems:   Pertinent items are noted in HPI Denies Abnormal vaginal discharge w/ itching/odor/irritation, headaches, visual changes, shortness of breath, chest pain, abdominal pain, severe nausea/vomiting, or problems with urination or bowel movements. Pertinent History Reviewed:  Reviewed past medical,surgical, obstetrical and family history.  Reviewed problem list, medications and allergies. OB History  Gravida Para Term Preterm AB Living  3 3 2 1  0 1  SAB TAB Ectopic Multiple Live Births  0 0 0 0 2    # Outcome Date GA Lbr Len/2nd Weight Sex Delivery Anes PTL Lv  3 Term 06/11/17 6140w2d  8 lb 12.4 oz (3.98 kg) M CS-LTranv Spinal  LIV  2  Preterm 01/14/15 2977w0d    VBAC EPI Y ND  1 Term 06/27/11 3077w0d  8 lb 2 oz (3.685 kg) M CS-LTranv EPI N LIV     Complications: Failure to Progress in First Stage     Physical Assessment:   Vitals:   07/26/17 1021  BP: 132/70  Pulse: 78  Weight: 281 lb 3.2 oz (127.6 kg)  Height: 5\' 7"  (1.702 m)  Body mass index is 44.04 kg/m.       Physical Examination:   General appearance: alert, well appearing, and in no distress  Mental status: alert, oriented to person, place, and time  Skin: warm & dry   Cardiovascular: normal heart rate noted   Respiratory: normal respiratory effort, no distress   Breasts: deferred, no complaints   Abdomen: soft, non-tender, c/s incision well-healed   Pelvic: exam declined by the patient- on period w/ tampon in  Rectal: No hemorrhoids  Extremities: No edema       No results found for this or any previous visit (from the past 24 hour(s)).  Assessment & Plan:  1) Postpartum exam 2) 6 wks s/p RCS 3) Bottlefeeding 4) Depression screening 5) Contraception counseling, pt prefers Nexplanon, abstinence until insertion 6) PPD> rx Lexapro 10mg  daily, understands it can take a few weeks to notice improvement  Follow-up: Return for 12/14 for nexplanon insertion (has umr/mcd), then 4wks from now for pap & physical and f/u on meds.   No orders of the defined types were placed in this encounter.   Marge DuncansBooker, Larosa Rhines Randall CNM, [facility]BC 07/26/2017 10:59 AM

## 2017-08-03 ENCOUNTER — Encounter: Payer: Self-pay | Admitting: Women's Health

## 2017-08-03 ENCOUNTER — Ambulatory Visit (INDEPENDENT_AMBULATORY_CARE_PROVIDER_SITE_OTHER): Payer: Commercial Managed Care - PPO | Admitting: Women's Health

## 2017-08-03 VITALS — BP 118/60 | HR 77 | Ht 67.0 in | Wt 287.0 lb

## 2017-08-03 DIAGNOSIS — Z30017 Encounter for initial prescription of implantable subdermal contraceptive: Secondary | ICD-10-CM

## 2017-08-03 DIAGNOSIS — Z3046 Encounter for surveillance of implantable subdermal contraceptive: Secondary | ICD-10-CM | POA: Diagnosis not present

## 2017-08-03 DIAGNOSIS — Z3202 Encounter for pregnancy test, result negative: Secondary | ICD-10-CM | POA: Diagnosis not present

## 2017-08-03 LAB — POCT URINE PREGNANCY: PREG TEST UR: NEGATIVE

## 2017-08-03 MED ORDER — ETONOGESTREL 68 MG ~~LOC~~ IMPL
68.0000 mg | DRUG_IMPLANT | Freq: Once | SUBCUTANEOUS | Status: AC
  Administered 2017-08-03: 68 mg via SUBCUTANEOUS

## 2017-08-03 NOTE — Patient Instructions (Signed)

## 2017-08-03 NOTE — Progress Notes (Signed)
   NEXPLANON INSERTION Patient name: Denise Buck MRN [id number]  Date of birth: [birth year] Subjective Findings:   Denise Buck is a 26 y.o. 693P2101 Caucasian female being seen today for insertion of a Nexplanon.   Patient's last menstrual period was 07/25/2017. Last sexual intercourse was 07/20/17 Last pap2016. Results were:  normal  Risks/benefits/side effects of Nexplanon have been discussed and her questions have been answered.  Specifically, a failure rate of 08/998 has been reported, with an increased failure rate if pt takes St. John's Wort and/or antiseizure medicaitons.  She is aware of the common side effect of irregular bleeding, which the incidence of decreases over time. Signed copy of informed consent in chart.  Pertinent History Reviewed:   Reviewed past medical,surgical, social, obstetrical and family history.  Reviewed problem list, medications and allergies. Objective Findings & Procedure:    Vitals:   08/03/17 1007  BP: 118/60  Pulse: 77  Weight: 287 lb (130.2 kg)  Height: 5\' 7"  (1.702 m)  Body mass index is 44.95 kg/m.  Results for orders placed or performed in visit on 08/03/17 (from the past 24 hour(s))  POCT urine pregnancy   Collection Time: 08/03/17 10:10 AM  Result Value Ref Range   Preg Test, Ur Negative Negative     Time out was performed.  She is right-handed, so her left arm, approximately 4 inches proximal from the elbow, was cleansed with alcohol and anesthetized with 2cc of 2% Lidocaine.  The area was cleansed again with betadine and the Nexplanon was inserted per manufacturer's recommendations without difficulty.  3 steri-strips and pressure bandage were applied. The patient tolerated the procedure well.  Assessment & Plan:   1) Nexplanon insertion Pt was instructed to keep the area clean and dry, remove pressure bandage in 24 hours, and keep insertion site covered with the steri-strip for 3-5 days.  Back up contraception was recommended  for 2 weeks.  She was given a card indicating date Nexplanon was inserted and date it needs to be removed. Follow-up PRN problems.  Orders Placed This Encounter  Procedures  . POCT urine pregnancy    Follow-up: Return for 1/3 as scheduled for , Pap & physical.  Marge DuncansBooker, Alberto Pina Randall CNM, [facility] 08/03/2017 10:45 AM

## 2017-08-03 NOTE — Addendum Note (Signed)
Addended by: Colen DarlingYOUNG,  S on: 08/03/2017 10:52 AM   Modules accepted: Orders

## 2017-08-23 ENCOUNTER — Other Ambulatory Visit: Payer: Commercial Managed Care - PPO | Admitting: Women's Health

## 2017-09-06 ENCOUNTER — Other Ambulatory Visit: Payer: Commercial Managed Care - PPO | Admitting: Women's Health

## 2017-09-17 ENCOUNTER — Other Ambulatory Visit (HOSPITAL_COMMUNITY)
Admission: RE | Admit: 2017-09-17 | Discharge: 2017-09-17 | Disposition: A | Discharge status: Home / Self Care | Payer: Commercial Managed Care - PPO | Source: Ambulatory Visit | Attending: Dermatology | Admitting: Dermatology

## 2017-09-17 DIAGNOSIS — Z79899 Other long term (current) drug therapy: Secondary | ICD-10-CM | POA: Diagnosis present

## 2017-09-18 LAB — HIV ANTIBODY (ROUTINE TESTING W REFLEX): HIV SCREEN 4TH GENERATION: NONREACTIVE

## 2017-09-18 LAB — HEPATITIS C ANTIBODY: HCV Ab: 0.1 s/co ratio (ref 0.0–0.9)

## 2017-09-18 LAB — HEPATITIS B SURFACE ANTIGEN: Hepatitis B Surface Ag: NEGATIVE

## 2017-10-04 ENCOUNTER — Encounter: Payer: Self-pay | Admitting: Women's Health

## 2017-10-04 ENCOUNTER — Ambulatory Visit (INDEPENDENT_AMBULATORY_CARE_PROVIDER_SITE_OTHER): Payer: Commercial Managed Care - PPO | Admitting: Women's Health

## 2017-10-04 ENCOUNTER — Other Ambulatory Visit (HOSPITAL_COMMUNITY)
Admission: RE | Admit: 2017-10-04 | Discharge: 2017-10-04 | Disposition: A | Discharge status: Home / Self Care | Payer: Commercial Managed Care - PPO | Source: Ambulatory Visit | Attending: Obstetrics & Gynecology | Admitting: Obstetrics & Gynecology

## 2017-10-04 VITALS — BP 128/60 | HR 75 | Ht 67.0 in | Wt 291.0 lb

## 2017-10-04 DIAGNOSIS — Z124 Encounter for screening for malignant neoplasm of cervix: Secondary | ICD-10-CM

## 2017-10-04 DIAGNOSIS — Z803 Family history of malignant neoplasm of breast: Secondary | ICD-10-CM | POA: Diagnosis not present

## 2017-10-04 DIAGNOSIS — Z01411 Encounter for gynecological examination (general) (routine) with abnormal findings: Secondary | ICD-10-CM

## 2017-10-04 DIAGNOSIS — Z01419 Encounter for gynecological examination (general) (routine) without abnormal findings: Secondary | ICD-10-CM

## 2017-10-04 NOTE — Progress Notes (Signed)
WELL-WOMAN EXAMINATION Patient name: Denise MayerMichaela Buck MRN [id number]  Date of birth: 1991-08-07 Chief Complaint:   Gynecologic Exam (hasn't been taking Lexapro)  History of Present Illness:   Denise Buck is a 27 y.o. 283P2101 Caucasian female being seen today for a routine well-woman exam. Baby is 4mths old, bottlefeeding. Went to dermatologist, had 3 moles removed-had to go back to have more removed from one on abd.  Current complaints: none, stopped taking Lexapro rx'd 07/26/17 for PPD, states she feels much better.  [facility] 10/04/2017 11/20/2016  Decreased Interest 0 0  Down, Depressed, Hopeless 0 0  PHQ - 2 Score 0 0  Altered sleeping - 0  Tired, decreased energy - 0  Change in appetite - 3  Feeling bad or failure about yourself  - 0  Trouble concentrating - 0  Moving slowly or fidgety/restless - 0  Suicidal thoughts - 0  PHQ-9 Score - 3    PCP: none      does not desire labs, other than pap, gc/ct Patient's last menstrual period was 09/26/2017 (approximate). The current method of family planning is nexplanon placed 08/03/17 Last pap 2016. Results were: normal Last mammogram: never. Results were: n/a, reports paternal grandmother and aunt dx w/ breast CA, uncertain about genetic testing- will check w/ them Last colonoscopy: never. Results were: n/a  Review of Systems:   Pertinent items are noted in HPI Denies any headaches, blurred vision, fatigue, shortness of breath, chest pain, abdominal pain, abnormal vaginal discharge/itching/odor/irritation, problems with periods, bowel movements, urination, or intercourse unless otherwise stated above. Pertinent History Reviewed:  Reviewed past medical,surgical, social and family history.  Reviewed problem list, medications and allergies. Physical Assessment:   Vitals:   10/04/17 0842  BP: 128/60  Pulse: 75  Weight: 291 lb (132 kg)  Height: 5\' 7"  (1.702 m)  Body mass index is 45.58 kg/m.        Physical  Examination:   General appearance - well appearing, and in no distress  Mental status - alert, oriented to person, place, and time  Psych:  She has a normal mood and affect  Skin - warm and dry, normal color, no suspicious lesions noted, 2 healing areas on back from mole removal, one on Lt abdomen  Chest - effort normal, all lung fields clear to auscultation bilaterally  Heart - normal rate and regular rhythm  Neck:  midline trachea, no thyromegaly or nodules  Breasts - breasts appear normal, no suspicious masses, no skin or nipple changes or  axillary nodes  Abdomen - soft, nontender, nondistended, no masses or organomegaly  Pelvic - VULVA: normal appearing vulva with no masses, tenderness or lesions  VAGINA: normal appearing vagina with normal color and discharge, no lesions  CERVIX: normal appearing cervix without discharge or lesions, no CMT  Thin prep pap is done w/ reflex HR HPV cotesting  UTERUS: uterus is felt to be normal size, shape, consistency and nontender   ADNEXA: No adnexal masses or tenderness noted.  Extremities:  No swelling or varicosities noted  No results found for this or any previous visit (from the past 24 hour(s)).  Assessment & Plan:  1) Well-Woman Exam  2) Family h/o breast cancer> paternal grandmother and aunt, pt to ask if they have had genetic testing, let us know if she's interested in meeting w/ genetic counselor  Labs/procedures today: pap, gc/ct  Mammogram @26yo  or sooner if problems Colonoscopy @26yo  or sooner if problems  No orders of the defined  types were placed in this encounter.   Follow-up: Return in about 1 year (around 10/04/2018) for Physical.  Cheral Marker CNM, [facility] 10/04/2017 9:29 AM

## 2017-10-05 LAB — CYTOLOGY - PAP
CHLAMYDIA, DNA PROBE: NEGATIVE
DIAGNOSIS: NEGATIVE
NEISSERIA GONORRHEA: NEGATIVE

## 2017-12-18 ENCOUNTER — Ambulatory Visit (INDEPENDENT_AMBULATORY_CARE_PROVIDER_SITE_OTHER): Payer: BC Managed Care – PPO | Admitting: Women's Health

## 2017-12-18 ENCOUNTER — Encounter: Payer: Self-pay | Admitting: Women's Health

## 2017-12-18 ENCOUNTER — Other Ambulatory Visit: Payer: Self-pay

## 2017-12-18 VITALS — BP 122/80 | HR 83 | Ht 67.0 in | Wt 290.0 lb

## 2017-12-18 DIAGNOSIS — N898 Other specified noninflammatory disorders of vagina: Secondary | ICD-10-CM

## 2017-12-18 DIAGNOSIS — Z978 Presence of other specified devices: Secondary | ICD-10-CM

## 2017-12-18 DIAGNOSIS — Z975 Presence of (intrauterine) contraceptive device: Principal | ICD-10-CM

## 2017-12-18 DIAGNOSIS — N921 Excessive and frequent menstruation with irregular cycle: Secondary | ICD-10-CM | POA: Diagnosis not present

## 2017-12-18 NOTE — Progress Notes (Signed)
   GYN VISIT Patient name: Denise Buck MRN [id number]  Date of birth: 1991/07/15 Chief Complaint:   Contraception (discuss changing BC)  History of Present Illness:   Denise Buck is a 27 y.o. G36P2101 Caucasian female being seen today for report of brown d/c x 1 month on Nexplanon. Had placed in Dec, no bleeding until now. Some odor, no itching/irritation.      No LMP recorded. Patient has had an implant. The current method of family planning is nexplanon . Last pap 10/04/17. Results were:  normal Review of Systems:   Pertinent items are noted in HPI Denies fever/chills, dizziness, headaches, visual disturbances, fatigue, shortness of breath, chest pain, abdominal pain, vomiting, abnormal vaginal discharge/itching/odor/irritation, problems with periods, bowel movements, urination, or intercourse unless otherwise stated above.  Pertinent History Reviewed:  Reviewed past medical,surgical, social, obstetrical and family history.  Reviewed problem list, medications and allergies. Physical Assessment:   Vitals:   12/18/17 1044  BP: 122/80  Pulse: 83  Weight: 290 lb (131.5 kg)  Height:  (1.702 m)  Body mass index is 45.42 kg/m.       Physical Examination:   General appearance: alert, well appearing, and in no distress  Mental status: alert, oriented to person, place, and time  Skin: warm & dry   Cardiovascular: normal heart rate noted  Respiratory: normal respiratory effort, no distress  Abdomen: soft, non-tender   Pelvic: VULVA: normal appearing vulva with no masses, tenderness or lesions  Extremities: no edema   No results found for this or any previous visit (from the past 24 hour(s)).  Assessment & Plan:  1) Breakthrough bleeding on nexplanon> nuswab sent, if neg will try megace. Will call her w/ results  Meds: No orders of the defined types were placed in this encounter.   Orders Placed This Encounter  Procedures  . NuSwab Vaginitis Plus (VG+)    No  follow-ups on file.  Cheral Marker CNM, [facility] 12/18/2017 11:24 AM

## 2017-12-26 LAB — NUSWAB VAGINITIS PLUS (VG+)
CANDIDA ALBICANS, NAA: NEGATIVE
Candida glabrata, NAA: NEGATIVE
Chlamydia trachomatis, NAA: NEGATIVE
Neisseria gonorrhoeae, NAA: NEGATIVE
Trich vag by NAA: NEGATIVE

## 2018-01-03 ENCOUNTER — Telehealth: Payer: Self-pay | Admitting: *Deleted

## 2018-01-03 NOTE — Telephone Encounter (Signed)
PT RETURNED YOUR CALL/ IS OUT OF WORK NOW AND SHOULD BE ABLE TO ANSWER WHEN YOU CALL BACK

## 2018-01-03 NOTE — Telephone Encounter (Signed)
Called pt but unable to leave VM. Box full.

## 2018-01-03 NOTE — Telephone Encounter (Addendum)
Called patient but mailbox full. If she calls back, ask her to check [employer] from [place].

## 2018-01-07 ENCOUNTER — Encounter: Payer: Self-pay | Admitting: Women's Health

## 2018-01-17 ENCOUNTER — Ambulatory Visit (INDEPENDENT_AMBULATORY_CARE_PROVIDER_SITE_OTHER): Payer: BC Managed Care – PPO | Admitting: Women's Health

## 2018-01-17 ENCOUNTER — Encounter: Payer: Self-pay | Admitting: Women's Health

## 2018-01-17 VITALS — BP 128/78 | HR 77 | Ht 67.0 in | Wt 287.0 lb

## 2018-01-17 DIAGNOSIS — Z3046 Encounter for surveillance of implantable subdermal contraceptive: Secondary | ICD-10-CM

## 2018-01-17 DIAGNOSIS — Z30011 Encounter for initial prescription of contraceptive pills: Secondary | ICD-10-CM

## 2018-01-17 DIAGNOSIS — Z3049 Encounter for surveillance of other contraceptives: Secondary | ICD-10-CM

## 2018-01-17 MED ORDER — LO LOESTRIN FE 1 MG-10 MCG / 10 MCG PO TABS: 1.0000 | ORAL_TABLET | Freq: Every day | ORAL | 3 refills | Status: DC

## 2018-01-17 NOTE — Patient Instructions (Signed)
Keep the area clean and dry.  You can remove the big bandage in 24 hours, and the small steri-strip bandage in 3-5 days.  A back up method, such as condoms, should be used for two weeks.   Oral Contraception Use Oral contraceptive pills (OCPs) are medicines taken to prevent pregnancy. OCPs work by preventing the ovaries from releasing eggs. The hormones in OCPs also cause the cervical mucus to thicken, preventing the sperm from entering the uterus. The hormones also cause the uterine lining to become thin, not allowing a fertilized egg to attach to the inside of the uterus. OCPs are highly effective when taken exactly as prescribed. However, OCPs do not prevent sexually transmitted diseases (STDs). Safe sex practices, such as using condoms along with an OCP, can help prevent STDs. Before taking OCPs, you may have a physical exam and Pap test. Your health care provider may also order blood tests if necessary. Your health care provider will make sure you are a good candidate for oral contraception. Discuss with your health care provider the possible side effects of the OCP you may be prescribed. When starting an OCP, it can take 2 to 3 months for the body to adjust to the changes in hormone levels in your body. How to take oral contraceptive pills Your health care provider may advise you on how to start taking the first cycle of OCPs. Otherwise, you can:  Start on day 1 of your menstrual period. You will not need any backup contraceptive protection with this start time.  Start on the first Sunday after your menstrual period or the day you get your prescription. In these cases, you will need to use backup contraceptive protection for the first week.  Start the pill at any time of your cycle. If you take the pill within 5 days of the start of your period, you are protected against pregnancy right away. In this case, you will not need a backup form of birth control. If you start at any other time of your  menstrual cycle, you will need to use another form of birth control for 7 days. If your OCP is the type called a minipill, it will protect you from pregnancy after taking it for 2 days (48 hours).  After you have started taking OCPs:  If you forget to take 1 pill, take it as soon as you remember. Take the next pill at the regular time.  If you miss 2 or more pills, call your health care provider because different pills have different instructions for missed doses. Use backup birth control until your next menstrual period starts.  If you use a 28-day pack that contains inactive pills and you miss 1 of the last 7 pills (pills with no hormones), it will not matter. Throw away the rest of the non-hormone pills and start a new pill pack.  No matter which day you start the OCP, you will always start a new pack on that same day of the week. Have an extra pack of OCPs and a backup contraceptive method available in case you miss some pills or lose your OCP pack. Follow these instructions at home:  Do not smoke.  Always use a condom to protect against STDs. OCPs do not protect against STDs.  Use a calendar to mark your menstrual period days.  Read the information and directions that came with your OCP. Talk to your health care provider if you have questions. Contact a health care provider if:  You  vomiting.  You have abnormal vaginal discharge or bleeding.  You develop a rash.  You miss your menstrual period.  You are losing your hair.  You need treatment for mood swings or depression.  You get dizzy when taking the OCP.  You develop acne from taking the OCP.  You become pregnant. Get help right away if:  You develop chest pain.  You develop shortness of breath.  You have an uncontrolled or severe headache.  You develop numbness or slurred speech.  You develop visual problems.  You develop pain, redness, and swelling in the legs. This information is not  intended to replace advice given to you by your health care provider. Make sure you discuss any questions you have with your health care provider. Document Released: 07/27/2011 Document Revised: 01/13/2016 Document Reviewed: 01/26/2013 Elsevier Interactive Patient Education  2017 [employer]  

## 2018-01-17 NOTE — Progress Notes (Signed)
   NEXPLANON REMOVAL Patient name: Denise Buck MRN [id number]  Date of birth: May 24, 1991 Subjective Findings:   Denise Buck is a 27 y.o. G51P2101 Caucasian female being seen today for removal of a Nexplanon. Her Nexplanon was placed 08/03/17.  She desires removal because of continue spotting. Signed copy of informed consent in chart.   No LMP recorded. Patient has had an implant. Last pap2/14/19. Results were:  normal The planned method of family planning is OCP (estrogen/progesterone). Does not smoke, no h/o HTN, DVT/PE, CVA, MI, or migraines w/ aura.  Pertinent History Reviewed:   Reviewed past medical,surgical, social, obstetrical and family history.  Reviewed problem list, medications and allergies. Objective Findings & Procedure:    Vitals:   01/17/18 1600  BP: 128/78  Pulse: 77  Weight: 287 lb (130.2 kg)  Height:  (1.702 m)  Body mass index is 44.95 kg/m.  No results found for this or any previous visit (from the past 24 hour(s)).   Time out was performed.  Nexplanon site identified.  Area prepped in usual sterile fashon. One cc of 2% lidocaine was used to anesthetize the area at the distal end of the implant. A small stab incision was made right beside the implant on the distal portion.  The Nexplanon rod was grasped using hemostats and removed without difficulty.  There was less than 3 cc blood loss. There were no complications.  Steri-strips were applied over the small incision and a pressure bandage was applied.  The patient tolerated the procedure well. Assessment & Plan:   1) Nexplanon removal She was instructed to keep the area clean and dry, remove pressure bandage in 24 hours, and keep insertion site covered with the steri-strip for 3-5 days.   Follow-up PRN problems.  2) Contraception management> rx LoLoestrin, condoms x 2wks  No orders of the defined types were placed in this encounter.   Follow-up: Return in about 3 months (around 04/19/2018) for  F/U.  Cheral Marker CNM, [facility] 01/17/2018 4:29 PM

## 2018-04-19 ENCOUNTER — Ambulatory Visit: Payer: BC Managed Care – PPO | Admitting: Women's Health

## 2018-09-22 IMAGING — US US OB LIMITED
1 series · 14 of 28 positions shown · non-contrast
Comparison: none

CLINICAL DATA: Pregnant patient in second trimester pregnancy with
abdominal cramping.

EXAM:
LIMITED OBSTETRIC ULTRASOUND

[Series 1: us ob limited · 0.23mm/px · 14 of 34 slices shown]
[im 2/34]
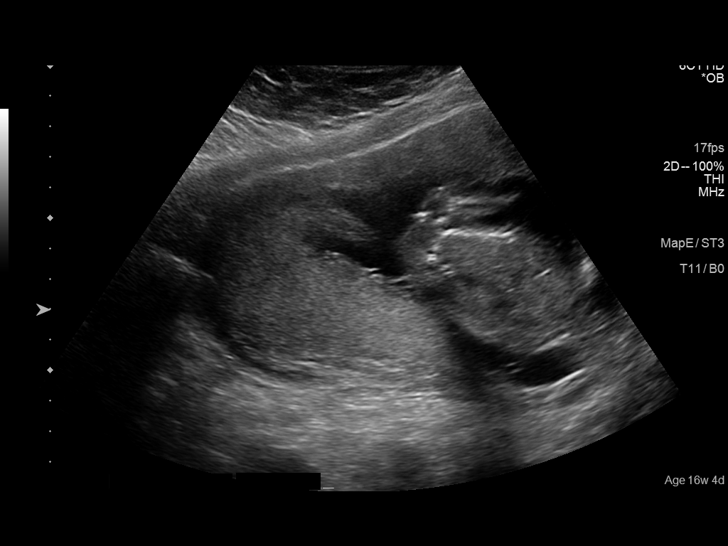
[im 4/34]
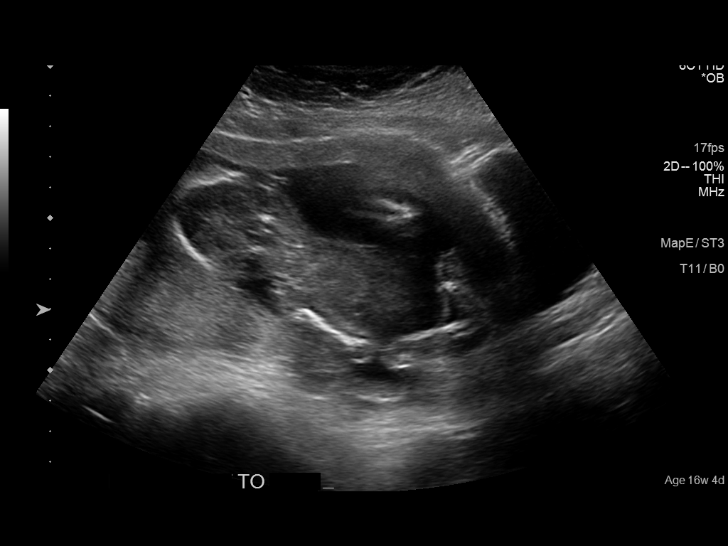
[im 7/34]
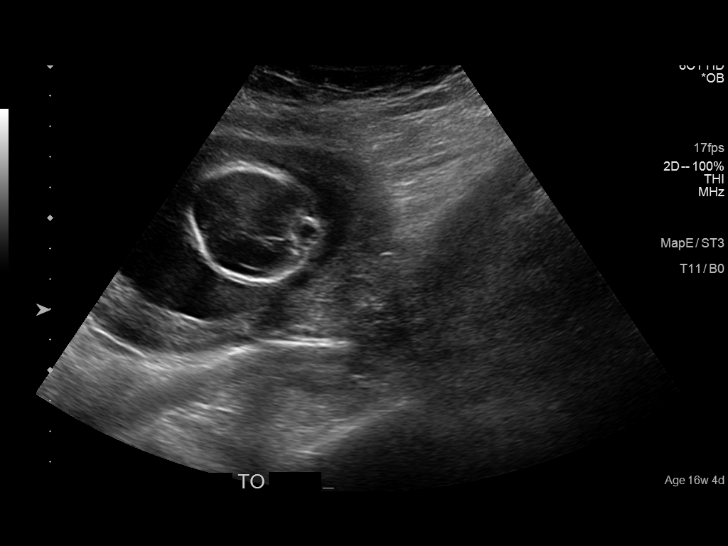
[im 9/34]
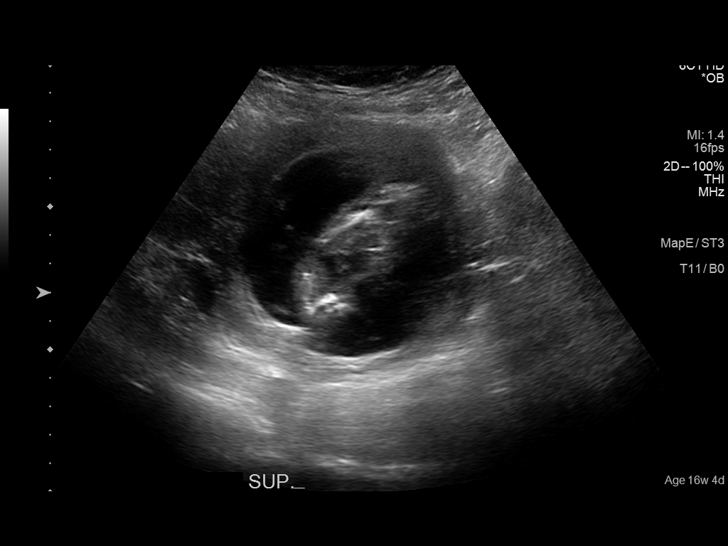
[im 12/34]
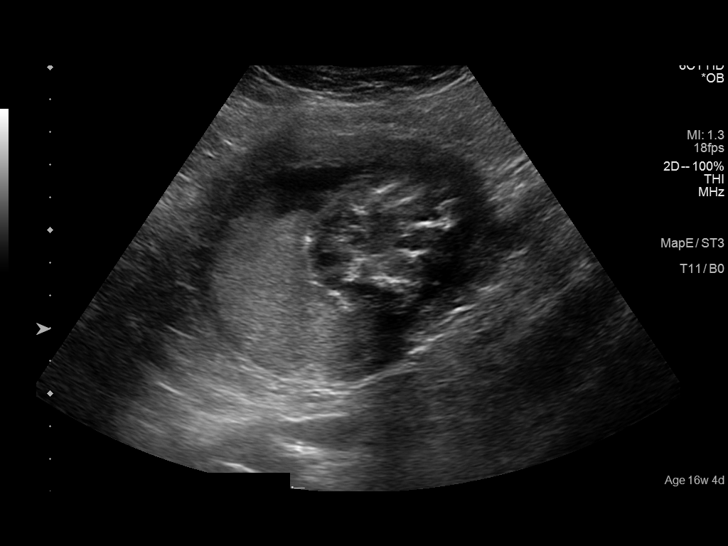
[im 14/34]
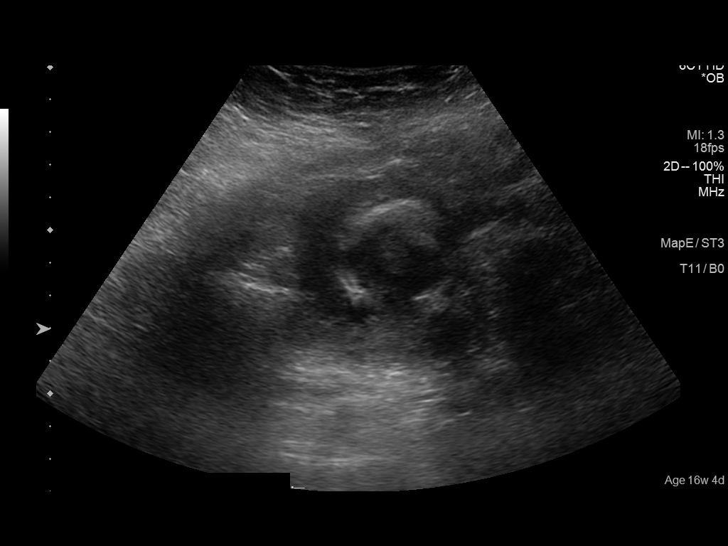
[im 16/34]
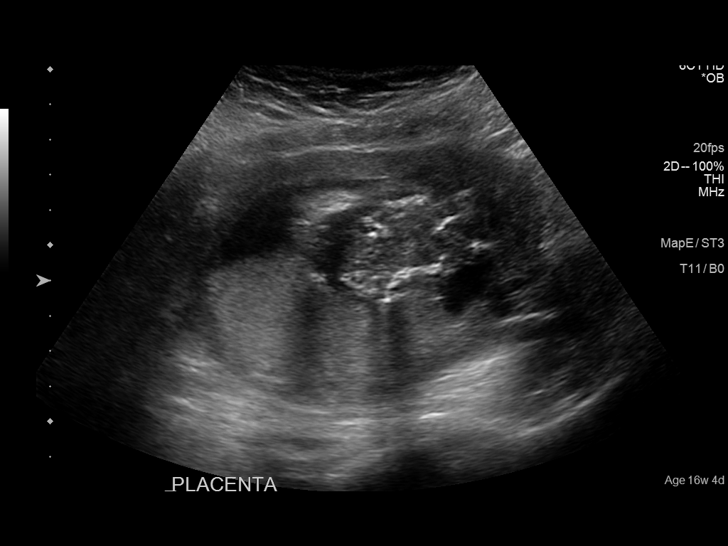
[im 19/34]
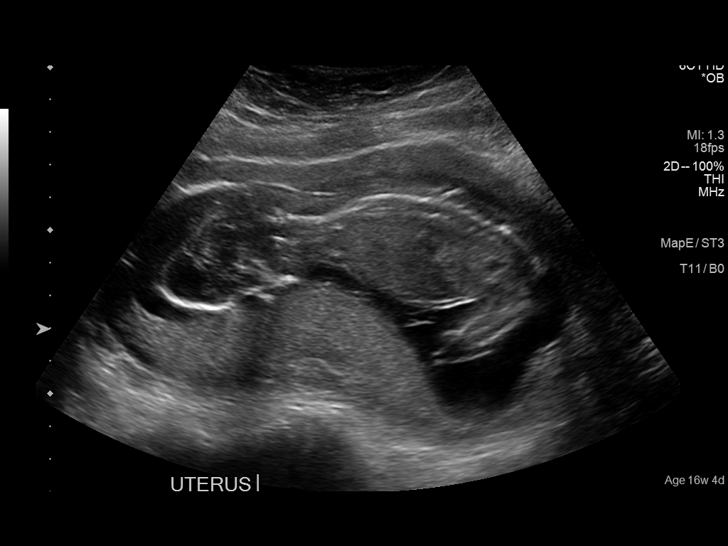
[im 21/34]
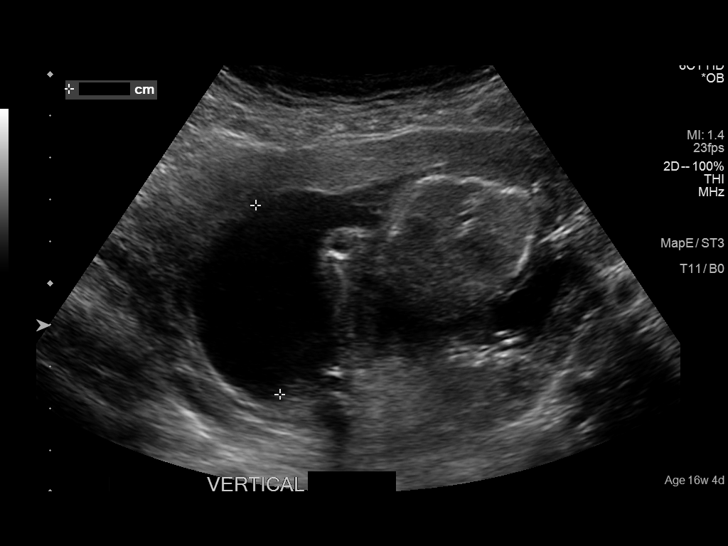
[im 24/34]
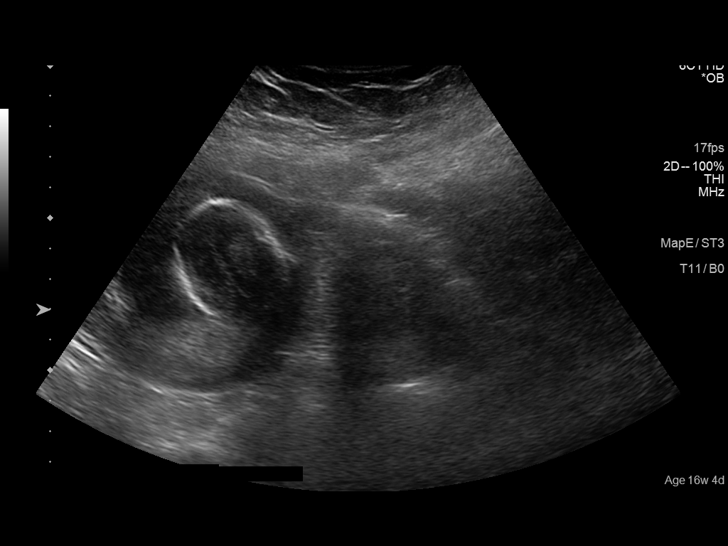
[im 26/34]
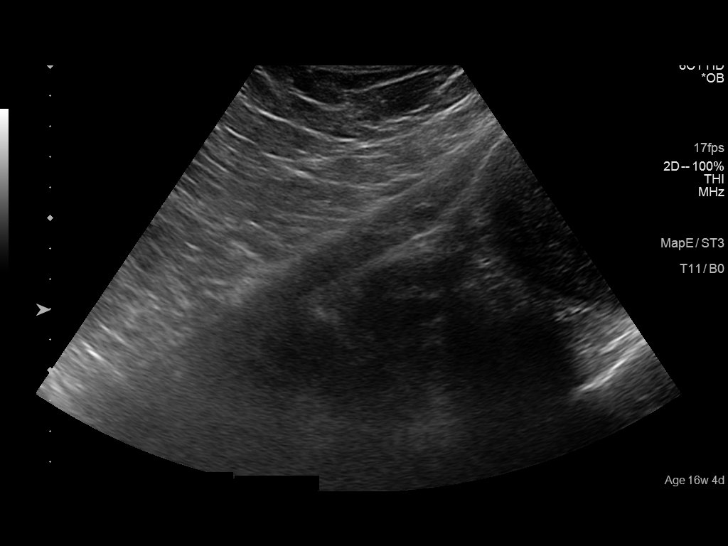
[im 29/34]
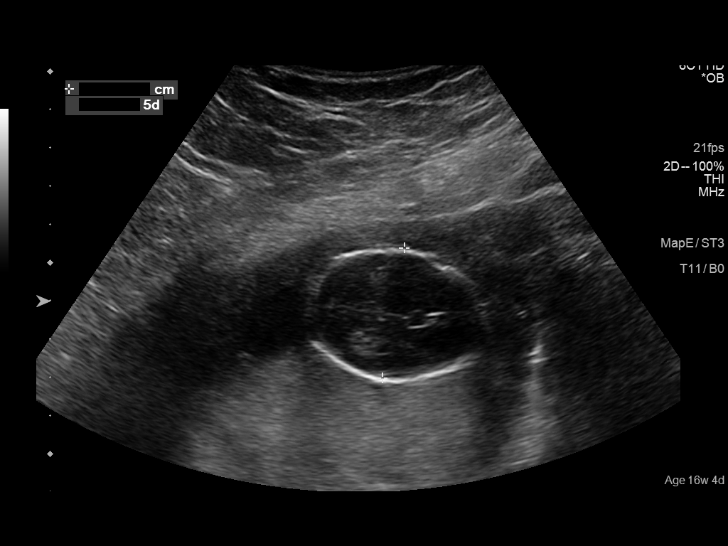
[im 31/34]
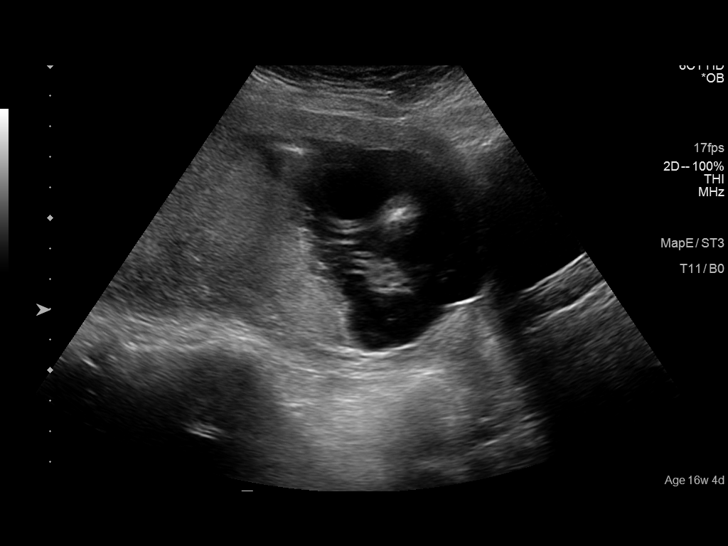
[im 34/34]
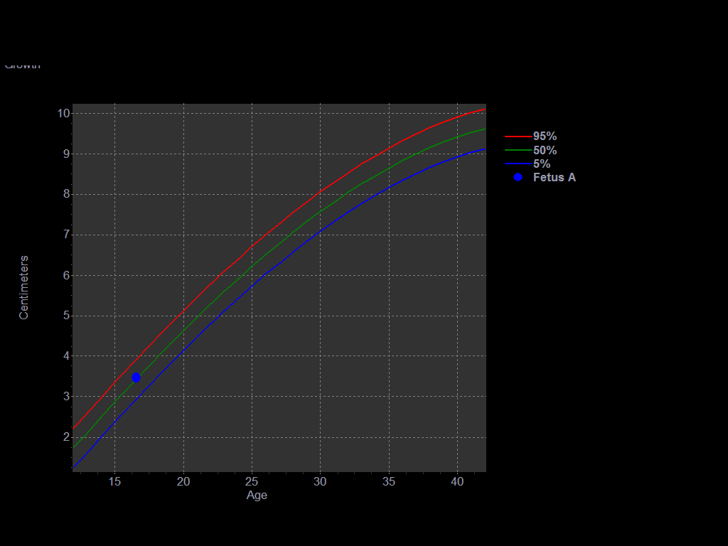

[14 of 28 positions shown; findings below may reference images not displayed]

FINDINGS: Number of Fetuses: 1

Heart Rate:  137 bpm

Movement: Yes

Presentation: Breech

Placental Location: Posterior

Previa: No

Amniotic Fluid (Subjective):  Within normal limits.

BPD:  3.47cm 16w  5d

MATERNAL FINDINGS:

Cervix:  Appears closed.

Uterus/Adnexae: No abnormality visualized.
IMPRESSION: Single live intrauterine pregnancy gestational age 16 weeks 5 days.
No complications.

This exam is performed on an emergent basis and does not
comprehensively evaluate fetal size, dating, or anatomy; follow-up
complete OB US should be considered if further fetal assessment is
warranted.

## 2018-11-15 ENCOUNTER — Inpatient Hospital Stay (HOSPITAL_COMMUNITY)
Admission: EM | Admit: 2018-11-15 | Discharge: 2018-11-17 | DRG: 770 | Disposition: A | Discharge status: Home / Self Care | Payer: Medicaid Other | Attending: Obstetrics & Gynecology | Admitting: Obstetrics & Gynecology

## 2018-11-15 ENCOUNTER — Other Ambulatory Visit: Payer: Self-pay

## 2018-11-15 ENCOUNTER — Encounter (HOSPITAL_COMMUNITY): Payer: Self-pay | Admitting: Emergency Medicine

## 2018-11-15 ENCOUNTER — Inpatient Hospital Stay (HOSPITAL_COMMUNITY): Payer: Medicaid Other

## 2018-11-15 DIAGNOSIS — D649 Anemia, unspecified: Secondary | ICD-10-CM | POA: Diagnosis not present

## 2018-11-15 DIAGNOSIS — Z803 Family history of malignant neoplasm of breast: Secondary | ICD-10-CM | POA: Diagnosis not present

## 2018-11-15 DIAGNOSIS — R0902 Hypoxemia: Secondary | ICD-10-CM | POA: Diagnosis not present

## 2018-11-15 DIAGNOSIS — R58 Hemorrhage, not elsewhere classified: Secondary | ICD-10-CM | POA: Diagnosis not present

## 2018-11-15 DIAGNOSIS — Z9049 Acquired absence of other specified parts of digestive tract: Secondary | ICD-10-CM | POA: Diagnosis not present

## 2018-11-15 DIAGNOSIS — R404 Transient alteration of awareness: Secondary | ICD-10-CM | POA: Diagnosis not present

## 2018-11-15 DIAGNOSIS — O034 Incomplete spontaneous abortion without complication: Principal | ICD-10-CM | POA: Diagnosis present

## 2018-11-15 DIAGNOSIS — N939 Abnormal uterine and vaginal bleeding, unspecified: Secondary | ICD-10-CM | POA: Diagnosis present

## 2018-11-15 DIAGNOSIS — N719 Inflammatory disease of uterus, unspecified: Secondary | ICD-10-CM | POA: Diagnosis not present

## 2018-11-15 DIAGNOSIS — Z87891 Personal history of nicotine dependence: Secondary | ICD-10-CM | POA: Diagnosis not present

## 2018-11-15 DIAGNOSIS — O070 Genital tract and pelvic infection following failed attempted termination of pregnancy: Secondary | ICD-10-CM | POA: Diagnosis present

## 2018-11-15 DIAGNOSIS — O0387 Sepsis following complete or unspecified spontaneous abortion: Secondary | ICD-10-CM | POA: Diagnosis not present

## 2018-11-15 DIAGNOSIS — N83201 Unspecified ovarian cyst, right side: Secondary | ICD-10-CM | POA: Diagnosis not present

## 2018-11-15 DIAGNOSIS — R42 Dizziness and giddiness: Secondary | ICD-10-CM | POA: Diagnosis not present

## 2018-11-15 DIAGNOSIS — R55 Syncope and collapse: Secondary | ICD-10-CM | POA: Diagnosis not present

## 2018-11-15 DIAGNOSIS — O071 Delayed or excessive hemorrhage following failed attempted termination of pregnancy: Secondary | ICD-10-CM | POA: Diagnosis not present

## 2018-11-15 DIAGNOSIS — D62 Acute posthemorrhagic anemia: Secondary | ICD-10-CM | POA: Diagnosis present

## 2018-11-15 HISTORY — DX: Other specified health status: Z78.9

## 2018-11-15 LAB — CBC WITH DIFFERENTIAL/PLATELET
ABS IMMATURE GRANULOCYTES: 0.17 10*3/uL — AB (ref 0.00–0.07)
Basophils Absolute: 0 10*3/uL (ref 0.0–0.1)
Basophils Relative: 0 %
EOS PCT: 0 %
Eosinophils Absolute: 0 10*3/uL (ref 0.0–0.5)
HCT: 23.8 % — ABNORMAL LOW (ref 36.0–46.0)
HEMOGLOBIN: 7.4 g/dL — AB (ref 12.0–15.0)
Immature Granulocytes: 1 %
LYMPHS ABS: 1.9 10*3/uL (ref 0.7–4.0)
LYMPHS PCT: 13 %
MCH: 28.5 pg (ref 26.0–34.0)
MCHC: 31.1 g/dL (ref 30.0–36.0)
MCV: 91.5 fL (ref 80.0–100.0)
Monocytes Absolute: 0.5 10*3/uL (ref 0.1–1.0)
Monocytes Relative: 3 %
Neutro Abs: 12.4 10*3/uL — ABNORMAL HIGH (ref 1.7–7.7)
Neutrophils Relative %: 83 %
Platelets: 234 10*3/uL (ref 150–400)
RBC: 2.6 MIL/uL — AB (ref 3.87–5.11)
RDW: 13.9 % (ref 11.5–15.5)
WBC: 15 10*3/uL — AB (ref 4.0–10.5)
nRBC: 0 % (ref 0.0–0.2)

## 2018-11-15 LAB — BASIC METABOLIC PANEL
Anion gap: 8 (ref 5–15)
BUN: 19 mg/dL (ref 6–20)
CHLORIDE: 110 mmol/L (ref 98–111)
CO2: 20 mmol/L — ABNORMAL LOW (ref 22–32)
CREATININE: 1.02 mg/dL — AB (ref 0.44–1.00)
Calcium: 8.2 mg/dL — ABNORMAL LOW (ref 8.9–10.3)
GFR calc Af Amer: >60 mL/min (ref >60)
GFR calc non Af Amer: >60 mL/min (ref >60)
GLUCOSE: 139 mg/dL — AB (ref 70–99)
Potassium: 3.8 mmol/L (ref 3.5–5.1)
SODIUM: 138 mmol/L (ref 135–145)

## 2018-11-15 LAB — HCG, SERUM, QUALITATIVE: PREG SERUM: POSITIVE — AB

## 2018-11-15 LAB — PREPARE RBC (CROSSMATCH)

## 2018-11-15 LAB — ABO/RH: ABO/RH(D): O POS

## 2018-11-15 MED ORDER — MISOPROSTOL 100 MCG PO TABS
800.0000 ug | ORAL_TABLET | Freq: Once | ORAL | Status: AC
  Administered 2018-11-15: 800 ug via ORAL

## 2018-11-15 MED ORDER — PIPERACILLIN-TAZOBACTAM 3.375 G IVPB
3.3750 g | Freq: Once | INTRAVENOUS | Status: AC
  Administered 2018-11-15: 3.375 g via INTRAVENOUS
  Filled 2018-11-15: qty 50

## 2018-11-15 MED ORDER — SODIUM CHLORIDE 0.9 % IV SOLN: 10.0000 mL/h | Freq: Once | INTRAVENOUS | Status: DC

## 2018-11-15 MED ORDER — MISOPROSTOL 100 MCG PO TABS
ORAL_TABLET | ORAL | Status: AC
  Filled 2018-11-15: qty 6

## 2018-11-15 NOTE — H&P (Addendum)
Preoperative History and Physical  Denise Buck is a 28 y.o. G3P2101 with Patient's last menstrual period was 10/20/2018. admitted for a anemia as a result of heavy vaginal bleeding due to what was thought to be and now confirmed to be incomplete legal abortion with retained POC and infection after an elective termination 1 month ago in [place].  On zosyn Sonogram confimrs suspected diagnosis  PMH:    Past Medical History:  Diagnosis Date  . Chlamydia   . Medical history non-contributory     PSH:     Past Surgical History:  Procedure Laterality Date  . CESAREAN SECTION    . CESAREAN SECTION N/A 06/11/2017   Procedure: REPEAT CESAREAN SECTION;  Surgeon: Tilda Burrow, MD;  Location: [facility] BIRTHING SUITES;  Service: Obstetrics;  Laterality: N/A;  . CHOLECYSTECTOMY    . WISDOM TOOTH EXTRACTION      POb/GynH:      OB History    Gravida  3   Para  3   Term  2   Preterm  1   AB  0   Living  1     SAB  0   TAB  0   Ectopic  0   Multiple  0   Live Births  2           SH:   Social History   Tobacco Use  . Smoking status: Former Smoker    Types: Cigarettes  . Smokeless tobacco: Never Used  Substance Use Topics  . Alcohol use: No    Comment: occ; not now  . Drug use: No    FH:    Family History  Problem Relation Age of Onset  . Autism Maternal Uncle   . Cancer Paternal Aunt        breast cancer  . Cancer Paternal Grandmother        breast cancer     Allergies:  Allergies  Allergen Reactions  . Hydrocodone Itching and Nausea And Vomiting    Medications:       Current Facility-Administered Medications:  .  0.9 %  sodium chloride infusion, 10 mL/hr, Intravenous, Once, Geoffery Lyons, MD  Review of Systems:   Review of Systems  Constitutional: Negative for fever, chills, weight loss, malaise/fatigue and diaphoresis.  HENT: Negative for hearing loss, ear pain, nosebleeds, congestion, sore throat, neck pain, tinnitus and ear  discharge.   Eyes: Negative for blurred vision, double vision, photophobia, pain, discharge and redness.  Respiratory: Negative for cough, hemoptysis, sputum production, shortness of breath, wheezing and stridor.   Cardiovascular: Negative for chest pain, palpitations, orthopnea, claudication, leg swelling and PND.  Gastrointestinal: Positive for abdominal pain. Negative for heartburn, nausea, vomiting, diarrhea, constipation, blood in stool and melena.  Genitourinary: Negative for dysuria, urgency, frequency, hematuria and flank pain.  Musculoskeletal: Negative for myalgias, back pain, joint pain and falls.  Skin: Negative for itching and rash.  Neurological: Negative for dizziness, tingling, tremors, sensory change, speech change, focal weakness, seizures, loss of consciousness, weakness and headaches.  Endo/Heme/Allergies: Negative for environmental allergies and polydipsia. Does not bruise/bleed easily.  Psychiatric/Behavioral: Negative for depression, suicidal ideas, hallucinations, memory loss and substance abuse. The patient is not nervous/anxious and does not have insomnia.      PHYSICAL EXAM:  Blood pressure (!) 115/54, pulse 87, temperature 99.1 F (37.3 C), temperature source Oral, resp. rate 20, height  (1.727 m), weight 133.4 kg, last menstrual period 10/20/2018, SpO2 100 %, not currently breastfeeding.  Vitals reviewed. Constitutional: She is oriented to person, place, and time. She appears well-developed and well-nourished.  HENT:  Head: Normocephalic and atraumatic.  Right Ear: External ear normal.  Left Ear: External ear normal.  Nose: Nose normal.  Mouth/Throat: Oropharynx is clear and moist.  Eyes: Conjunctivae and EOM are normal. Pupils are equal, round, and reactive to light. Right eye exhibits no discharge. Left eye exhibits no discharge. No scleral icterus.  Neck: Normal range of motion. Neck supple. No tracheal deviation present. No thyromegaly present.   Cardiovascular: Normal rate, regular rhythm, normal heart sounds and intact distal pulses.  Exam reveals no gallop and no friction rub.   No murmur heard. Respiratory: Effort normal and breath sounds normal. No respiratory distress. She has no wheezes. She has no rales. She exhibits no tenderness.  GI: Soft. Bowel sounds are normal. She exhibits no distension and no mass. There is tenderness. There is no rebound and no guarding.  Genitourinary:       Vulva is normal without lesions Vagina is pink moist without discharge Cervix normal in appearance and pap is normal Uterus is normal size, contour, position, consistency, mobility, non-tender Adnexa is negative with normal sized ovaries by sonogram  Musculoskeletal: Normal range of motion. She exhibits no edema and no tenderness.  Neurological: She is alert and oriented to person, place, and time. She has normal reflexes. She displays normal reflexes. No cranial nerve deficit. She exhibits normal muscle tone. Coordination normal.  Skin: Skin is warm and dry. No rash noted. No erythema. No pallor.  Psychiatric: She has a normal mood and affect. Her behavior is normal. Judgment and thought content normal.    Labs: Results for orders placed or performed during the hospital encounter of 11/15/18 (from the past 336 hour(s))  Basic metabolic panel   Collection Time: 11/15/18  1:01 AM  Result Value Ref Range   Sodium 138 135 - 145 mmol/L   Potassium 3.8 3.5 - 5.1 mmol/L   Chloride 110 98 - 111 mmol/L   CO2 20 (L) 22 - 32 mmol/L   Glucose, Bld 139 (H) 70 - 99 mg/dL   BUN 19 6 - 20 mg/dL   Creatinine, Ser 4.19 (H) 0.44 - 1.00 mg/dL   Calcium 8.2 (L) 8.9 - 10.3 mg/dL   GFR calc non Af Amer >60 >60 mL/min   GFR calc Af Amer >60 >60 mL/min   Anion gap 8 5 - 15  CBC with Differential   Collection Time: 11/15/18  1:01 AM  Result Value Ref Range   WBC 15.0 (H) 4.0 - 10.5 K/uL   RBC 2.60 (L) 3.87 - 5.11 MIL/uL   Hemoglobin 7.4 (L) 12.0 - 15.0  g/dL   HCT 62.2 (L) 29.7 - 98.9 %   MCV 91.5 80.0 - 100.0 fL   MCH 28.5 26.0 - 34.0 pg   MCHC 31.1 30.0 - 36.0 g/dL   RDW 21.1 94.1 - 74.0 %   Platelets 234 150 - 400 K/uL   nRBC 0.0 0.0 - 0.2 %   Neutrophils Relative % 83 %   Neutro Abs 12.4 (H) 1.7 - 7.7 K/uL   Lymphocytes Relative 13 %   Lymphs Abs 1.9 0.7 - 4.0 K/uL   Monocytes Relative 3 %   Monocytes Absolute 0.5 0.1 - 1.0 K/uL   Eosinophils Relative 0 %   Eosinophils Absolute 0.0 0.0 - 0.5 K/uL   Basophils Relative 0 %   Basophils Absolute 0.0 0.0 - 0.1 K/uL  Immature Granulocytes 1 %   Abs Immature Granulocytes 0.17 (H) 0.00 - 0.07 K/uL  hCG, serum, qualitative   Collection Time: 11/15/18  1:01 AM  Result Value Ref Range   Preg, Serum POSITIVE (A) NEGATIVE  Type and screen   Collection Time: 11/15/18  1:02 AM  Result Value Ref Range   ABO/RH(D) O POS    Antibody Screen NEG    Sample Expiration 11/18/2018    Unit Number [id number]    Blood Component Type RED CELLS,LR    Unit division 00    Status of Unit ISSUED    Transfusion Status OK TO TRANSFUSE    Crossmatch Result Compatible    Unit Number [id number]    Blood Component Type RBC LR PHER2    Unit division 00    Status of Unit ISSUED    Transfusion Status OK TO TRANSFUSE    Crossmatch Result      Compatible Performed at [facility], [address], [place], [place] [address]   BPAM [facility]   Collection Time: 11/15/18  1:02 AM  Result Value Ref Range   ISSUE DATE / TIME [id number]    Blood Product Unit Number [id number]    PRODUCT CODE G6440H47    Unit Type and Rh 5100    Blood Product Expiration Date [id number]    ISSUE DATE / TIME [id number]    Blood Product Unit Number [id number]    PRODUCT CODE F5732K02    Unit Type and Rh 5100    Blood Product Expiration Date [id number]   Prepare RBC   Collection Time: 11/15/18  1:35 AM  Result Value Ref Range   Order Confirmation      ORDER PROCESSED BY BLOOD BANK Performed at [facility], [address] [place], [place] [address]   ABO/Rh   Collection Time: 11/15/18  1:35 AM  Result Value Ref Range   ABO/RH(D)      [facility] [place] POS Performed at [facility], [address], [place], [place] [address]     EKG: Orders placed or performed during the hospital encounter of 11/15/18  . EKG 12-Lead  . EKG 12-Lead  . EKG    Imaging Studies: US Pelvis Transvanginal Non-ob (tv Only)  Result Date: 11/15/2018 CLINICAL DATA:  Vaginal bleeding EXAM: TRANSABDOMINAL AND TRANSVAGINAL ULTRASOUND OF PELVIS TECHNIQUE: Both transabdominal and transvaginal ultrasound examinations of the pelvis were performed. Transabdominal technique was performed for global imaging of the pelvis including uterus, ovaries, adnexal regions, and pelvic cul-de-sac. It was necessary to proceed with endovaginal exam following the transabdominal exam to visualize the endometrium and ovaries. COMPARISON:  None FINDINGS: Uterus Measurements: 11.6 x 5.8 x 5.9 cm = volume: 207 mL. No fibroids or other mass visualized. Endometrium Thickness: 4.4 mm. Complex echogenic material in the lower uterine segment measuring 3.1 x 2.9 x 1.9 cm with internal Doppler flow concerning for retained products of conception versus molar pregnancy. Cystic area without internal Doppler flow measuring 1.2 x 1.4 x 1.2 cm may reflect a hematoma versus submucosal fibroid. Right ovary Measurements: 3.9 x 2.1 x 2.3 cm = volume: 9.8 mL. 1.3 x 1.3 x 1.4 cm cystic area with peripheral flow likely reflecting a corpus luteum cyst. Left ovary Measurements: 2.7 x 1.6 x 1.7 = volume: 4 mL. Normal appearance/no adnexal mass. Other findings No abnormal free fluid. IMPRESSION: 1. Complex echogenic material in the lower uterine segment measuring 3.1 x 2.9 x 1.9 cm with internal Doppler flow concerning for retained products of conception versus molar  pregnancy. Electronically Signed   By: Elige Ko   On: 11/15/2018 12:44   US Pelvis Complete  Result Date:  11/15/2018 CLINICAL DATA:  Vaginal bleeding EXAM: TRANSABDOMINAL AND TRANSVAGINAL ULTRASOUND OF PELVIS TECHNIQUE: Both transabdominal and transvaginal ultrasound examinations of the pelvis were performed. Transabdominal technique was performed for global imaging of the pelvis including uterus, ovaries, adnexal regions, and pelvic cul-de-sac. It was necessary to proceed with endovaginal exam following the transabdominal exam to visualize the endometrium and ovaries. COMPARISON:  None FINDINGS: Uterus Measurements: 11.6 x 5.8 x 5.9 cm = volume: 207 mL. No fibroids or other mass visualized. Endometrium Thickness: 4.4 mm. Complex echogenic material in the lower uterine segment measuring 3.1 x 2.9 x 1.9 cm with internal Doppler flow concerning for retained products of conception versus molar pregnancy. Cystic area without internal Doppler flow measuring 1.2 x 1.4 x 1.2 cm may reflect a hematoma versus submucosal fibroid. Right ovary Measurements: 3.9 x 2.1 x 2.3 cm = volume: 9.8 mL. 1.3 x 1.3 x 1.4 cm cystic area with peripheral flow likely reflecting a corpus luteum cyst. Left ovary Measurements: 2.7 x 1.6 x 1.7 = volume: 4 mL. Normal appearance/no adnexal mass. Other findings No abnormal free fluid. IMPRESSION: 1. Complex echogenic material in the lower uterine segment measuring 3.1 x 2.9 x 1.9 cm with internal Doppler flow concerning for retained products of conception versus molar pregnancy. Electronically Signed   By: Elige Ko   On: 11/15/2018 12:44      Assessment: Incomplete legal abortion with uterine infection Vaginal bleeding requiring transfusion due to same  Plan: >24 hours of pre op antibiotics with zosyn >Suction D&C 3/28 >24 hours of IV zosyn post D&C as well >hopefully discharge 3/29  For 10 more days of oral antibiotics  Lazaro Arms 11/15/2018 1:13 PM

## 2018-11-15 NOTE — ED Triage Notes (Addendum)
Pt here by [facility] from home stating she had an abortion in Feb, pt c/o heavy vaginal bleeding x 40 mins ago, pt reports she passed out in the bath tub prior to calling EMS, upon EMS arrival BP 80/62 and pt dizzy and wobbly when standing, per EMS pt passed out a couple of times in the stair chair trying to get into EMS truck, boyfriend stated decreased appetite x 2 days, all other v/s WNL, NS en route, pt reports she was [redacted] weeks pregnant when she had the abortion, pt reports she has had continual bleeding since the abortion worsening tonight, pt reports c/o same x a few days ago but did not pass out just became dizzy

## 2018-11-15 NOTE — ED Provider Notes (Signed)
[facility] EMERGENCY DEPARTMENT Provider Note   CSN: [id number] Arrival date & time: 11/15/18  0023    History   Chief Complaint Chief Complaint  Patient presents with  . Vaginal Bleeding    HPI Denise Buck is a 28 y.o. female.     Patient is a 28 year old female with history of recent elective abortion.  This was performed last month.  Since the procedure, the patient has been having intermittent bleeding.  This worsened significantly this evening.  She had passed a significant amount of blood in the shower and became near syncopal.  She was found to have a borderline low blood pressure by EMS and was transported here.  Patient denies fevers or chills.  The history is provided by the patient.  Vaginal Bleeding  Quality:  Bright red Severity:  Moderate Onset quality:  Gradual Duration:  1 month Timing:  Constant Progression:  Worsening Chronicity:  New Relieved by:  Nothing Worsened by:  Nothing Ineffective treatments:  None tried   Past Medical History:  Diagnosis Date  . Chlamydia     Patient Active Problem List   Diagnosis Date Noted  . Family history of breast cancer 10/04/2017  . Postpartum depression 07/26/2017  . Trichomonas infection 01/08/2017  . Marijuana use 11/21/2016  . History of vaginal delivery following previous cesarean delivery 11/20/2016  . History of preterm delivery 11/20/2016  . Previous cesarean section 11/20/2016    Past Surgical History:  Procedure Laterality Date  . CESAREAN SECTION    . CESAREAN SECTION N/A 06/11/2017   Procedure: REPEAT CESAREAN SECTION;  Surgeon: Tilda Burrow, MD;  Location: [facility] BIRTHING SUITES;  Service: Obstetrics;  Laterality: N/A;  . CHOLECYSTECTOMY    . WISDOM TOOTH EXTRACTION       OB History    Gravida  3   Para  3   Term  2   Preterm  1   AB  0   Living  1     SAB  0   TAB  0   Ectopic  0   Multiple  0   Live Births  2            Home Medications    Prior to  Admission medications   Medication Sig Start Date End Date Taking? Authorizing Provider  acetaminophen (TYLENOL) 500 MG tablet Take 1,000 mg by mouth every 6 (six) hours as needed for moderate pain.    [provider]  etonogestrel (NEXPLANON) 68 MG IMPL implant 1 each by Subdermal route once.    [provider]  LO LOESTRIN FE 1 MG-10 MCG / 10 MCG tablet Take 1 tablet by mouth daily. 01/17/18   Cheral Marker, CNM    Family History Family History  Problem Relation Age of Onset  . Autism Maternal Uncle   . Cancer Paternal Aunt        breast cancer  . Cancer Paternal Grandmother        breast cancer    Social History Social History   Tobacco Use  . Smoking status: Former Smoker    Types: Cigarettes  . Smokeless tobacco: Never Used  Substance Use Topics  . Alcohol use: No    Comment: occ; not now  . Drug use: No     Allergies   Hydrocodone   Review of Systems Review of Systems  Genitourinary: Positive for vaginal bleeding.  All other systems reviewed and are negative.    Physical Exam Updated Vital Signs  BP (!) 83/45 (BP Location: Right Arm)   Pulse 85   Temp 98 F (36.7 C) (Oral)   Resp 10   Ht 5\' 8"  (1.727 m)   Wt 113.4 kg   LMP 10/20/2018   SpO2 100%   BMI 38.01 kg/m   Physical Exam Vitals signs and nursing note reviewed.  Constitutional:      General: She is not in acute distress.    Appearance: She is well-developed. She is not diaphoretic.  HENT:     Head: Normocephalic and atraumatic.  Neck:     Musculoskeletal: Normal range of motion and neck supple.  Cardiovascular:     Rate and Rhythm: Normal rate and regular rhythm.     Heart sounds: No murmur. No friction rub. No gallop.   Pulmonary:     Effort: Pulmonary effort is normal. No respiratory distress.     Breath sounds: Normal breath sounds. No wheezing.  Abdominal:     General: Bowel sounds are normal. There is no distension.     Palpations: Abdomen is soft.      Tenderness: There is no abdominal tenderness.  Musculoskeletal: Normal range of motion.  Skin:    General: Skin is warm and dry.     Coloration: Skin is pale.  Neurological:     Mental Status: She is alert and oriented to person, place, and time.      ED Treatments / Results  Labs (all labs ordered are listed, but only abnormal results are displayed) Labs Reviewed  CBC WITH DIFFERENTIAL/PLATELET - Abnormal; Notable for the following components:      Result Value   WBC 15.0 (*)    RBC 2.60 (*)    Hemoglobin 7.4 (*)    HCT 23.8 (*)    Neutro Abs 12.4 (*)    Abs Immature Granulocytes 0.17 (*)    All other components within normal limits  BASIC METABOLIC PANEL  HCG, SERUM, QUALITATIVE  TYPE AND SCREEN    EKG EKG Interpretation  Date/Time:  Friday November 15 2018 00:33:04 EDT Ventricular Rate:  85 PR Interval:    QRS Duration: 91 QT Interval:  373 QTC Calculation: 444 R Axis:   51 Text Interpretation:  Sinus rhythm Normal ECG Confirmed by Geoffery Lyons ([id number]) on 11/15/2018 1:12:34 AM   Radiology No results found.  Procedures Procedures (including critical care time)  Medications Ordered in ED Medications - No data to display   Initial Impression / Assessment and Plan / ED Course  I have reviewed the triage vital signs and the nursing notes.  Pertinent labs & imaging results that were available during my care of the patient were reviewed by me and considered in my medical decision making (see chart for details).  Patient presenting here with complaints of vaginal bleeding for the past month following an elective abortion at [facility] in [place].  She apparently had a syncopal episode this evening while in the shower accompanied with worsening bleeding.  She was found by EMS to be hypotensive with blood pressure in the 80s systolic.  She was given IV fluids in route.  She was initially hypotensive upon arrival, however blood pressure improved with  additional fluids.  Patient's work-up shows a white count of 15,000, hemoglobin of 7.2, both concerning for retained products of conception.  The care was discussed with Dr. Despina Hidden from GYN who is recommending admission.  Patient will be given 2 units of packed red cells and will undergo ultrasound in the morning.  She was also given Cytotec and Zosyn per Dr. Despina Hidden.  CRITICAL CARE Performed by: Geoffery Lyons Total critical care time: 45 minutes Critical care time was exclusive of separately billable procedures and treating other patients. Critical care was necessary to treat or prevent imminent or life-threatening deterioration. Critical care was time spent personally by me on the following activities: development of treatment plan with patient and/or surrogate as well as nursing, discussions with consultants, evaluation of patient's response to treatment, examination of patient, obtaining history from patient or surrogate, ordering and performing treatments and interventions, ordering and review of laboratory studies, ordering and review of radiographic studies, pulse oximetry and re-evaluation of patient's condition.   Final Clinical Impressions(s) / ED Diagnoses   Final diagnoses:  None    ED Discharge Orders    None       Geoffery Lyons, MD 11/15/18 [phone]

## 2018-11-16 ENCOUNTER — Inpatient Hospital Stay (HOSPITAL_COMMUNITY): Payer: Medicaid Other | Admitting: Anesthesiology

## 2018-11-16 ENCOUNTER — Encounter (HOSPITAL_COMMUNITY)
Admission: EM | Disposition: A | Discharge status: Home / Self Care | Payer: Self-pay | Source: Home / Self Care | Attending: Obstetrics & Gynecology

## 2018-11-16 DIAGNOSIS — O071 Delayed or excessive hemorrhage following failed attempted termination of pregnancy: Secondary | ICD-10-CM

## 2018-11-16 HISTORY — PX: DILATION AND CURETTAGE OF UTERUS: SHX78

## 2018-11-16 LAB — TYPE AND SCREEN
ABO/RH(D): O POS
ANTIBODY SCREEN: NEGATIVE
UNIT DIVISION: 0
Unit division: 0

## 2018-11-16 LAB — BPAM RBC
BLOOD PRODUCT EXPIRATION DATE: 202004132359
Blood Product Expiration Date: 202004122359
ISSUE DATE / TIME: 202003270453
ISSUE DATE / TIME: 202003271309
UNIT TYPE AND RH: 5100
UNIT TYPE AND RH: 5100

## 2018-11-16 LAB — CBC
HCT: 26.1 % — ABNORMAL LOW (ref 36.0–46.0)
Hemoglobin: 8.3 g/dL — ABNORMAL LOW (ref 12.0–15.0)
MCH: 28.1 pg (ref 26.0–34.0)
MCHC: 31.8 g/dL (ref 30.0–36.0)
MCV: 88.5 fL (ref 80.0–100.0)
Platelets: 194 10*3/uL (ref 150–400)
RBC: 2.95 MIL/uL — ABNORMAL LOW (ref 3.87–5.11)
RDW: 15.2 % (ref 11.5–15.5)
WBC: 7.8 10*3/uL (ref 4.0–10.5)
nRBC: 0 % (ref 0.0–0.2)

## 2018-11-16 LAB — SURGICAL PCR SCREEN
MRSA, PCR: NEGATIVE
Staphylococcus aureus: NEGATIVE

## 2018-11-16 SURGERY — DILATION AND CURETTAGE
Anesthesia: General

## 2018-11-16 MED ORDER — METHYLERGONOVINE MALEATE 0.2 MG PO TABS
0.2000 mg | ORAL_TABLET | ORAL | Status: AC
  Administered 2018-11-16 – 2018-11-17 (×6): 0.2 mg via ORAL
  Filled 2018-11-16 (×6): qty 1

## 2018-11-16 MED ORDER — PROPOFOL 10 MG/ML IV BOLUS
INTRAVENOUS | Status: DC | PRN
  Administered 2018-11-16: 200 mg via INTRAVENOUS

## 2018-11-16 MED ORDER — FENTANYL CITRATE (PF) 250 MCG/5ML IJ SOLN
INTRAMUSCULAR | Status: AC
  Filled 2018-11-16: qty 5

## 2018-11-16 MED ORDER — ONDANSETRON HCL 4 MG/2ML IJ SOLN
INTRAMUSCULAR | Status: AC
  Filled 2018-11-16: qty 2

## 2018-11-16 MED ORDER — KCL IN DEXTROSE-NACL 20-5-0.45 MEQ/L-%-% IV SOLN
INTRAVENOUS | Status: DC
  Administered 2018-11-16 – 2018-11-17 (×2): via INTRAVENOUS

## 2018-11-16 MED ORDER — LACTATED RINGERS IV SOLN
INTRAVENOUS | Status: DC
  Administered 2018-11-16: 08:00:00 via INTRAVENOUS

## 2018-11-16 MED ORDER — OXYCODONE-ACETAMINOPHEN 5-325 MG PO TABS: 1.0000 | ORAL_TABLET | ORAL | Status: DC | PRN

## 2018-11-16 MED ORDER — HYDROMORPHONE HCL 1 MG/ML IJ SOLN: 0.2500 mg | INTRAMUSCULAR | Status: DC | PRN

## 2018-11-16 MED ORDER — METHYLERGONOVINE MALEATE 0.2 MG/ML IJ SOLN
INTRAMUSCULAR | Status: AC
  Filled 2018-11-16: qty 1

## 2018-11-16 MED ORDER — METHYLERGONOVINE MALEATE 0.2 MG/ML IJ SOLN
INTRAMUSCULAR | Status: DC | PRN
  Administered 2018-11-16: 0.2 mg via INTRAMUSCULAR

## 2018-11-16 MED ORDER — PIPERACILLIN-TAZOBACTAM 3.375 G IVPB
3.3750 g | Freq: Three times a day (TID) | INTRAVENOUS | Status: DC
  Administered 2018-11-16 – 2018-11-17 (×4): 3.375 g via INTRAVENOUS
  Filled 2018-11-16 (×4): qty 50

## 2018-11-16 MED ORDER — KETOROLAC TROMETHAMINE 30 MG/ML IJ SOLN
30.0000 mg | Freq: Four times a day (QID) | INTRAMUSCULAR | Status: DC
  Administered 2018-11-16 (×2): 30 mg via INTRAVENOUS
  Filled 2018-11-16 (×2): qty 1

## 2018-11-16 MED ORDER — HYDROCODONE-ACETAMINOPHEN 7.5-325 MG PO TABS: 1.0000 | ORAL_TABLET | Freq: Once | ORAL | Status: DC | PRN

## 2018-11-16 MED ORDER — KETOROLAC TROMETHAMINE 30 MG/ML IJ SOLN: 30.0000 mg | Freq: Four times a day (QID) | INTRAMUSCULAR | Status: DC

## 2018-11-16 MED ORDER — KETOROLAC TROMETHAMINE 30 MG/ML IJ SOLN
INTRAMUSCULAR | Status: AC
  Filled 2018-11-16: qty 1

## 2018-11-16 MED ORDER — FENTANYL CITRATE (PF) 100 MCG/2ML IJ SOLN
INTRAMUSCULAR | Status: DC | PRN
  Administered 2018-11-16 (×4): 50 ug via INTRAVENOUS
  Administered 2018-11-16 (×2): 25 ug via INTRAVENOUS

## 2018-11-16 MED ORDER — FENTANYL CITRATE (PF) 100 MCG/2ML IJ SOLN: 50.0000 ug | INTRAMUSCULAR | Status: DC | PRN

## 2018-11-16 MED ORDER — MEPERIDINE HCL 50 MG/ML IJ SOLN: 6.2500 mg | INTRAMUSCULAR | Status: DC | PRN

## 2018-11-16 MED ORDER — PROMETHAZINE HCL 25 MG/ML IJ SOLN: 6.2500 mg | INTRAMUSCULAR | Status: DC | PRN

## 2018-11-16 MED ORDER — ONDANSETRON HCL 4 MG/2ML IJ SOLN
INTRAMUSCULAR | Status: DC | PRN
  Administered 2018-11-16: 4 mg via INTRAVENOUS

## 2018-11-16 MED ORDER — MIDAZOLAM HCL 2 MG/2ML IJ SOLN
INTRAMUSCULAR | Status: AC
  Filled 2018-11-16: qty 2

## 2018-11-16 MED ORDER — PROMETHAZINE HCL 25 MG/ML IJ SOLN: 25.0000 mg | Freq: Four times a day (QID) | INTRAMUSCULAR | Status: DC | PRN

## 2018-11-16 MED ORDER — PROPOFOL 10 MG/ML IV BOLUS
INTRAVENOUS | Status: AC
  Filled 2018-11-16: qty 20

## 2018-11-16 MED ORDER — ALUM & MAG HYDROXIDE-SIMETH 200-200-20 MG/5ML PO SUSP: 30.0000 mL | ORAL | Status: DC | PRN

## 2018-11-16 MED ORDER — KETOROLAC TROMETHAMINE 30 MG/ML IJ SOLN
30.0000 mg | Freq: Once | INTRAMUSCULAR | Status: AC
  Administered 2018-11-16: 30 mg via INTRAVENOUS

## 2018-11-16 MED ORDER — LACTATED RINGERS IV SOLN: INTRAVENOUS | Status: DC

## 2018-11-16 MED ORDER — MIDAZOLAM HCL 2 MG/2ML IJ SOLN
INTRAMUSCULAR | Status: DC | PRN
  Administered 2018-11-16: 2 mg via INTRAVENOUS

## 2018-11-16 MED ORDER — 0.9 % SODIUM CHLORIDE (POUR BTL) OPTIME
TOPICAL | Status: DC | PRN
  Administered 2018-11-16: 1000 mL

## 2018-11-16 SURGICAL SUPPLY — 21 items
CLOTH BEACON ORANGE TIMEOUT ST (SAFETY) ×3 IMPLANT
COVER LIGHT HANDLE STERIS (MISCELLANEOUS) ×6 IMPLANT
CURETTE VACUUM 9MM CVD CLR (CANNULA) ×3 IMPLANT
GAUZE 4X4 16PLY RFD (DISPOSABLE) ×6 IMPLANT
GAUZE SPONGE 4X4 12PLY STRL (GAUZE/BANDAGES/DRESSINGS) ×3 IMPLANT
GLOVE BIOGEL PI IND STRL 7.0 (GLOVE) ×3 IMPLANT
GLOVE BIOGEL PI IND STRL 8 (GLOVE) ×1 IMPLANT
GLOVE BIOGEL PI INDICATOR 7.0 (GLOVE) ×6
GLOVE BIOGEL PI INDICATOR 8 (GLOVE) ×2
GLOVE ECLIPSE 8.0 STRL XLNG CF (GLOVE) ×3 IMPLANT
GOWN STRL REUS W/TWL LRG LVL3 (GOWN DISPOSABLE) ×6 IMPLANT
KIT BERKELEY 1ST TRIMESTER 3/8 (MISCELLANEOUS) ×3 IMPLANT
KIT TURNOVER CYSTO (KITS) ×3 IMPLANT
MARKER SKIN DUAL TIP RULER LAB (MISCELLANEOUS) ×3 IMPLANT
NS IRRIG 1000ML POUR BTL (IV SOLUTION) ×3 IMPLANT
PACK BASIC III (CUSTOM PROCEDURE TRAY) ×2
PACK SRG BSC III STRL LF ECLPS (CUSTOM PROCEDURE TRAY) ×1 IMPLANT
PAD ARMBOARD 7.5X6 YLW CONV (MISCELLANEOUS) ×3 IMPLANT
SET BASIN LINEN APH (SET/KITS/TRAYS/PACK) ×3 IMPLANT
SET BERKELEY SUCTION TUBING (SUCTIONS) ×3 IMPLANT
SHEET LAVH (DRAPES) ×3 IMPLANT

## 2018-11-16 NOTE — Op Note (Signed)
Preoperative diagnosis:  Retained products of conception following a legal abortion with infection and hemorrhage  Postoperative diagnosis:  Same as above  Procedure:  Cervical dilation with suction and sharp uterine curettage  Surgeon:  Lazaro Arms  Anesthesia:  Laryngeal mask airway  Findings:  Denise Buck had a legal termination of pregnancy 1 month ago in [place].  She presented 30 hours ago or so with heavy vaginal bleeding.  She has required 2 units PRBC and her hemoglobin is still only 8.3.  I gave her 24+ hours of IV antibiotics since her bleeding dramatically subsided pre operatively.  Sonogram confirmed retained POC   Description of operation:  [name] was taken to the operating room and placed in the supine position.  She underwent laryngeal mask airway general anesthesia.  [name] was placed in the dorsal lithotomy position.  The vagina was prepped and draped in the usual sterile fashion.  A Graves speculum was placed.  The anterior cervix was grasped with a single-tooth tenaculum.  The cervix was dilated serially with Hegar dilators.  A #9 curved suction curette was placed in the uterus.  The suction pressure was placed at 55 and several passes were made.  All of the intrauterine contents were removed.  The sharp curette was used x1 to feel uterine crie in all areas.  [name] was given Methergine 0.2 mg IM x1.  There was good hemostasis.  [name] was given zosyn as she had been on it for >24 hours.  Estimated blood loss for the procedure was 200 cc.  [name] was awakened from anesthesia taken to the recovery room in good stable condition.  All counts were correct x3.  Denise Buck H 11/16/2018 9:07 AM

## 2018-11-16 NOTE — Transfer of Care (Signed)
Immediate Anesthesia Transfer of Care Note  Patient: Denise Buck  Procedure(s) Performed: SUCTION DILATATION AND CURETTAGE (N/A )  Patient Location: PACU  Anesthesia Type:General  Level of Consciousness: awake, alert  and oriented  Airway & Oxygen Therapy: Patient Spontanous Breathing  Post-op Assessment: Report given to RN, Post -op Vital signs reviewed and stable, Patient moving all extremities X 4 and Patient able to stick tongue midline  Post vital signs: Reviewed and stable  Last Vitals:  Vitals Value Taken Time  BP    Temp    Pulse    Resp    SpO2      Last Pain:  Vitals:   11/16/18 0737  TempSrc: Oral  PainSc: 0-No pain      Patients Stated Pain Goal: 7 (11/16/18 0737)  Complications: No apparent anesthesia complications

## 2018-11-16 NOTE — Interval H&P Note (Signed)
History and Physical Interval Note:  11/16/2018 8:22 AM  Denise Buck  has presented today for surgery, with the diagnosis of septic ab.  The various methods of treatment have been discussed with the patient and family. After consideration of risks, benefits and other options for treatment, the patient has consented to  Procedure(s): SUCTION DILATATION AND CURETTAGE (N/A) as a surgical intervention.  The patient's history has been reviewed, patient examined, no change in status, stable for surgery.  I have reviewed the patient's chart and labs.  Questions were answered to the patient's satisfaction.     Lazaro Arms

## 2018-11-16 NOTE — Anesthesia Preprocedure Evaluation (Addendum)
Anesthesia Evaluation    Airway Mallampati: II   Neck ROM: full    Dental  (+) Teeth Intact   Pulmonary former smoker,     + decreased breath sounds      Cardiovascular  Rhythm:regular     Neuro/Psych PSYCHIATRIC DISORDERS Depression    GI/Hepatic   Endo/Other    Renal/GU      Musculoskeletal   Abdominal   Peds  Hematology  (+) Blood dyscrasia, anemia ,   Anesthesia Other Findings Morbid obesity S/P AB feb 2020, Septic ab  S/p abx overnight.  Hgb 8.8 Reports last MJ use 5 days ago States npo p mn, in house overnight  Reproductive/Obstetrics                            Anesthesia Physical Anesthesia Plan  ASA: III  Anesthesia Plan: General   Post-op Pain Management:    Induction:   PONV Risk Score and Plan:   Airway Management Planned:   Additional Equipment:   Intra-op Plan:   Post-operative Plan:   Informed Consent: I have reviewed the patients History and Physical, chart, labs and discussed the procedure including the risks, benefits and alternatives for the proposed anesthesia with the patient or authorized representative who has indicated his/her understanding and acceptance.     Dental Advisory Given  Plan Discussed with: Anesthesiologist  Anesthesia Plan Comments:         Anesthesia Quick Evaluation

## 2018-11-16 NOTE — Anesthesia Postprocedure Evaluation (Signed)
Anesthesia Post Note  Patient: Denise Buck  Procedure(s) Performed: SUCTION DILATATION AND CURETTAGE (N/A )  Anesthesia Type: General     Last Vitals:  Vitals:   11/16/18 0737 11/16/18 0915  BP: (!) 117/59 (!) 107/53  Pulse: 81 73  Resp: 12 12  Temp: 36.8 C 37 C  SpO2: 98% 98%    Last Pain:  Vitals:   11/16/18 0737  TempSrc: Oral  PainSc: 0-No pain                 Candis Schatz Idriss Quackenbush

## 2018-11-16 NOTE — Anesthesia Procedure Notes (Signed)
Procedure Name: LMA Insertion Performed by: Arbie Cookey, MD Pre-anesthesia Checklist: Patient identified, Emergency Drugs available, Patient being monitored, Timeout performed and Suction available Patient Re-evaluated:Patient Re-evaluated prior to induction Oxygen Delivery Method: Circle system utilized Preoxygenation: Pre-oxygenation with 100% oxygen Induction Type: IV induction Ventilation: Mask ventilation without difficulty LMA: LMA inserted LMA Size: 3.0 Number of attempts: 1

## 2018-11-17 LAB — CBC
HCT: 24.8 % — ABNORMAL LOW (ref 36.0–46.0)
Hemoglobin: 7.5 g/dL — ABNORMAL LOW (ref 12.0–15.0)
MCH: 27.7 pg (ref 26.0–34.0)
MCHC: 30.2 g/dL (ref 30.0–36.0)
MCV: 91.5 fL (ref 80.0–100.0)
Platelets: 192 10*3/uL (ref 150–400)
RBC: 2.71 MIL/uL — ABNORMAL LOW (ref 3.87–5.11)
RDW: 14.8 % (ref 11.5–15.5)
WBC: 7.8 10*3/uL (ref 4.0–10.5)
nRBC: 0 % (ref 0.0–0.2)

## 2018-11-17 MED ORDER — METRONIDAZOLE 500 MG PO TABS: 500.0000 mg | ORAL_TABLET | Freq: Two times a day (BID) | ORAL | 0 refills | Status: DC

## 2018-11-17 MED ORDER — MISOPROSTOL 200 MCG PO TABS: 200.0000 ug | ORAL_TABLET | Freq: Three times a day (TID) | ORAL | 0 refills | Status: DC

## 2018-11-17 MED ORDER — KETOROLAC TROMETHAMINE 10 MG PO TABS: 10.0000 mg | ORAL_TABLET | Freq: Three times a day (TID) | ORAL | 0 refills | Status: DC | PRN

## 2018-11-17 MED ORDER — CIPROFLOXACIN HCL 500 MG PO TABS: 500.0000 mg | ORAL_TABLET | Freq: Two times a day (BID) | ORAL | 0 refills | Status: DC

## 2018-11-17 NOTE — Progress Notes (Signed)
Removed IV-clean, dry, intact. Reviewed d/c paperwork with patient. Reviewed new medications and where to pick up. Answered all questions. Kayla walked stable patient and belongings to short stay entrance where she was picked up by boyfriend

## 2018-11-17 NOTE — Discharge Instructions (Signed)
Incomplete Miscarriage  A miscarriage is the loss of an unborn baby (fetus) before the 20th week of pregnancy. In an incomplete miscarriage, parts of the fetus or placenta (afterbirth) remain in the body. Most miscarriages happen in the first 3 months of pregnancy. Sometimes, it happens before a woman even knows she is pregnant.  Having a miscarriage can be an emotional experience. If you have had a miscarriage, talk with your health care provider about any questions you may have about miscarrying, the grieving process, and your future pregnancy plans.  What are the causes?  This condition may be caused by:  · Problems with the genes or chromosomes that make it impossible for the baby to develop normally. These problems are most often the result of random errors that occur early in development, and are not passed from parent to child (not inherited).  · Infection of the cervix or uterus.  · Conditions that affect hormone balance in the body.  · Problems with the cervix, such as the cervix opening and thinning before pregnancy is at term (cervical insufficiency).  · Problems with the uterus, such as a uterus with an abnormal shape, fibroids in the uterus, or problems that were present from birth (congenital abnormalities).  · Certain medical conditions.  · Smoking, drinking alcohol, or using drugs.  · Injury (trauma).  Many times, the cause of a miscarriage is not known.  What are the signs or symptoms?  Symptoms of this condition include:  · Vaginal bleeding or spotting, with or without cramps or pain.  · Pain or cramping in the abdomen or lower back.  · Passing fluid, tissue, or blood clots from the vagina.  How is this diagnosed?  This condition may be diagnosed based on:  · A physical exam.  · Ultrasound.  · Blood tests.  · Urine tests.  How is this treated?  An incomplete miscarriage may be treated with:  · Dilation and curettage (D&C). This is a procedure in which the cervix is stretched open and the lining of  the uterus (endometrium) is scraped to remove any remaining tissue from the pregnancy.  · Medicines, such as:  ? Antibiotic medicine to treat infection.  ? Medicine to help any remaining tissue pass out of your uterus.  ? Medicine to reduce (contract) the size of the uterus. These medicines may be given if you have a lot of bleeding.  If you have Rh negative blood and your baby was Rh positive, you will need a shot of medicine called Rh immunoglobulinto protect future babies from Rh blood problems. "Rh-negative" and "Rh-positive" refer to whether or not the blood has a specific protein found on the surface of red blood cells (Rh factor).  Follow these instructions at home:  Medicines    · Take over-the-counter and prescription medicines only as told by your health care provider.  · If you were prescribed antibiotic medicine, take your antibiotic as told by your health care provider. Do not stop taking the antibiotic even if you start to feel better.  · Do not take NSAIDs, such as aspirin and ibuprofen, unless approved by your doctor. These medicines can cause bleeding.  Activity  · Rest as directed. Ask your health care provider what activities are safe for you.  · Have someone help with home and family responsibilities during this time.  General instructions  · Keep track of the number of sanitary pads you use each day and how soaked (saturated) they are. Write down this   information.  · Monitor the amount of tissue or blood clots that you pass from your vagina. Save any large amounts of tissue for your health care provider to examine.  · Do not use tampons, douche, or have sex until your health care provider approves.  · To help you and your partner with the process of grieving, talk with your health care provider or seek counseling to help cope with the pregnancy loss.  · When you are ready, meet with your health care provider to discuss important steps you should take for your health, as well as steps to take in  order to have a healthy pregnancy in the future.  · Keep all follow-up visits as told by your health care provider. This is important.  Where to find more information  · The American Congress of Obstetricians and Gynecologists: www.acog.org  · U.S. Department of Health and Human Services Office of Women’s Health: www.womenshealth.gov  Contact a health care provider if:  · You have a fever or chills.  · You have a foul smelling vaginal discharge.  Get help right away if:  · You have severe cramps or pain in your back or abdomen.  · You pass walnut-sized (or larger) blood clots or tissue from your vagina.  · You have heavy bleeding, soaking more than 1 regular sanitary pad in an hour.  · You become lightheaded or weak.  · You pass out.  · You have feelings of sadness that take over your thoughts, or you have thoughts of hurting yourself.  Summary  · In an incomplete miscarriage, parts of the fetus or placenta (afterbirth) remain in the body.  · There are multiple treatment options for an incomplete miscarriage, talk to your health care provider about the best option for you.  · Follow your health care provider's instructions for follow-up care.  · To help you and your partner with the process of grieving, talk with your health care provider or seek counseling to help cope with the pregnancy loss.  This information is not intended to replace advice given to you by your health care provider. Make sure you discuss any questions you have with your health care provider.  Document Released: 08/07/2005 Document Revised: 09/13/2016 Document Reviewed: 09/13/2016  Elsevier Interactive Patient Education © 2019 Elsevier Inc.

## 2018-11-17 NOTE — Discharge Summary (Signed)
Physician Discharge Summary  Patient ID:  Tele discharge  Denise Buck MRN: [id number] DOB/AGE: March 22, 1991 28 y.o.  Admit date: 11/15/2018 Discharge date: 11/17/2018  Admission Diagnoses: Retained POC with infection s/p pregnancy termination Discharge Diagnoses:  Active Problems:   Vagina bleeding   Discharged Condition: good  Hospital Course: received 2 U PRBC, antibiotics, underwent D&C, pot procedure antibiotics  Consults: None  Significant Diagnostic Studies: sonogram  Treatments: as above  Discharge Exam: Blood pressure (!) 152/72, pulse 93, temperature 98.6 F (37 C), temperature source Oral, resp. rate 18, height 5\' 8"  (1.727 m), weight 133.4 kg, last menstrual period 10/20/2018, SpO2 100 %, not currently breastfeeding. General appearance: alert, cooperative and no distress GI: soft, non-tender; bowel sounds normal; no masses,  no organomegaly  Disposition: Discharge disposition: 01-Home or Self Care       Discharge Instructions    Diet - low sodium heart healthy   Complete by:  As directed    Increase activity slowly   Complete by:  As directed    Sexual Activity Restrictions   Complete by:  As directed    No sex for 6 weeks     Allergies as of 11/17/2018      Reactions   Hydrocodone Itching, Nausea And Vomiting      Medication List    TAKE these medications   ciprofloxacin 500 MG tablet Commonly known as:  Cipro Take 1 tablet (500 mg total) by mouth 2 (two) times daily.   ketorolac 10 MG tablet Commonly known as:  TORADOL Take 1 tablet (10 mg total) by mouth every 8 (eight) hours as needed.   metroNIDAZOLE 500 MG tablet Commonly known as:  Flagyl Take 1 tablet (500 mg total) by mouth 2 (two) times daily.   misoprostol 200 MCG tablet Commonly known as:  Cytotec Take 1 tablet (200 mcg total) by mouth 3 (three) times daily.      Follow-up Information    Lazaro Arms, MD Follow up on 11/22/2018.   Specialties:  Obstetrics and  Gynecology, Radiology Contact information: [address] [place] [address] [phone]           Signed: Lazaro Arms 11/17/2018, 4:56 PM

## 2018-11-20 ENCOUNTER — Encounter (HOSPITAL_COMMUNITY): Payer: Self-pay | Admitting: Obstetrics & Gynecology

## 2018-11-21 ENCOUNTER — Telehealth: Payer: Self-pay | Admitting: Obstetrics & Gynecology

## 2018-11-21 NOTE — Telephone Encounter (Signed)
Pt states that she needs a hospital follow-up appt with Dr. Despina Hidden. Pt was discharged from the hospital on 11/17/2018. Pt is still bleeding. Pt states that she also needs an appt for a STD screening.

## 2018-11-22 NOTE — Telephone Encounter (Signed)
Her bleeding is normal Next week for office visit anytime is ok

## 2018-11-25 ENCOUNTER — Telehealth: Payer: Self-pay | Admitting: *Deleted

## 2018-11-25 NOTE — Telephone Encounter (Signed)
Patient informed that we are not allowing visitors or children to come to appointments at this time. Patient denies any contact with anyone suspected or confirmed of having COVID-19. Pt denies fever, cough, sob, muscle pain, diarrhea, rash, vomiting, abdominal pain, red eye, weakness, bruising or bleeding, joint pain or severe headache.  

## 2018-11-26 ENCOUNTER — Other Ambulatory Visit: Payer: Self-pay

## 2018-11-26 ENCOUNTER — Encounter: Payer: Self-pay | Admitting: Obstetrics & Gynecology

## 2018-11-26 ENCOUNTER — Ambulatory Visit: Payer: Medicaid Other | Admitting: Obstetrics & Gynecology

## 2018-11-26 VITALS — BP 130/76 | HR 91 | Temp 98.3°F | Ht 67.0 in | Wt 301.0 lb

## 2018-11-26 DIAGNOSIS — Z113 Encounter for screening for infections with a predominantly sexual mode of transmission: Secondary | ICD-10-CM | POA: Diagnosis not present

## 2018-11-26 DIAGNOSIS — Z3202 Encounter for pregnancy test, result negative: Secondary | ICD-10-CM | POA: Diagnosis not present

## 2018-11-26 DIAGNOSIS — Z9889 Other specified postprocedural states: Secondary | ICD-10-CM

## 2018-11-26 LAB — POCT URINE PREGNANCY: Preg Test, Ur: NEGATIVE

## 2018-11-26 MED ORDER — DESOGESTREL-ETHINYL ESTRADIOL 0.15-30 MG-MCG PO TABS: 1.0000 | ORAL_TABLET | Freq: Every day | ORAL | 11 refills | Status: DC

## 2018-11-26 NOTE — Progress Notes (Signed)
  HPI: Patient returns for routine postoperative follow-up having undergone suction sharp D&C on 11/16/2018.  The patient's immediate postoperative recovery has been unremarkable. Since hospital discharge the patient reports no problems.   No current outpatient medications on file. No current facility-administered medications for this visit.     Blood pressure 130/76, pulse 91, temperature 98.3 F (36.8 C), height 5\' 7"  (1.702 m), weight (!) 301 lb (136.5 kg), not currently breastfeeding.  Physical Exam: Normal pelvic exam STI check per pt request  Diagnostic Tests: STI  Pathology: Benign POC  Impression: S/p D&C for retained POC after legal abortion 1 month prior  Plan: S/p D&C  Follow up: prn    Lazaro Arms, MD

## 2018-11-29 LAB — NUSWAB VAGINITIS PLUS (VG+)
Candida albicans, NAA: POSITIVE — AB
Candida glabrata, NAA: POSITIVE — AB
Chlamydia trachomatis, NAA: NEGATIVE
Neisseria gonorrhoeae, NAA: NEGATIVE
Trich vag by NAA: NEGATIVE

## 2018-12-02 ENCOUNTER — Telehealth: Payer: Self-pay | Admitting: Obstetrics & Gynecology

## 2018-12-02 NOTE — Telephone Encounter (Signed)
Patient called stating that she would like to know the results of her blood work she had done last week. Please contact pt

## 2018-12-03 NOTE — Telephone Encounter (Signed)
Pt informed of negative std screening. Told her that she did have some yeast on swab. She admits to some itching, no discharge. Advised that she can use monistat otc to treat. No further questions or concerns at this time.

## 2019-03-06 ENCOUNTER — Telehealth: Payer: Self-pay | Admitting: Adult Health

## 2019-03-06 NOTE — Telephone Encounter (Signed)
Patient called, would like to be tested for STD's, please advise.  [phone]

## 2019-03-20 NOTE — Telephone Encounter (Signed)
Pt returning call to the nurse. Pt leaving on Monday for [place] for 1 month.

## 2019-04-23 ENCOUNTER — Telehealth: Payer: Self-pay | Admitting: *Deleted

## 2019-04-23 ENCOUNTER — Other Ambulatory Visit: Payer: Self-pay

## 2019-04-23 ENCOUNTER — Other Ambulatory Visit: Payer: Medicaid Other

## 2019-04-23 DIAGNOSIS — Z113 Encounter for screening for infections with a predominantly sexual mode of transmission: Secondary | ICD-10-CM

## 2019-04-23 NOTE — Telephone Encounter (Signed)
Patient called stating she would like to be tested for STD's.  Informed she can come and do self swab with the nurse.  Verbalized understanding and will come this afternoon.

## 2019-04-24 DIAGNOSIS — Z113 Encounter for screening for infections with a predominantly sexual mode of transmission: Secondary | ICD-10-CM | POA: Diagnosis not present

## 2019-04-28 LAB — NUSWAB VAGINITIS PLUS (VG+)
Atopobium vaginae: HIGH Score — AB
Candida albicans, NAA: NEGATIVE
Candida glabrata, NAA: NEGATIVE
Chlamydia trachomatis, NAA: NEGATIVE
Megasphaera 1: HIGH Score — AB
Neisseria gonorrhoeae, NAA: NEGATIVE
Trich vag by NAA: POSITIVE — AB

## 2019-04-29 ENCOUNTER — Other Ambulatory Visit: Payer: Self-pay | Admitting: Women's Health

## 2019-04-29 DIAGNOSIS — A599 Trichomoniasis, unspecified: Secondary | ICD-10-CM

## 2019-04-29 MED ORDER — METRONIDAZOLE 500 MG PO TABS: 500.0000 mg | ORAL_TABLET | Freq: Two times a day (BID) | ORAL | 0 refills | Status: DC

## 2019-05-19 ENCOUNTER — Other Ambulatory Visit: Payer: Self-pay | Admitting: Women's Health

## 2019-05-19 MED ORDER — METRONIDAZOLE 500 MG PO TABS: 500.0000 mg | ORAL_TABLET | Freq: Two times a day (BID) | ORAL | 0 refills | Status: DC

## 2019-11-14 ENCOUNTER — Ambulatory Visit: Payer: Medicaid Other | Admitting: Obstetrics & Gynecology

## 2019-11-26 ENCOUNTER — Encounter: Payer: Self-pay | Admitting: Adult Health

## 2019-11-26 ENCOUNTER — Other Ambulatory Visit: Payer: Self-pay

## 2019-11-26 ENCOUNTER — Ambulatory Visit (INDEPENDENT_AMBULATORY_CARE_PROVIDER_SITE_OTHER): Payer: Medicaid Other | Admitting: Adult Health

## 2019-11-26 VITALS — BP 131/84 | HR 79 | Ht 67.0 in | Wt 305.0 lb

## 2019-11-26 DIAGNOSIS — Z3201 Encounter for pregnancy test, result positive: Secondary | ICD-10-CM | POA: Diagnosis not present

## 2019-11-26 DIAGNOSIS — Z3A01 Less than 8 weeks gestation of pregnancy: Secondary | ICD-10-CM | POA: Insufficient documentation

## 2019-11-26 DIAGNOSIS — O3680X0 Pregnancy with inconclusive fetal viability, not applicable or unspecified: Secondary | ICD-10-CM | POA: Diagnosis not present

## 2019-11-26 LAB — POCT URINE PREGNANCY: Preg Test, Ur: POSITIVE — AB

## 2019-11-26 MED ORDER — PRENATAL PLUS 27-1 MG PO TABS: 1.0000 | ORAL_TABLET | Freq: Every day | ORAL | 12 refills | Status: DC

## 2019-11-26 NOTE — Progress Notes (Signed)
  Subjective:     Patient ID: Denise Buck, female   DOB: 09/25/1990, 29 y.o.   MRN: [id number]  HPI Denise Buck is a 29 year old white female,single, in to discuss birth control options and had +UPT, in office today, she was surprised, and not sure what she is going to do yet.  She had TAB last march with retained POC and had D&C and blood transfusion.   Review of Systems Had been having heavy periods, since stopping OCs in November, skipped March period  Reviewed past medical,surgical, social and family history. Reviewed medications and allergies.     Objective:   Physical Exam BP 131/84 (BP Location: Left Arm, Patient Position: Sitting, Cuff Size: Large)   Pulse 79   Ht 5\' 7"  (1.702 m)   Wt (!) 305 lb (138.3 kg)   LMP 10/17/2019 (Approximate)   BMI 47.77 kg/m UPT +, about 5+5 weeks by LMP with EDD 07/23/20.Skin warm and dry. Neck: mid line trachea, normal thyroid, good ROM, no lymphadenopathy noted. Lungs: clear to ausculation bilaterally. Cardiovascular: regular rate and rhythm. Abdomen is soft and non tender. Fall risk is low.    Assessment:     1. Pregnancy test positive Eat often and chew gum or suck hard candy if any nausea   2. Less than [redacted] weeks gestation of pregnancy Will rx PNV Meds ordered this encounter  Medications  . prenatal vitamin w/FE, FA (PRENATAL 1 + 1) 27-1 MG TABS tablet    Sig: Take 1 tablet by mouth daily at 12 noon.    Dispense:  30 tablet    Refill:  12    Order Specific Question:   Supervising Provider    Answer:   14/3/21, LUTHER H [[id number]]    3. Encounter to determine fetal viability of pregnancy, single or unspecified fetus Return in 1 week for dating Despina Hidden     Plan:     Review handout by [facility]

## 2019-12-03 ENCOUNTER — Other Ambulatory Visit: Payer: Medicaid Other

## 2019-12-04 ENCOUNTER — Inpatient Hospital Stay (HOSPITAL_COMMUNITY): Payer: Medicaid Other | Admitting: Certified Registered Nurse Anesthetist

## 2019-12-04 ENCOUNTER — Observation Stay (HOSPITAL_COMMUNITY)
Admission: AD | Admit: 2019-12-04 | Discharge: 2019-12-05 | Disposition: A | Discharge status: Home / Self Care | Payer: Medicaid Other | Attending: Obstetrics & Gynecology | Admitting: Obstetrics & Gynecology

## 2019-12-04 ENCOUNTER — Inpatient Hospital Stay (HOSPITAL_COMMUNITY): Payer: Medicaid Other

## 2019-12-04 ENCOUNTER — Encounter (HOSPITAL_COMMUNITY): Payer: Self-pay | Admitting: Obstetrics and Gynecology

## 2019-12-04 ENCOUNTER — Encounter (HOSPITAL_COMMUNITY)
Admission: AD | Disposition: A | Discharge status: Home / Self Care | Payer: Self-pay | Source: Home / Self Care | Attending: Obstetrics & Gynecology

## 2019-12-04 ENCOUNTER — Other Ambulatory Visit: Payer: Self-pay

## 2019-12-04 ENCOUNTER — Telehealth: Payer: Self-pay | Admitting: *Deleted

## 2019-12-04 DIAGNOSIS — Z20822 Contact with and (suspected) exposure to covid-19: Secondary | ICD-10-CM | POA: Insufficient documentation

## 2019-12-04 DIAGNOSIS — O209 Hemorrhage in early pregnancy, unspecified: Secondary | ICD-10-CM | POA: Diagnosis not present

## 2019-12-04 DIAGNOSIS — Z87891 Personal history of nicotine dependence: Secondary | ICD-10-CM | POA: Insufficient documentation

## 2019-12-04 DIAGNOSIS — O00102 Left tubal pregnancy without intrauterine pregnancy: Principal | ICD-10-CM | POA: Insufficient documentation

## 2019-12-04 DIAGNOSIS — K661 Hemoperitoneum: Secondary | ICD-10-CM | POA: Diagnosis not present

## 2019-12-04 DIAGNOSIS — I959 Hypotension, unspecified: Secondary | ICD-10-CM | POA: Insufficient documentation

## 2019-12-04 DIAGNOSIS — Z885 Allergy status to narcotic agent status: Secondary | ICD-10-CM | POA: Diagnosis not present

## 2019-12-04 DIAGNOSIS — Z3A00 Weeks of gestation of pregnancy not specified: Secondary | ICD-10-CM | POA: Diagnosis not present

## 2019-12-04 DIAGNOSIS — T8189XA Other complications of procedures, not elsewhere classified, initial encounter: Secondary | ICD-10-CM | POA: Diagnosis not present

## 2019-12-04 DIAGNOSIS — O00101 Right tubal pregnancy without intrauterine pregnancy: Secondary | ICD-10-CM | POA: Diagnosis present

## 2019-12-04 HISTORY — PX: LAPAROSCOPIC UNILATERAL SALPINGECTOMY: SHX5934

## 2019-12-04 HISTORY — PX: DIAGNOSTIC LAPAROSCOPY WITH REMOVAL OF ECTOPIC PREGNANCY: SHX6449

## 2019-12-04 LAB — CBC WITH DIFFERENTIAL/PLATELET
Abs Immature Granulocytes: 0.07 10*3/uL (ref 0.00–0.07)
Basophils Absolute: 0.1 10*3/uL (ref 0.0–0.1)
Basophils Relative: 1 %
Eosinophils Absolute: 0.1 10*3/uL (ref 0.0–0.5)
Eosinophils Relative: 1 %
HCT: 36.2 % (ref 36.0–46.0)
Hemoglobin: 11.5 g/dL — ABNORMAL LOW (ref 12.0–15.0)
Immature Granulocytes: 1 %
Lymphocytes Relative: 21 %
Lymphs Abs: 2.2 10*3/uL (ref 0.7–4.0)
MCH: 27 pg (ref 26.0–34.0)
MCHC: 31.8 g/dL (ref 30.0–36.0)
MCV: 85 fL (ref 80.0–100.0)
Monocytes Absolute: 0.5 10*3/uL (ref 0.1–1.0)
Monocytes Relative: 5 %
Neutro Abs: 7.8 10*3/uL — ABNORMAL HIGH (ref 1.7–7.7)
Neutrophils Relative %: 71 %
Platelets: 309 10*3/uL (ref 150–400)
RBC: 4.26 MIL/uL (ref 3.87–5.11)
RDW: 14.8 % (ref 11.5–15.5)
WBC: 10.8 10*3/uL — ABNORMAL HIGH (ref 4.0–10.5)
nRBC: 0 % (ref 0.0–0.2)

## 2019-12-04 LAB — CBC
HCT: 31.4 % — ABNORMAL LOW (ref 36.0–46.0)
HCT: 32.7 % — ABNORMAL LOW (ref 36.0–46.0)
HCT: 34.3 % — ABNORMAL LOW (ref 36.0–46.0)
Hemoglobin: 10.1 g/dL — ABNORMAL LOW (ref 12.0–15.0)
Hemoglobin: 10.4 g/dL — ABNORMAL LOW (ref 12.0–15.0)
Hemoglobin: 10.4 g/dL — ABNORMAL LOW (ref 12.0–15.0)
MCH: 26.9 pg (ref 26.0–34.0)
MCH: 27.9 pg (ref 26.0–34.0)
MCH: 28.1 pg (ref 26.0–34.0)
MCHC: 30.3 g/dL (ref 30.0–36.0)
MCHC: 31.8 g/dL (ref 30.0–36.0)
MCHC: 32.2 g/dL (ref 30.0–36.0)
MCV: 87.2 fL (ref 80.0–100.0)
MCV: 87.7 fL (ref 80.0–100.0)
MCV: 88.6 fL (ref 80.0–100.0)
Platelets: 237 10*3/uL (ref 150–400)
Platelets: 237 10*3/uL (ref 150–400)
Platelets: 346 10*3/uL (ref 150–400)
RBC: 3.6 MIL/uL — ABNORMAL LOW (ref 3.87–5.11)
RBC: 3.73 MIL/uL — ABNORMAL LOW (ref 3.87–5.11)
RBC: 3.87 MIL/uL (ref 3.87–5.11)
RDW: 14.6 % (ref 11.5–15.5)
RDW: 14.7 % (ref 11.5–15.5)
RDW: 14.9 % (ref 11.5–15.5)
WBC: 12.7 10*3/uL — ABNORMAL HIGH (ref 4.0–10.5)
WBC: 16.2 10*3/uL — ABNORMAL HIGH (ref 4.0–10.5)
WBC: 18.3 10*3/uL — ABNORMAL HIGH (ref 4.0–10.5)
nRBC: 0 % (ref 0.0–0.2)
nRBC: 0 % (ref 0.0–0.2)
nRBC: 0 % (ref 0.0–0.2)

## 2019-12-04 LAB — WET PREP, GENITAL
Clue Cells Wet Prep HPF POC: NONE SEEN
Sperm: NONE SEEN
Trich, Wet Prep: NONE SEEN
Yeast Wet Prep HPF POC: NONE SEEN

## 2019-12-04 LAB — RESPIRATORY PANEL BY RT PCR (FLU A&B, COVID)
Influenza A by PCR: NEGATIVE
Influenza B by PCR: NEGATIVE
SARS Coronavirus 2 by RT PCR: NEGATIVE

## 2019-12-04 LAB — COMPREHENSIVE METABOLIC PANEL
ALT: 16 U/L (ref 0–44)
AST: 14 U/L — ABNORMAL LOW (ref 15–41)
Albumin: 3.2 g/dL — ABNORMAL LOW (ref 3.5–5.0)
Alkaline Phosphatase: 60 U/L (ref 38–126)
Anion gap: 8 (ref 5–15)
BUN: 17 mg/dL (ref 6–20)
CO2: 20 mmol/L — ABNORMAL LOW (ref 22–32)
Calcium: 8.6 mg/dL — ABNORMAL LOW (ref 8.9–10.3)
Chloride: 109 mmol/L (ref 98–111)
Creatinine, Ser: 0.79 mg/dL (ref 0.44–1.00)
GFR calc Af Amer: >60 mL/min (ref >60)
GFR calc non Af Amer: >60 mL/min (ref >60)
Glucose, Bld: 147 mg/dL — ABNORMAL HIGH (ref 70–99)
Potassium: 3.8 mmol/L (ref 3.5–5.1)
Sodium: 137 mmol/L (ref 135–145)
Total Bilirubin: 0.7 mg/dL (ref 0.3–1.2)
Total Protein: 6 g/dL — ABNORMAL LOW (ref 6.5–8.1)

## 2019-12-04 LAB — BASIC METABOLIC PANEL
Anion gap: 4 — ABNORMAL LOW (ref 5–15)
BUN: 15 mg/dL (ref 6–20)
CO2: 23 mmol/L (ref 22–32)
Calcium: 7.6 mg/dL — ABNORMAL LOW (ref 8.9–10.3)
Chloride: 111 mmol/L (ref 98–111)
Creatinine, Ser: 0.71 mg/dL (ref 0.44–1.00)
GFR calc Af Amer: >60 mL/min (ref >60)
GFR calc non Af Amer: >60 mL/min (ref >60)
Glucose, Bld: 157 mg/dL — ABNORMAL HIGH (ref 70–99)
Potassium: 4.8 mmol/L (ref 3.5–5.1)
Sodium: 138 mmol/L (ref 135–145)

## 2019-12-04 LAB — ABO/RH: ABO/RH(D): O POS

## 2019-12-04 LAB — PREPARE RBC (CROSSMATCH)

## 2019-12-04 LAB — HCG, QUANTITATIVE, PREGNANCY: hCG, Beta Chain, Quant, S: 6929 m[IU]/mL — ABNORMAL HIGH (ref <5)

## 2019-12-04 SURGERY — LAPAROSCOPY, WITH ECTOPIC PREGNANCY SURGICAL TREATMENT
Anesthesia: General | Site: Abdomen

## 2019-12-04 MED ORDER — ONDANSETRON HCL 4 MG/2ML IJ SOLN: 4.0000 mg | Freq: Once | INTRAMUSCULAR | Status: DC | PRN

## 2019-12-04 MED ORDER — BUPIVACAINE HCL (PF) 0.5 % IJ SOLN
INTRAMUSCULAR | Status: AC
  Filled 2019-12-04: qty 30

## 2019-12-04 MED ORDER — SUCCINYLCHOLINE CHLORIDE 200 MG/10ML IV SOSY
PREFILLED_SYRINGE | INTRAVENOUS | Status: DC | PRN
  Administered 2019-12-04: 120 mg via INTRAVENOUS

## 2019-12-04 MED ORDER — FENTANYL CITRATE (PF) 100 MCG/2ML IJ SOLN
25.0000 ug | INTRAMUSCULAR | Status: DC | PRN
  Administered 2019-12-04: 50 ug via INTRAVENOUS

## 2019-12-04 MED ORDER — MENTHOL 3 MG MT LOZG: 1.0000 | LOZENGE | OROMUCOSAL | Status: DC | PRN

## 2019-12-04 MED ORDER — LIDOCAINE 2% (20 MG/ML) 5 ML SYRINGE
INTRAMUSCULAR | Status: DC | PRN
  Administered 2019-12-04: 60 mg via INTRAVENOUS

## 2019-12-04 MED ORDER — IBUPROFEN 800 MG PO TABS: 800.0000 mg | ORAL_TABLET | Freq: Four times a day (QID) | ORAL | Status: DC

## 2019-12-04 MED ORDER — SODIUM CHLORIDE 0.9 % IV SOLN: INTRAVENOUS | Status: DC

## 2019-12-04 MED ORDER — SUGAMMADEX SODIUM 200 MG/2ML IV SOLN
INTRAVENOUS | Status: DC | PRN
  Administered 2019-12-04: 400 mg via INTRAVENOUS

## 2019-12-04 MED ORDER — BUPIVACAINE HCL 0.5 % IJ SOLN
INTRAMUSCULAR | Status: DC | PRN
  Administered 2019-12-04: 15 mL

## 2019-12-04 MED ORDER — BISACODYL 10 MG RE SUPP: 10.0000 mg | Freq: Every day | RECTAL | Status: DC | PRN

## 2019-12-04 MED ORDER — ACETAMINOPHEN 500 MG PO TABS
1000.0000 mg | ORAL_TABLET | Freq: Once | ORAL | Status: DC
  Filled 2019-12-04: qty 2

## 2019-12-04 MED ORDER — ROCURONIUM BROMIDE 50 MG/5ML IV SOSY
PREFILLED_SYRINGE | INTRAVENOUS | Status: DC | PRN
  Administered 2019-12-04: 20 mg via INTRAVENOUS
  Administered 2019-12-04: 50 mg via INTRAVENOUS

## 2019-12-04 MED ORDER — SIMETHICONE 80 MG PO CHEW: 80.0000 mg | CHEWABLE_TABLET | Freq: Four times a day (QID) | ORAL | Status: DC

## 2019-12-04 MED ORDER — MAGNESIUM CITRATE PO SOLN: 1.0000 | Freq: Once | ORAL | Status: DC | PRN

## 2019-12-04 MED ORDER — PHENYLEPHRINE 40 MCG/ML (10ML) SYRINGE FOR IV PUSH (FOR BLOOD PRESSURE SUPPORT)
PREFILLED_SYRINGE | INTRAVENOUS | Status: DC | PRN
  Administered 2019-12-04: 120 ug via INTRAVENOUS

## 2019-12-04 MED ORDER — ONDANSETRON 4 MG PO TBDP
4.0000 mg | ORAL_TABLET | Freq: Once | ORAL | Status: AC
  Administered 2019-12-04: 4 mg via ORAL

## 2019-12-04 MED ORDER — PROPOFOL 10 MG/ML IV BOLUS
INTRAVENOUS | Status: DC | PRN
  Administered 2019-12-04: 150 mg via INTRAVENOUS

## 2019-12-04 MED ORDER — ALUM & MAG HYDROXIDE-SIMETH 200-200-20 MG/5ML PO SUSP: 30.0000 mL | ORAL | Status: DC | PRN

## 2019-12-04 MED ORDER — ONDANSETRON HCL 4 MG/2ML IJ SOLN
INTRAMUSCULAR | Status: DC | PRN
  Administered 2019-12-04: 4 mg via INTRAVENOUS

## 2019-12-04 MED ORDER — MORPHINE SULFATE (PF) 2 MG/ML IV SOLN
1.0000 mg | INTRAVENOUS | Status: DC | PRN
  Administered 2019-12-04 – 2019-12-05 (×4): 2 mg via INTRAVENOUS
  Filled 2019-12-04 (×4): qty 1

## 2019-12-04 MED ORDER — SODIUM CHLORIDE 0.9% IV SOLUTION: Freq: Once | INTRAVENOUS | Status: AC

## 2019-12-04 MED ORDER — HYDROMORPHONE HCL 2 MG PO TABS
2.0000 mg | ORAL_TABLET | ORAL | Status: DC | PRN
  Administered 2019-12-05 (×2): 2 mg via ORAL
  Filled 2019-12-04 (×2): qty 1

## 2019-12-04 MED ORDER — DEXAMETHASONE SODIUM PHOSPHATE 10 MG/ML IJ SOLN
INTRAMUSCULAR | Status: DC | PRN
  Administered 2019-12-04: 10 mg via INTRAVENOUS

## 2019-12-04 MED ORDER — KETOROLAC TROMETHAMINE 30 MG/ML IJ SOLN
30.0000 mg | Freq: Once | INTRAMUSCULAR | Status: AC
  Administered 2019-12-04: 30 mg via INTRAMUSCULAR
  Filled 2019-12-04: qty 1

## 2019-12-04 MED ORDER — MIDAZOLAM HCL 5 MG/5ML IJ SOLN
INTRAMUSCULAR | Status: DC | PRN
  Administered 2019-12-04: 2 mg via INTRAVENOUS

## 2019-12-04 MED ORDER — ONDANSETRON HCL 4 MG PO TABS: 4.0000 mg | ORAL_TABLET | Freq: Four times a day (QID) | ORAL | Status: DC | PRN

## 2019-12-04 MED ORDER — FENTANYL CITRATE (PF) 250 MCG/5ML IJ SOLN
INTRAMUSCULAR | Status: AC
  Filled 2019-12-04: qty 5

## 2019-12-04 MED ORDER — DOCUSATE SODIUM 100 MG PO CAPS
100.0000 mg | ORAL_CAPSULE | Freq: Two times a day (BID) | ORAL | Status: DC
  Administered 2019-12-04 – 2019-12-05 (×2): 100 mg via ORAL
  Filled 2019-12-04 (×2): qty 1

## 2019-12-04 MED ORDER — SIMETHICONE 80 MG PO CHEW
80.0000 mg | CHEWABLE_TABLET | Freq: Four times a day (QID) | ORAL | Status: DC
  Administered 2019-12-04 – 2019-12-05 (×3): 80 mg via ORAL
  Filled 2019-12-04 (×3): qty 1

## 2019-12-04 MED ORDER — TEMAZEPAM 15 MG PO CAPS: 15.0000 mg | ORAL_CAPSULE | Freq: Every evening | ORAL | Status: DC | PRN

## 2019-12-04 MED ORDER — PROMETHAZINE HCL 25 MG/ML IJ SOLN
25.0000 mg | Freq: Once | INTRAMUSCULAR | Status: AC
  Administered 2019-12-04: 12:00:00 25 mg via INTRAVENOUS
  Filled 2019-12-04: qty 1

## 2019-12-04 MED ORDER — KETOROLAC TROMETHAMINE 30 MG/ML IJ SOLN
30.0000 mg | Freq: Four times a day (QID) | INTRAMUSCULAR | Status: AC
  Administered 2019-12-04 – 2019-12-05 (×4): 30 mg via INTRAVENOUS
  Filled 2019-12-04 (×3): qty 1

## 2019-12-04 MED ORDER — PHENYLEPHRINE HCL-NACL 10-0.9 MG/250ML-% IV SOLN
INTRAVENOUS | Status: DC | PRN
  Administered 2019-12-04: 30 ug/min via INTRAVENOUS

## 2019-12-04 MED ORDER — FENTANYL CITRATE (PF) 250 MCG/5ML IJ SOLN
INTRAMUSCULAR | Status: DC | PRN
  Administered 2019-12-04 (×5): 50 ug via INTRAVENOUS

## 2019-12-04 MED ORDER — MIDAZOLAM HCL 2 MG/2ML IJ SOLN
INTRAMUSCULAR | Status: AC
  Filled 2019-12-04: qty 2

## 2019-12-04 MED ORDER — PANTOPRAZOLE SODIUM 40 MG PO TBEC
40.0000 mg | DELAYED_RELEASE_TABLET | Freq: Every day | ORAL | Status: DC
  Administered 2019-12-04 – 2019-12-05 (×2): 40 mg via ORAL
  Filled 2019-12-04 (×2): qty 1

## 2019-12-04 MED ORDER — PROPOFOL 10 MG/ML IV BOLUS
INTRAVENOUS | Status: AC
  Filled 2019-12-04: qty 20

## 2019-12-04 MED ORDER — LACTATED RINGERS IV SOLN: INTRAVENOUS | Status: DC | PRN

## 2019-12-04 MED ORDER — LACTATED RINGERS IV SOLN: Freq: Once | INTRAVENOUS | Status: AC

## 2019-12-04 MED ORDER — SODIUM CHLORIDE 0.9 % IR SOLN
Status: DC | PRN
  Administered 2019-12-04: 3000 mL

## 2019-12-04 MED ORDER — ONDANSETRON HCL 4 MG/2ML IJ SOLN
4.0000 mg | Freq: Four times a day (QID) | INTRAMUSCULAR | Status: DC | PRN
  Administered 2019-12-05: 09:00:00 4 mg via INTRAVENOUS
  Filled 2019-12-04: qty 2

## 2019-12-04 MED ORDER — ONDANSETRON 4 MG PO TBDP
ORAL_TABLET | ORAL | Status: AC
  Filled 2019-12-04: qty 1

## 2019-12-04 MED ORDER — ALBUMIN HUMAN 5 % IV SOLN: INTRAVENOUS | Status: DC | PRN

## 2019-12-04 MED ORDER — FENTANYL CITRATE (PF) 100 MCG/2ML IJ SOLN
INTRAMUSCULAR | Status: AC
  Filled 2019-12-04: qty 2

## 2019-12-04 MED ORDER — HYDROMORPHONE HCL 1 MG/ML IJ SOLN
1.0000 mg | INTRAMUSCULAR | Status: DC | PRN
  Administered 2019-12-04 – 2019-12-05 (×4): 2 mg via INTRAVENOUS
  Filled 2019-12-04 (×6): qty 2

## 2019-12-04 MED ORDER — CYCLOBENZAPRINE HCL 5 MG PO TABS
5.0000 mg | ORAL_TABLET | Freq: Three times a day (TID) | ORAL | Status: DC
  Administered 2019-12-04 – 2019-12-05 (×3): 5 mg via ORAL
  Filled 2019-12-04 (×3): qty 1

## 2019-12-04 MED ORDER — HYDROMORPHONE HCL 1 MG/ML IJ SOLN
1.0000 mg | Freq: Once | INTRAMUSCULAR | Status: AC
  Administered 2019-12-04: 1 mg via INTRAVENOUS
  Filled 2019-12-04: qty 1

## 2019-12-04 MED ORDER — GABAPENTIN 100 MG PO CAPS
100.0000 mg | ORAL_CAPSULE | Freq: Three times a day (TID) | ORAL | Status: DC
  Administered 2019-12-04 – 2019-12-05 (×3): 100 mg via ORAL
  Filled 2019-12-04 (×3): qty 1

## 2019-12-04 MED ORDER — SODIUM CHLORIDE 0.9 % IV SOLN: INTRAVENOUS | Status: DC | PRN

## 2019-12-04 SURGICAL SUPPLY — 41 items
APPLICATOR ARISTA FLEXITIP XL (MISCELLANEOUS) IMPLANT
DERMABOND ADVANCED (GAUZE/BANDAGES/DRESSINGS) ×1
DERMABOND ADVANCED .7 DNX12 (GAUZE/BANDAGES/DRESSINGS) ×2 IMPLANT
DRSG OPSITE POSTOP 3X4 (GAUZE/BANDAGES/DRESSINGS) ×3 IMPLANT
DURAPREP 26ML APPLICATOR (WOUND CARE) ×3 IMPLANT
GLOVE BIO SURGEON STRL SZ7.5 (GLOVE) ×4 IMPLANT
GLOVE BIOGEL PI IND STRL 7.0 (GLOVE) ×4 IMPLANT
GLOVE BIOGEL PI IND STRL 8 (GLOVE) ×2 IMPLANT
GLOVE BIOGEL PI INDICATOR 7.0 (GLOVE) ×4
GLOVE BIOGEL PI INDICATOR 8 (GLOVE) ×1
GOWN STRL REUS W/ TWL LRG LVL3 (GOWN DISPOSABLE) ×2 IMPLANT
GOWN STRL REUS W/ TWL XL LVL3 (GOWN DISPOSABLE) ×2 IMPLANT
GOWN STRL REUS W/TWL LRG LVL3 (GOWN DISPOSABLE) ×1
GOWN STRL REUS W/TWL XL LVL3 (GOWN DISPOSABLE) ×1
HEMOSTAT ARISTA ABSORB 3G PWDR (HEMOSTASIS) IMPLANT
KIT TURNOVER KIT B (KITS) ×3 IMPLANT
NDL INSUFF ACCESS 14 VERSASTEP (NEEDLE) IMPLANT
NS IRRIG 1000ML POUR BTL (IV SOLUTION) ×3 IMPLANT
PACK LAPAROSCOPY BASIN (CUSTOM PROCEDURE TRAY) ×3 IMPLANT
PACK TRENDGUARD 450 HYBRID PRO (MISCELLANEOUS) ×2 IMPLANT
PACK TRENDGUARD 600 HYBRD PROC (MISCELLANEOUS) IMPLANT
POUCH SPECIMEN RETRIEVAL 10MM (ENDOMECHANICALS) ×1 IMPLANT
PROTECTOR NERVE ULNAR (MISCELLANEOUS) ×6 IMPLANT
SET IRRIG TUBING LAPAROSCOPIC (IRRIGATION / IRRIGATOR) ×1 IMPLANT
SET TUBE SMOKE EVAC HIGH FLOW (TUBING) ×3 IMPLANT
SHEARS HARMONIC ACE PLUS 36CM (ENDOMECHANICALS) IMPLANT
SLEEVE XCEL OPT CAN 5 100 (ENDOMECHANICALS) ×2 IMPLANT
SUT MNCRL AB 4-0 PS2 18 (SUTURE) ×4 IMPLANT
SUT VICRYL 0 UR6 27IN ABS (SUTURE) ×3 IMPLANT
SYSTEM CARTER THOMASON II (TROCAR) ×1 IMPLANT
TOWEL GREEN STERILE FF (TOWEL DISPOSABLE) ×6 IMPLANT
TRAY FOLEY W/BAG SLVR 14FR (SET/KITS/TRAYS/PACK) ×3 IMPLANT
TRENDGUARD 450 HYBRID PRO PACK (MISCELLANEOUS)
TRENDGUARD 600 HYBRID PROC PK (MISCELLANEOUS) ×3
TROCAR BALLN 12MMX100 BLUNT (TROCAR) ×2 IMPLANT
TROCAR BLADELESS 5MM (ENDOMECHANICALS) ×3 IMPLANT
TROCAR VERSASTEP PLUS 12MM (TROCAR) IMPLANT
TROCAR VERSASTEP PLUS 5MM (TROCAR) IMPLANT
TROCAR XCEL NON-BLD 11X100MML (ENDOMECHANICALS) ×1 IMPLANT
TROCAR XCEL NON-BLD 5MMX100MML (ENDOMECHANICALS) ×3 IMPLANT
WARMER LAPAROSCOPE (MISCELLANEOUS) ×3 IMPLANT

## 2019-12-04 NOTE — Op Note (Signed)
12/04/2019  5:25 PM  PATIENT:  Denise Buck  29 y.o. female  PRE-OPERATIVE DIAGNOSIS:  RUPTURED ECTOPIC HYPOTENSIVE  POST-OPERATIVE DIAGNOSIS:  Ruptured left ectopic, hypotensive  PROCEDURE:  Procedure(s): DIAGNOSTIC LAPAROSCOPY WITH REMOVAL OF ECTOPIC PREGNANCY (N/A)  SURGEON:  Surgeon(s) and Role:    * Cherre Blanc, MD - Primary    * Clarnce Flock, MD - Fellow   ASSISTANTS: none   ANESTHESIA:   epidural  EBL:  1850 mL   BLOOD ADMINISTERED: 2 units CC PRBC  DRAINS: Urinary Catheter (Foley)   LOCAL MEDICATIONS USED:  XYLOCAINE   SPECIMEN:  Source of Specimen:  left fallopian tube and left ecotopic  DISPOSITION OF SPECIMEN:  PATHOLOGY  COUNTS:  YES  TOURNIQUET:  * No tourniquets in log *  DICTATION: .Note written in paper chart  PLAN OF CARE: Admit for overnight observation  PATIENT DISPOSITION:  PACU - hemodynamically stable.   Delay start of Pharmacological VTE agent (>24hrs) due to surgical blood loss or risk of bleeding: yes  ANESTHESIOLOGY TEAM: Anesthesiologist: Suzette Battiest, MD CRNA: Griffin Dakin, CRNA  INDICATIONS: 29 y.o. [phone] at [redacted]w[redacted]d here with the preoperative diagnoses as listed above.  Please refer to preoperative notes for more details. Patient was counseled regarding need for laparoscopic salpingectomy. Risks of surgery including bleeding which may require transfusion or reoperation, infection, injury to bowel or other surrounding organs, need for additional procedures including laparotomy and other postoperative/anesthesia complications were explained to patient.  Written informed consent was obtained.  FINDINGS:  large amount of hemoperitoneum estimated to be about 2 liters of blood and clots.  Dilated left fallopian tube containing ectopic gestation. Small normal appearing uterus, normal right fallopian tube, right ovary and left ovary.  ANESTHESIA: General INTRAVENOUS FLUIDS: 1600 ml ESTIMATED BLOOD LOSS: 1850 ml  blood/clots removed, 10cc from acutal case URINE OUTPUT: 200 ml SPECIMENS: Left fallopian tube containing ectopic gestation COMPLICATIONS: None immediate  PROCEDURE IN DETAIL:  The patient was taken to the operating room where general anesthesia was administered and was found to be adequate.  She was placed in the dorsal lithotomy position, and was prepped and draped in a sterile manner.  A Foley catheter was inserted into her bladder and attached to constant drainage and a uterine manipulator was then advanced into the uterus .    After an adequate timeout was performed, attention was turned to the abdomen where an umbilical incision was made with the scalpel.  The Optiview 5-mm trocar and sleeve were then advanced without difficulty with the laparoscope under direct visualization into the abdomen.  The abdomen was then insufflated with carbon dioxide gas and adequate pneumoperitoneum was obtained.  A survey of the patient's pelvis and abdomen revealed the findings above.  A 5-mm  right lower quadrant port and 35mm left lower quadrant port were then placed under direct visualization.  The Nezhat suction irrigator was then used to suction the hemoperitoneum and irrigate the pelvis.  Attention was then turned to the left fallopian tube which was grasped and ligated from the underlying mesosalpinx and uterine attachment using the Ligasure instrument.  Good hemostasis was noted.  The specimen was placed in an EndoCatch bag and removed from the abdomen intact.  The abdomen was desufflated, and all instruments were removed.  The fascial incision of the 11-mm site was reapproximated with a 0 Vicryl using a Carter-Thompson device; and all skin incisions were closed with 4-0 Monocryl and Dermabond. The patient tolerated the procedure well.  Sponge, lap,  and needle counts were correct times three.  The patient was then taken to the recovery room awake, extubated and in stable condition.   She will follow up in the  office in about 2-3 weeks for postoperative evaluation.  Given the amount of blood loss, patient will be observed overnight in the hospital.  Will discharge in the morning if she remains stable.  Will recheck hemoglobin later tonight and ascertain need for further possible transfusions.  Raynelle Dick, MD Obstetrician & Gynecologist, [facility] for [employer], [facility]

## 2019-12-04 NOTE — Telephone Encounter (Signed)
Pt states that she has been bleeding for about a week. Now having cramping that started about 30 min ago. Pt states bleeding is heavier now and denies passing any tissue or clots. She was supposed to have ultrasound yesterday but didn't come. Advised patient that if pain is severe she can go to ER for evaluation. And that I would send message to Denise Buck to see what else patient needs to do. No other questions at this time.

## 2019-12-04 NOTE — Telephone Encounter (Signed)
Left message, that she can get [facility] checked today, but if in pain go to ER

## 2019-12-04 NOTE — Anesthesia Preprocedure Evaluation (Signed)
Anesthesia Evaluation  Patient identified by MRN, date of birth, ID band Patient awake    Reviewed: Allergy & Precautions, NPO status , Patient's Chart, lab work & pertinent test resultsPreop documentation limited or incomplete due to emergent nature of procedure.  Airway   TM Distance: >3 FB Neck ROM: Full    Dental  (+) Dental Advisory Given   Pulmonary former smoker,    breath sounds clear to auscultation       Cardiovascular  Rhythm:Regular Rate:Normal     Neuro/Psych    GI/Hepatic   Endo/Other    Renal/GU      Musculoskeletal   Abdominal   Peds  Hematology   Anesthesia Other Findings   Reproductive/Obstetrics                             Anesthesia Physical Anesthesia Plan  ASA: III and emergent  Anesthesia Plan: General   Post-op Pain Management:    Induction: Intravenous and Rapid sequence  PONV Risk Score and Plan: 3 and Dexamethasone, Ondansetron and Treatment may vary due to age or medical condition  Airway Management Planned: Oral ETT and Video Laryngoscope Planned  Additional Equipment: Arterial line  Intra-op Plan:   Post-operative Plan: Extubation in OR  Informed Consent: I have reviewed the patients History and Physical, chart, labs and discussed the procedure including the risks, benefits and alternatives for the proposed anesthesia with the patient or authorized representative who has indicated his/her understanding and acceptance.     Dental advisory given  Plan Discussed with: CRNA  Anesthesia Plan Comments:         Anesthesia Quick Evaluation

## 2019-12-04 NOTE — H&P (Signed)
See MAU provider note for H & P

## 2019-12-04 NOTE — Progress Notes (Signed)
Pt is screaming for pain, dilaudid IV and toradol IV given, pt states that pain is not going away despite the pain meds, called Dr. Mayford Knife to report and ordered cbc stat and ct scan of the abdomen and pelvis.

## 2019-12-04 NOTE — MAU Note (Signed)
Report was given to short stay nurse Ro, RN at [phone].

## 2019-12-04 NOTE — Anesthesia Procedure Notes (Signed)
Procedure Name: Intubation Date/Time: 12/04/2019 2:52 PM Performed by: Macie Burows, CRNA Pre-anesthesia Checklist: Patient identified, Emergency Drugs available, Suction available and Patient being monitored Patient Re-evaluated:Patient Re-evaluated prior to induction Oxygen Delivery Method: Circle system utilized Preoxygenation: Pre-oxygenation with 100% oxygen Induction Type: IV induction and Rapid sequence Laryngoscope Size: Glidescope and 4 Grade View: Grade I Tube type: Oral Tube size: 7.0 mm Number of attempts: 1 Airway Equipment and Method: Stylet and Oral airway Placement Confirmation: ETT inserted through vocal cords under direct vision,  positive ETCO2 and breath sounds checked- equal and bilateral Secured at: 24 cm Tube secured with: Tape Dental Injury: Teeth and Oropharynx as per pre-operative assessment

## 2019-12-04 NOTE — MAU Note (Addendum)
Was at work, gowned - setting up for an OR case.  Became hot, diaphoretic, dizzy and nauseated. Brought to unit via stretcher. Had thrown up this morning.  C/o sudden onset of lower abd pain. has been having light bleeding for a wk. Has been seen at [facility] for confirmation of preg.

## 2019-12-04 NOTE — H&P (Addendum)
Chief Complaint  Patient presents with  . Abdominal Pain  . Vaginal Bleeding  . Emesis  . Nausea  . Dizziness   Denise Buck is a 29 y.o. [phone] who presents to the MAU today for abdominal pain, vaginal bleeding, emesis, nausea, dizziness, and near syncope episode. 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 Upon arrival in the MAU she continued to vomit profusely. She claims that she has only eaten eggs this morning for breakfast. She reports the pain to be intense and suprapubic. Pain is constant but increases with movement and palpation.    Confirmed positivity pregnancy at [facility]. Vaginal bleeding has been present for a week. She reports the pregnancy is unwanted and planned to terminate so did not pursue care for the bleeding.   U/S confirmed probable ruptured ectopic pregnancy.     OB History    Gravida  5   Para  3   Term  2   Preterm  1   AB  1   Living  2     SAB  1   TAB  0   Ectopic  0   Multiple  0   Live Births  2           Past Medical History:  Diagnosis Date  . Chlamydia   . Medical history non-contributory     Past Surgical History:  Procedure Laterality Date  . CESAREAN SECTION    . CESAREAN SECTION N/A 06/11/2017   Procedure: REPEAT CESAREAN SECTION;  Surgeon: Jonnie Kind, MD;  Location: [place];  Service: Obstetrics;  Laterality: N/A;  . CHOLECYSTECTOMY    . DILATION AND CURETTAGE OF UTERUS N/A 11/16/2018   Procedure: SUCTION DILATATION AND CURETTAGE;  Surgeon: Florian Buff, MD;  Location: [facility];  Service: Gynecology;  Laterality: N/A;  . WISDOM TOOTH EXTRACTION      Family History  Problem Relation Age of Onset  . Autism Maternal Uncle   . Cancer Paternal Aunt        breast cancer  . Cancer Paternal Grandmother        breast cancer    Social History   Tobacco Use  . Smoking status: Former Smoker    Types:  Cigarettes  . Smokeless tobacco: Never Used  Substance Use Topics  . Alcohol use: No    Comment: occ; not now  . Drug use: No    Allergies:  Allergies  Allergen Reactions  . Hydrocodone Itching, Nausea And Vomiting and Rash    Medications Prior to Admission  Medication Sig Dispense Refill Last Dose  . prenatal vitamin w/FE, FA (PRENATAL 1 + 1) 27-1 MG TABS tablet Take 1 tablet by mouth daily at 12 noon. 30 tablet 12     Review of Systems Physical Exam Blood pressure 139/86, pulse 85, temperature 98.1 F (36.7 C), temperature source Oral, resp. rate 20, last menstrual period 11/01/2019, SpO2 100 %. Physical Exam  Vitals reviewed. Constitutional: Vital signs are normal. She appears ill.  Respiratory: Effort normal.  GI: There is abdominal tenderness.  Genitourinary:    Vaginal bleeding present.     No vaginal discharge.  There is bleeding in the vagina.  Neurological: She is alert.  Skin: Skin is warm. She is diaphoretic. There is pallor.  Psychiatric: Her speech is normal. Her mood appears anxious.    MAU Course/ MDM Procedures Due to first trimester  bleeding we preformed a CBC w/ diff., abdominal US, beta-hCG performed to confirm pregnancy, and AbO/Rh to confirm blood type. We also obtained a wet prep which was positive for few WBCs and G/C.   Abdominal US revealed free fluid present in the posterior cul-d-sac and dilated R fallopian tube.      A covid swab was performed and is negative   She was given medication for pain and emesis.   Meds ordered this encounter  Medications  . ketorolac (TORADOL) 30 MG/ML injection 30 mg  . DISCONTD: acetaminophen (TYLENOL) tablet 1,000 mg  . ondansetron (ZOFRAN-ODT) disintegrating tablet 4 mg  . ondansetron (ZOFRAN-ODT) 4 MG disintegrating tablet    Morris, Ginger   : cabinet override  . HYDROmorphone (DILAUDID) injection 1 mg  . lactated ringers infusion  . promethazine (PHENERGAN) injection 25 mg     A/P Probable  ruptured ectopic pregnancy.  Dx reviewed with pt. Surgerical management was recommended to pt. R/B/post op care reviewed with pt. Pt verbalized understanding. OR notified and aware. To OR   Laural Benes, MS3

## 2019-12-04 NOTE — Transfer of Care (Signed)
Immediate Anesthesia Transfer of Care Note  Patient: Denise Buck  Procedure(s) Performed: DIAGNOSTIC LAPAROSCOPY WITH REMOVAL OF ECTOPIC PREGNANCY (N/A Abdomen)  Patient Location: PACU  Anesthesia Type:General  Level of Consciousness: awake, alert  and oriented  Airway & Oxygen Therapy: Patient Spontanous Breathing  Post-op Assessment: Report given to RN and Post -op Vital signs reviewed and stable  Post vital signs: Reviewed and stable  Last Vitals:  Vitals Value Taken Time  BP 145/93 12/04/19 1700  Temp    Pulse 92 12/04/19 1706  Resp 17 12/04/19 1706  SpO2 100 % 12/04/19 1706  Vitals shown include unvalidated device data.  Last Pain:  Vitals:   12/04/19 1413  TempSrc:   PainSc: 8       Patients Stated Pain Goal: 3 (12/04/19 1413)  Complications: No apparent anesthesia complications

## 2019-12-04 NOTE — Anesthesia Procedure Notes (Signed)
Arterial Line Insertion Start/End4/15/2021 2:40 PM, 12/04/2019 2:50 PM Performed by: Marcene Duos, MD, anesthesiologist  Patient location: Pre-op. Preanesthetic checklist: patient identified, IV checked, site marked, risks and benefits discussed, surgical consent, monitors and equipment checked, pre-op evaluation, timeout performed and anesthesia consent Lidocaine 1% used for infiltration radial was placed Catheter size: 20 Fr Hand hygiene performed  and maximum sterile barriers used   Attempts: 3 Procedure performed without using ultrasound guided technique. Following insertion, dressing applied and Biopatch. Post procedure assessment: normal and unchanged  Patient tolerated the procedure well with no immediate complications.

## 2019-12-05 ENCOUNTER — Other Ambulatory Visit (HOSPITAL_COMMUNITY): Payer: Self-pay

## 2019-12-05 ENCOUNTER — Observation Stay (HOSPITAL_COMMUNITY): Payer: Medicaid Other

## 2019-12-05 DIAGNOSIS — O00102 Left tubal pregnancy without intrauterine pregnancy: Secondary | ICD-10-CM | POA: Diagnosis not present

## 2019-12-05 DIAGNOSIS — K661 Hemoperitoneum: Secondary | ICD-10-CM

## 2019-12-05 DIAGNOSIS — T8189XA Other complications of procedures, not elsewhere classified, initial encounter: Secondary | ICD-10-CM | POA: Diagnosis not present

## 2019-12-05 LAB — POCT I-STAT 7, (LYTES, BLD GAS, ICA,H+H)
Acid-base deficit: 5 mmol/L — ABNORMAL HIGH (ref 0.0–2.0)
Acid-base deficit: 8 mmol/L — ABNORMAL HIGH (ref 0.0–2.0)
Bicarbonate: 22.6 mmol/L (ref 20.0–28.0)
Bicarbonate: 19.4 mmol/L — ABNORMAL LOW (ref 20.0–28.0)
Calcium, Ion: 1.18 mmol/L (ref 1.15–1.40)
Calcium, Ion: 1.15 mmol/L (ref 1.15–1.40)
HCT: 25 % — ABNORMAL LOW (ref 36.0–46.0)
HCT: 31 % — ABNORMAL LOW (ref 36.0–46.0)
Hemoglobin: 8.5 g/dL — ABNORMAL LOW (ref 12.0–15.0)
Hemoglobin: 10.5 g/dL — ABNORMAL LOW (ref 12.0–15.0)
O2 Saturation: 100 %
O2 Saturation: 87 %
Potassium: 4.6 mmol/L (ref 3.5–5.1)
Potassium: 5 mmol/L (ref 3.5–5.1)
Sodium: 138 mmol/L (ref 135–145)
Sodium: 138 mmol/L (ref 135–145)
TCO2: 24 mmol/L (ref 22–32)
TCO2: 21 mmol/L — ABNORMAL LOW (ref 22–32)
pCO2 arterial: 51.9 mmHg — ABNORMAL HIGH (ref 32.0–48.0)
pCO2 arterial: 48.8 mmHg — ABNORMAL HIGH (ref 32.0–48.0)
pH, Arterial: 7.248 — ABNORMAL LOW (ref 7.350–7.450)
pH, Arterial: 7.208 — ABNORMAL LOW (ref 7.350–7.450)
pO2, Arterial: 323 mmHg — ABNORMAL HIGH (ref 83.0–108.0)
pO2, Arterial: 65 mmHg — ABNORMAL LOW (ref 83.0–108.0)

## 2019-12-05 LAB — CBC
HCT: 28.7 % — ABNORMAL LOW (ref 36.0–46.0)
Hemoglobin: 9 g/dL — ABNORMAL LOW (ref 12.0–15.0)
MCH: 27.4 pg (ref 26.0–34.0)
MCHC: 31.4 g/dL (ref 30.0–36.0)
MCV: 87.2 fL (ref 80.0–100.0)
Platelets: 223 10*3/uL (ref 150–400)
RBC: 3.29 MIL/uL — ABNORMAL LOW (ref 3.87–5.11)
RDW: 14.9 % (ref 11.5–15.5)
WBC: 16.2 10*3/uL — ABNORMAL HIGH (ref 4.0–10.5)
nRBC: 0 % (ref 0.0–0.2)

## 2019-12-05 LAB — GC/CHLAMYDIA PROBE AMP (~~LOC~~) NOT AT ARMC
Chlamydia: NEGATIVE
Comment: NEGATIVE
Comment: NORMAL
Neisseria Gonorrhea: POSITIVE — AB

## 2019-12-05 LAB — SURGICAL PATHOLOGY

## 2019-12-05 MED ORDER — IOHEXOL 300 MG/ML  SOLN
100.0000 mL | Freq: Once | INTRAMUSCULAR | Status: AC | PRN
  Administered 2019-12-05: 100 mL via INTRAVENOUS

## 2019-12-05 MED ORDER — IBUPROFEN 800 MG PO TABS: 800.0000 mg | ORAL_TABLET | Freq: Three times a day (TID) | ORAL | 1 refills | Status: DC

## 2019-12-05 MED ORDER — HYDROMORPHONE HCL 2 MG PO TABS: 2.0000 mg | ORAL_TABLET | ORAL | 0 refills | Status: DC | PRN

## 2019-12-05 NOTE — Final Progress Note (Signed)
PIV to be removed closer to discharge time. AVS discussed with patient, all questions and concerns answered. Discharge home self care. Patient verbalized restrictions including no driving for 7 days or while taking pain medications. Discharge not within the 2 hour mark due to patient awaiting ride home.

## 2019-12-05 NOTE — Progress Notes (Signed)
RN noticed RLQ port/lap site now has a large ecchymotic area that was not present yesterday. GYN on-call called. Per MD no concern with bruising in that area.

## 2019-12-05 NOTE — Plan of Care (Signed)
  Problem: Education: Goal: Knowledge of General Education information will improve Description: Including pain rating scale, medication(s)/side effects and non-pharmacologic comfort measures Outcome: Progressing   Problem: Health Behavior/Discharge Planning: Goal: Ability to manage health-related needs will improve Outcome: Progressing   Problem: Clinical Measurements: Goal: Ability to maintain clinical measurements within normal limits will improve Outcome: Progressing   Problem: Activity: Goal: Risk for activity intolerance will decrease Outcome: Progressing   Problem: Nutrition: Goal: Adequate nutrition will be maintained Outcome: Progressing   Problem: Coping: Goal: Level of anxiety will decrease Outcome: Progressing   Problem: Elimination: Goal: Will not experience complications related to bowel motility Outcome: Progressing Goal: Will not experience complications related to urinary retention Outcome: Progressing   Problem: Safety: Goal: Ability to remain free from injury will improve Outcome: Progressing

## 2019-12-05 NOTE — Discharge Instructions (Signed)
Ruptured Ectopic Pregnancy  An ectopic pregnancy is when a fertilized egg attaches (implants) outside of the uterus, usually in a fallopian tube. A ruptured ectopic pregnancy is when the fallopian tube tears or bursts. This results in internal bleeding, intense abdominal pain, and sometimes, vaginal bleeding. Most ectopic pregnancies occur in the fallopian tube. In rare cases, it may occur on the ovary, intestine, pelvis, or cervix. An ectopic pregnancy does not have the ability to develop into a normal, healthy baby. A ruptured ectopic pregnancy can affect your ability to have children (fertility), depending on damage it causes to your reproductive organs. Ruptured ectopic pregnancy is a medical emergency. If not treated immediately, it can lead to blood loss, shock, or even death. What are the causes? Most ectopic pregnancies are caused by damage to the fallopian tubes. The damage prevents the fertilized egg from implanting in the uterus. In some cases, the cause may not be known. What increases the risk? You are at increased risk for an ectopic pregnancy if:  You have had a previous ectopic pregnancy.  You have had previous fallopian tube surgery.  You have had previous surgery to have the fallopian tubes tied (tubal ligation).  You have had infertility treatments or have a history of infertility.  You have been exposed to DES. DES is a medicine that was used until 1971 and had effects on babies whose mothers took the medicine.  You use an IUD (intrauterine device) for birth control.  You use progestin-only oral contraception for birth control.  You have a history of pelvic inflammatory disease (PID).  You have a history of endometriosis.  You smoke.  You became sexually active before 29 years of age.  You have multiple sexual partners. What are the signs or symptoms? Symptoms of a ruptured ectopic pregnancy and internal bleeding may include:  Sudden, severe pain in the abdomen  and pelvis.  Dizziness or fainting.  Pain in the shoulder area.  Vaginal bleeding. How is this diagnosed? This condition is diagnosed based on your medical history, symptoms, a physical exam, and tests, which may include:  A pregnancy test.  An ultrasound.  Measuring the levels of the pregnancy hormone in the bloodstream.  Taking a sample of tissue from the uterus (dilation and curettage, D&C).  Surgery to visually examine the inside of the abdomen using a lighted tube (laparoscopy). How is this treated? This condition is treated with IV fluids and emergency surgery to remove the ectopic pregnancy and repair the area where the rupture occured. If you have lost a lot of blood, you may need a blood transfusion. If you are Rh negative and your baby's father is Rh positive, or the Rh type of the father is unknown, you may receive a Rho (D) immune globulin shot. This is to prevent Rh problems in future pregnancies. Additional medicines may be given. Get help right away if:  You are taking medicines to treat an ectopic pregnancy and you develop symptoms of a rupture. These include: ? Fever or chills. ? Shoulder pain. ? Vaginal bleeding. ? Nausea and vomiting. ? Severe abdominal pain or cramping. ? Feeling light-headed or fainting. Summary  An ectopic pregnancy is when a fertilized egg attaches (implants) outside of the uterus, usually in a fallopian tube. A ruptured ectopic pregnancy is when the fallopian tube tears or bursts.  Ruptured ectopic pregnancy is a medical emergency. If not treated immediately, it can lead to blood loss, shock, or even death.  This condition is treated with   IV fluids and emergency surgery to remove the ectopic pregnancy and repair the area where the rupture occured. If you have lost a lot of blood, you may need a blood transfusion. This information is not intended to replace advice given to you by your health care provider. Make sure you discuss any  questions you have with your health care provider. Document Revised: 07/20/2017 Document Reviewed: 10/25/2016 Elsevier Patient Education  2020 [employer]. Ectopic Pregnancy  An ectopic pregnancy happens when a fertilized egg grows outside the womb (uterus). The fertilized egg cannot stay alive outside of the womb. This problem often happens in a fallopian tube. It is often caused by damage to the tube. If this problem is found early, you may be treated with medicine that stops the egg from growing. If your tube tears or bursts open (ruptures), you will bleed inside. Often, there is very bad pain in the lower belly. This is an emergency. You will need surgery. Get help right away. Follow these instructions at home: After being treated with medicine or surgery:  Rest and limit your activity for as long as told by your doctor.  Until your doctor says that it is safe: ? Do not lift anything that is heavier than 10 lb (4.5 kg) or the limit that your doctor tells you. ? Avoid exercise and any movement that takes a lot of effort.  To prevent problems when pooping (constipation): ? Eat a healthy diet. This includes:  Fruits.  Vegetables.  Whole grains. ? Drink 6-8 glasses of water a day. Contact a doctor if: Get help right away if:  You have sudden and very bad pain in your belly.  You have very bad pain in your shoulders or neck.  You have pain that gets worse and is not helped by medicine.  You have: ? A fever or chills. ? Vaginal bleeding. ? Redness or swelling at the site of a surgical cut (incision).  You feel sick to your stomach (nauseous) or you throw up (vomit).  You feel dizzy or weak.  You feel light-headed or you pass out (faint). Summary  An ectopic pregnancy happens when a fertilized egg grows outside the womb (uterus).  If this problem is found early, you may be treated with medicine that stops the egg from growing.  If your tube tears or bursts open  (ruptures), you will need surgery. This is an emergency. Get help right away. This information is not intended to replace advice given to you by your health care provider. Make sure you discuss any questions you have with your health care provider. Document Revised: 07/20/2017 Document Reviewed: 08/31/2016 Elsevier Patient Education  2020 [employer] Laparoscopic Tubal Removal for Ectopic Pregnancy Discharge Instructions Laparoscopic tubal removal is a procedure that removes the fallopian tube containing the ectopic pregnancy. RISKS AND COMPLICATIONS   Infection.  Bleeding.  Injury to surrounding organs.  Anesthetic side effects.  Failure of the procedure.  Risks of future ectopic pregnancy on the other side PROCEDURE   You may be given a medicine to help you relax (sedative) before the procedure. You will be given a medicine to make you sleep (general anesthetic) during the procedure.  A tube will be put down your throat to help your breath while under general anesthesia.  Two small cuts (incisions) are made in the lower abdominal area and one incision is made near the belly button.  Your abdominal area will be inflated with a safe gas (carbon dioxide). This helps  give the surgeon room to operate, visualize, and helps the surgeon avoid other organs.  A thin, lighted tube (laparoscope) with a camera attached is inserted into your abdomen through the incision near the belly button. Other small instruments are also inserted through the other abdominal incisions.  The fallopian tube is located and are removed.  After the fallopian tube is removed, the gas is released from the abdomen.  The incisions will be closed with stitches (sutures), and Dermabond. A bandage may be placed over the incisions. AFTER THE PROCEDURE   You will also have some mild abdominal discomfort for 3-7 days. You will be given pain medicine to ease any discomfort.  As long as there are no problems, you  may be allowed to go home. Someone will need to drive you home and be with you for at least 24 hours once home.  You may have some mild discomfort in the throat. This is from the tube placed in your throat while you were sleeping.  You may experience discomfort in the shoulder area from some trapped air between the liver and diaphragm. This sensation is normal and will slowly go away on its own. HOME CARE INSTRUCTIONS   Take all medicines as directed.  Only take over-the-counter or prescription medicines for pain, discomfort, or fever as directed by your caregiver.  Resume daily activities as directed.  Showers are preferred over baths.  You may resume sexual activities in 1 week or as directed.  Do not drive while taking narcotics. SEEK MEDICAL CARE IF: .  There is increasing abdominal pain.  You feel lightheaded or faint.  You have the chills.  You have an oral temperature above 102 F (38.9 C).  There is pus-like (purulent) drainage from any of the wounds.  You are unable to pass gas or have a bowel movement.  You feel sick to your stomach (nauseous) or throw up (vomit). MAKE SURE YOU:   Understand these instructions.  Will watch your condition.  Will get help right away if you are not doing well or get worse.  ExitCare Patient Information 2013 [place].

## 2019-12-05 NOTE — Discharge Summary (Signed)
Physician Discharge Summary  Patient ID: Denise Buck MRN: [id number] DOB/AGE: 17-Dec-1990 29 y.o.  Admit date: 12/04/2019 Discharge date: 12/05/2019  Admission Diagnoses: Rupture left ectopic pregnancy with hemoperitoneum  Discharge Diagnoses: SAA   Discharged Condition: good  Hospital Course: [name] was admitted with above Dx. See admitting H & P for additional information. She underwent Laparoscopy with evacuation of hemoperitoneum and left salpingectomy. See Op note for additional information. She was transfused with 2 units of PRBC. Post OP CT scan was ordered d.t to post op pain. SQ hematoma in the area of the right 5 mm port was noted.  She progressed to ambulating, voiding, tolerating diet and good oral pain control. Felt amendable for discharge home. Discharge instructions, medications and follow up were reviewed with pt. Pt verbalized understanding.  Consults: None  Significant Diagnostic Studies: labs, U/S and CT scan  Treatments: surgery: Lap with left salpingectomy  Discharge Exam: Blood pressure (!) 123/50, pulse 71, temperature (!) 97.4 F (36.3 C), temperature source Oral, resp. rate 16, height 5\' 7"  (1.702 m), weight (!) 138.3 kg, last menstrual period 11/01/2019, SpO2 100 %.  Lungs clear Heart RRR Abd soft + BS ecchymosis RLQ, incisions C/D/I Ext non tender  Disposition: Discharge disposition: 01-Home or Self Care       Discharge Instructions    Call MD for:  difficulty breathing, headache or visual disturbances   Complete by: As directed    Call MD for:  extreme fatigue   Complete by: As directed    Call MD for:  hives   Complete by: As directed    Call MD for:  persistant dizziness or light-headedness   Complete by: As directed    Call MD for:  persistant nausea and vomiting   Complete by: As directed    Call MD for:  redness, tenderness, or signs of infection (pain, swelling, redness, odor or green/yellow discharge around incision site)    Complete by: As directed    Call MD for:  severe uncontrolled pain   Complete by: As directed    Call MD for:  temperature >100.4   Complete by: As directed    Diet - low sodium heart healthy   Complete by: As directed    Driving Restrictions   Complete by: As directed    No for 7 days   Increase activity slowly   Complete by: As directed    Sexual Activity Restrictions   Complete by: As directed    Pelvic rest x 2 weeks     Allergies as of 12/05/2019      Reactions   Hydrocodone Itching, Nausea And Vomiting, Rash      Medication List    TAKE these medications   HYDROmorphone 2 MG tablet Commonly known as: DILAUDID Take 1 tablet (2 mg total) by mouth every 3 (three) hours as needed for severe pain ((when tolerating fluids)).   ibuprofen 800 MG tablet Commonly known as: ADVIL Take 1 tablet (800 mg total) by mouth 3 (three) times daily.   prenatal vitamin w/FE, FA 27-1 MG Tabs tablet Take 1 tablet by mouth daily at 12 noon.      Follow-up Information    [facility]. Schedule an appointment as soon as possible for a visit in 2 week(s).   Specialty: Obstetrics and Gynecology Why: Post Op appt Contact information: [address] [place] [phone]          Signed: [phone] 12/05/2019, 12:13  PM

## 2019-12-05 NOTE — Anesthesia Postprocedure Evaluation (Signed)
Anesthesia Post Note  Patient: Nekia Maxham  Procedure(s) Performed: DIAGNOSTIC LAPAROSCOPY WITH REMOVAL OF ECTOPIC PREGNANCY (N/A Abdomen) LAPAROSCOPIC LEFT SALPINGECTOMY (Left Abdomen)     Patient location during evaluation: PACU Anesthesia Type: General Level of consciousness: awake and alert Pain management: pain level controlled Vital Signs Assessment: post-procedure vital signs reviewed and stable Respiratory status: spontaneous breathing, nonlabored ventilation, respiratory function stable and patient connected to nasal cannula oxygen Cardiovascular status: blood pressure returned to baseline and stable Postop Assessment: no apparent nausea or vomiting Anesthetic complications: no    Last Vitals:  Vitals:   12/05/19 0755 12/05/19 1309  BP:  (!) 123/53  Pulse:  (!) 101  Resp: 16 16  Temp:  37.2 C  SpO2:  100%    Last Pain:  Vitals:   12/05/19 1309  TempSrc: Oral  PainSc:                  Kennieth Rad

## 2019-12-05 NOTE — Plan of Care (Signed)
  Problem: Education: Goal: Knowledge of General Education information will improve Description: Including pain rating scale, medication(s)/side effects and non-pharmacologic comfort measures 12/05/2019 1329 by Laurence Slate, RN Outcome: Adequate for Discharge 12/05/2019 0836 by Laurence Slate, RN Outcome: Progressing   Problem: Health Behavior/Discharge Planning: Goal: Ability to manage health-related needs will improve 12/05/2019 1329 by Laurence Slate, RN Outcome: Adequate for Discharge 12/05/2019 0836 by Laurence Slate, RN Outcome: Progressing   Problem: Clinical Measurements: Goal: Ability to maintain clinical measurements within normal limits will improve 12/05/2019 1329 by Laurence Slate, RN Outcome: Adequate for Discharge 12/05/2019 0836 by Laurence Slate, RN Outcome: Progressing Goal: Will remain free from infection Outcome: Adequate for Discharge Goal: Diagnostic test results will improve Outcome: Adequate for Discharge Goal: Respiratory complications will improve Outcome: Adequate for Discharge Goal: Cardiovascular complication will be avoided Outcome: Adequate for Discharge   Problem: Activity: Goal: Risk for activity intolerance will decrease 12/05/2019 1329 by Laurence Slate, RN Outcome: Adequate for Discharge 12/05/2019 0836 by Laurence Slate, RN Outcome: Progressing   Problem: Nutrition: Goal: Adequate nutrition will be maintained 12/05/2019 1329 by Laurence Slate, RN Outcome: Adequate for Discharge 12/05/2019 0836 by Laurence Slate, RN Outcome: Progressing   Problem: Coping: Goal: Level of anxiety will decrease 12/05/2019 1329 by Laurence Slate, RN Outcome: Adequate for Discharge 12/05/2019 0836 by Laurence Slate, RN Outcome: Progressing   Problem: Elimination: Goal: Will not experience complications related to bowel motility 12/05/2019 1329 by Laurence Slate, RN Outcome: Adequate for Discharge 12/05/2019 0836 by Laurence Slate, RN Outcome:  Progressing Goal: Will not experience complications related to urinary retention 12/05/2019 1329 by Laurence Slate, RN Outcome: Adequate for Discharge 12/05/2019 0836 by Laurence Slate, RN Outcome: Progressing   Problem: Pain Managment: Goal: General experience of comfort will improve 12/05/2019 1329 by Laurence Slate, RN Outcome: Adequate for Discharge 12/05/2019 0836 by Laurence Slate, RN Outcome: Not Progressing Note: Pt continues to report increased pain even after IV pain prns added and administered   Problem: Safety: Goal: Ability to remain free from injury will improve 12/05/2019 1329 by Laurence Slate, RN Outcome: Adequate for Discharge 12/05/2019 0836 by Laurence Slate, RN Outcome: Progressing   Problem: Skin Integrity: Goal: Risk for impaired skin integrity will decrease Outcome: Adequate for Discharge

## 2019-12-06 LAB — BPAM RBC
Blood Product Expiration Date: 202105172359
Blood Product Expiration Date: 202105172359
Blood Product Expiration Date: 202105172359
Blood Product Expiration Date: 202105182359
ISSUE DATE / TIME: 202104151419
ISSUE DATE / TIME: 202104151419
Unit Type and Rh: 5100
Unit Type and Rh: 5100
Unit Type and Rh: 5100
Unit Type and Rh: 5100

## 2019-12-06 LAB — TYPE AND SCREEN
ABO/RH(D): O POS
Antibody Screen: NEGATIVE
Unit division: 0
Unit division: 0
Unit division: 0
Unit division: 0

## 2019-12-09 ENCOUNTER — Encounter: Payer: Self-pay | Admitting: *Deleted

## 2019-12-09 ENCOUNTER — Telehealth: Payer: Self-pay | Admitting: Obstetrics & Gynecology

## 2019-12-09 NOTE — Telephone Encounter (Signed)
Patient called, requesting a shot for STD, please advise.  She also has a post-op on 12/22/19.  She wants to know if she stays out of work until then.  [phone]

## 2019-12-10 ENCOUNTER — Other Ambulatory Visit: Payer: Self-pay

## 2019-12-10 ENCOUNTER — Ambulatory Visit (INDEPENDENT_AMBULATORY_CARE_PROVIDER_SITE_OTHER): Payer: Medicaid Other | Admitting: *Deleted

## 2019-12-10 ENCOUNTER — Encounter: Payer: Self-pay | Admitting: *Deleted

## 2019-12-10 VITALS — BP 112/88 | HR 80

## 2019-12-10 DIAGNOSIS — A549 Gonococcal infection, unspecified: Secondary | ICD-10-CM

## 2019-12-10 MED ORDER — CEFTRIAXONE SODIUM 500 MG IJ SOLR
500.0000 mg | Freq: Once | INTRAMUSCULAR | Status: AC
  Administered 2019-12-10: 500 mg via INTRAMUSCULAR

## 2019-12-10 MED ORDER — CEFTRIAXONE SODIUM 250 MG IJ SOLR: 500.0000 mg | Freq: Once | INTRAMUSCULAR | Status: DC

## 2019-12-10 NOTE — Progress Notes (Signed)
   NURSE VISIT- INJECTION  SUBJECTIVE:  Denise Buck is a 29 y.o. [phone] female here for a Rocephin for treatment for gonorrhea. She is a GYN patient.   OBJECTIVE:  Breastfeeding Unknown   Appears well, in no apparent distress  Injection administered in: Right upper quad. gluteus  Meds ordered this encounter  Medications  . cefTRIAXone (ROCEPHIN) injection 500 mg  . cefTRIAXone (ROCEPHIN) injection 500 mg    ASSESSMENT: GYN patient Rocephin for treatment for gonorrhea.Observed for 15 minutes with no complications.  PLAN: Follow-up: 3 weeks for POC  Viet Kemmerer  12/10/2019 2:42 PM

## 2019-12-12 ENCOUNTER — Other Ambulatory Visit: Payer: Self-pay

## 2019-12-12 ENCOUNTER — Encounter: Payer: Self-pay | Admitting: Obstetrics & Gynecology

## 2019-12-12 ENCOUNTER — Ambulatory Visit (INDEPENDENT_AMBULATORY_CARE_PROVIDER_SITE_OTHER): Payer: Medicaid Other | Admitting: Obstetrics & Gynecology

## 2019-12-12 VITALS — BP 131/83 | HR 76 | Ht 67.0 in

## 2019-12-12 DIAGNOSIS — Z9889 Other specified postprocedural states: Secondary | ICD-10-CM

## 2019-12-12 NOTE — Progress Notes (Signed)
  HPI: Patient returns for routine postoperative follow-up having undergone laparosocpic management of ruptured ectopic pregnancy on 12/04/19.  The patient's immediate postoperative recovery has been unremarkable. Since hospital discharge the patient reports doing well, brusing.   Current Outpatient Medications: .  ibuprofen (ADVIL) 800 MG tablet, Take 1 tablet (800 mg total) by mouth 3 (three) times daily., Disp: 30 tablet, Rfl: 1 .  HYDROmorphone (DILAUDID) 2 MG tablet, Take 1 tablet (2 mg total) by mouth every 3 (three) hours as needed for severe pain ((when tolerating fluids)). (Patient not taking: Reported on 12/12/2019), Disp: 24 tablet, Rfl: 0 .  prenatal vitamin w/FE, FA (PRENATAL 1 + 1) 27-1 MG TABS tablet, Take 1 tablet by mouth daily at 12 noon. (Patient not taking: Reported on 12/05/2019), Disp: 30 tablet, Rfl: 12  Current Facility-Administered Medications: .  cefTRIAXone (ROCEPHIN) injection 500 mg, 500 mg, Intramuscular, Once, Cyril Mourning A, NP     Blood pressure 131/83, pulse 76, height 5\' 7"  (1.702 m), unknown if currently breastfeeding.  Physical Exam: Significant bruising of the abdominal skin All incisions look good  Diagnostic Tests:   Pathology: Ectopic pregnancy  Impression: S/p laparoscopic management of a ruptured ectopic pregnancy  Plan: Wants Mirena IUD, ok to do next week  Follow up: 4/28 or 4/29 for mirena IUD    5/29, MD

## 2019-12-17 ENCOUNTER — Ambulatory Visit (INDEPENDENT_AMBULATORY_CARE_PROVIDER_SITE_OTHER): Payer: Medicaid Other | Admitting: Advanced Practice Midwife

## 2019-12-17 ENCOUNTER — Encounter: Payer: Self-pay | Admitting: Advanced Practice Midwife

## 2019-12-17 ENCOUNTER — Other Ambulatory Visit: Payer: Self-pay

## 2019-12-17 VITALS — BP 132/68 | HR 98 | Ht 67.0 in | Wt 302.0 lb

## 2019-12-17 DIAGNOSIS — A549 Gonococcal infection, unspecified: Secondary | ICD-10-CM | POA: Insufficient documentation

## 2019-12-17 DIAGNOSIS — Z30430 Encounter for insertion of intrauterine contraceptive device: Secondary | ICD-10-CM | POA: Diagnosis not present

## 2019-12-17 DIAGNOSIS — Z3202 Encounter for pregnancy test, result negative: Secondary | ICD-10-CM

## 2019-12-17 LAB — POCT URINE PREGNANCY: Preg Test, Ur: NEGATIVE

## 2019-12-17 MED ORDER — FERROUS FUMARATE 324 (106 FE) MG PO TABS: 1.0000 | ORAL_TABLET | Freq: Every day | ORAL | 3 refills | Status: DC

## 2019-12-17 MED ORDER — LEVONORGESTREL 20 MCG/24HR IU IUD: INTRAUTERINE_SYSTEM | Freq: Once | INTRAUTERINE | Status: AC

## 2019-12-17 NOTE — Progress Notes (Signed)
   IUD INSERTION Patient name: Denise Buck MRN [id number]  Date of birth: 1991/05/02 Subjective Findings:   Denise Buck is a 29 y.o. [phone] Caucasian female being seen today for insertion of a Mirena IUD. She is s/p L salpingectomy on 12/04/19 for ruptured ectopic with EBL 1850cc and 2u PRBC tx. Reports feeling slightly dizzy at work lately.  Depression screen [facility] 2/9 10/04/2017 11/20/2016  Decreased Interest 0 0  Down, Depressed, Hopeless 0 0  PHQ - 2 Score 0 0  Altered sleeping - 0  Tired, decreased energy - 0  Change in appetite - 3  Feeling bad or failure about yourself  - 0  Trouble concentrating - 0  Moving slowly or fidgety/restless - 0  Suicidal thoughts - 0  PHQ-9 Score - 3    No LMP recorded. Last sexual intercourse was >2wks Last pap Feb 2019. Results were:  normal  The risks and benefits of the method and placement have been thouroughly reviewed with the patient and all questions were answered.  Specifically the patient is aware of failure rate of 08/998, expulsion of the IUD and of possible perforation.  The patient is aware of irregular bleeding due to the method and understands the incidence of irregular bleeding diminishes with time.  Signed copy of informed consent in chart.  Pertinent History Reviewed:   Reviewed past medical,surgical, social, obstetrical and family history.  Reviewed problem list, medications and allergies. Objective Findings & Procedure:   Vitals:   12/17/19 1037  BP: 132/68  Pulse: 98  Weight: (!) 302 lb (137 kg)  Height: 5\' 7"  (1.702 m)  Body mass index is 47.3 kg/m.  Results for orders placed or performed in visit on 12/17/19 (from the past 24 hour(s))  POCT urine pregnancy   Collection Time: 12/17/19 10:46 AM  Result Value Ref Range   Preg Test, Ur Negative Negative     Time out was performed.  A Graves speculum was placed in the vagina.  The cervix was visualized, prepped using Betadine, and grasped with a single tooth  tenaculum. The uterus was found to be anteroflexed and it sounded to 8 cm.  Mirena  IUD placed per manufacturer's recommendations. The strings were trimmed to approximately 3 cm. The patient tolerated the procedure well.   Informal transvaginal sonogram was performed and the proper placement of the IUD was verified.  Chaperone: 12/19/19 CNM   Assessment & Plan:   1) Mirena IUD insertion The patient was given post procedure instructions, including signs and symptoms of infection and to check for the strings after each menses or each month, and refraining from intercourse or anything in the vagina for 3 days. She was given a care card with date IUD placed, and date IUD to be removed. She is scheduled for a f/u appointment in 4 weeks.  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 POCT urine pregnancy    Return in about 4 weeks (around 01/14/2020) for in person; f/u IUD; POC for GC.  01/16/2020 CNM 12/17/2019 12:43 PM

## 2019-12-17 NOTE — Patient Instructions (Signed)

## 2019-12-18 ENCOUNTER — Encounter: Payer: Self-pay | Admitting: *Deleted

## 2019-12-22 ENCOUNTER — Encounter: Payer: Medicaid Other | Admitting: Obstetrics & Gynecology

## 2020-01-02 ENCOUNTER — Other Ambulatory Visit: Payer: Self-pay

## 2020-01-14 ENCOUNTER — Ambulatory Visit: Payer: Medicaid Other | Admitting: Women's Health

## 2020-02-04 ENCOUNTER — Ambulatory Visit: Payer: Medicaid Other | Admitting: Obstetrics and Gynecology

## 2020-04-21 ENCOUNTER — Ambulatory Visit: Payer: Medicaid Other | Admitting: Adult Health

## 2020-04-28 ENCOUNTER — Ambulatory Visit: Payer: Medicaid Other | Admitting: Adult Health

## 2020-06-02 ENCOUNTER — Ambulatory Visit: Payer: Medicaid Other | Admitting: Adult Health

## 2020-06-07 ENCOUNTER — Ambulatory Visit
Admission: EM | Admit: 2020-06-07 | Discharge: 2020-06-07 | Disposition: A | Discharge status: Home / Self Care | Payer: Medicaid Other | Attending: Emergency Medicine | Admitting: Emergency Medicine

## 2020-06-07 ENCOUNTER — Other Ambulatory Visit: Payer: Self-pay

## 2020-06-07 ENCOUNTER — Encounter: Payer: Self-pay | Admitting: Emergency Medicine

## 2020-06-07 DIAGNOSIS — M545 Low back pain, unspecified: Secondary | ICD-10-CM | POA: Diagnosis not present

## 2020-06-07 MED ORDER — PREDNISONE 10 MG PO TABS: 20.0000 mg | ORAL_TABLET | Freq: Every day | ORAL | 0 refills | Status: DC

## 2020-06-07 MED ORDER — CYCLOBENZAPRINE HCL 5 MG PO TABS: 5.0000 mg | ORAL_TABLET | Freq: Three times a day (TID) | ORAL | 0 refills | Status: DC | PRN

## 2020-06-07 NOTE — ED Provider Notes (Addendum)
[facility]   [id number] 06/07/20 Arrival Time: 0919   Chief Complaint  Patient presents with  . Back Pain      SUBJECTIVE: History from: patient.  Denise Buck is a 29 y.o. female who presented to the urgent care for complaint of low back pain that started yesterday.  States she went out  this past weekend and was drunk and could not recall if she fell.  She localizes the pain to the middle low back.  She describes the pain as constant and achy.  She has tried OTC medications without relief.  Her symptoms are made worse with ROM.  She denies similar symptoms in the past.  Denies chills, fever, nausea, vomiting, diarrhea.  ROS: As per HPI.  All other pertinent ROS negative.     Past Medical History:  Diagnosis Date  . Chlamydia   . Medical history non-contributory    Past Surgical History:  Procedure Laterality Date  . CESAREAN SECTION    . CESAREAN SECTION N/A 06/11/2017   Procedure: REPEAT CESAREAN SECTION;  Surgeon: Tilda Burrow, MD;  Location: [facility];  Service: Obstetrics;  Laterality: N/A;  . CHOLECYSTECTOMY    . DIAGNOSTIC LAPAROSCOPY WITH REMOVAL OF ECTOPIC PREGNANCY N/A 12/04/2019   Procedure: DIAGNOSTIC LAPAROSCOPY WITH REMOVAL OF ECTOPIC PREGNANCY;  Surgeon: Malachy Chamber, MD;  Location: [facility] OR;  Service: Gynecology;  Laterality: N/A;  . DILATION AND CURETTAGE OF UTERUS N/A 11/16/2018   Procedure: SUCTION DILATATION AND CURETTAGE;  Surgeon: Lazaro Arms, MD;  Location: [facility] ORS;  Service: Gynecology;  Laterality: N/A;  . LAPAROSCOPIC UNILATERAL SALPINGECTOMY Left 12/04/2019   Procedure: LAPAROSCOPIC LEFT SALPINGECTOMY;  Surgeon: Malachy Chamber, MD;  Location[facility] OR;  Service: Gynecology;  Laterality: Left;  . WISDOM TOOTH EXTRACTION     Allergies  Allergen Reactions  . Hydrocodone Itching, Nausea And Vomiting and Rash   Current Facility-Administered Medications on File Prior to Encounter  Medication Dose Route Frequency  Provider Last Rate Last Admin  . cefTRIAXone (ROCEPHIN) injection 500 mg  500 mg Intramuscular Once Adline Potter, NP       Current Outpatient Medications on File Prior to Encounter  Medication Sig Dispense Refill  . Acetaminophen (TYLENOL PO) Take by mouth as needed.    . Ferrous Fumarate (HEMOCYTE - 106 MG FE) 324 (106 Fe) MG TABS tablet Take 1 tablet (106 mg of iron total) by mouth daily. 30 tablet 3  . ibuprofen (ADVIL) 800 MG tablet Take 1 tablet (800 mg total) by mouth 3 (three) times daily. (Patient taking differently: Take 800 mg by mouth as needed. ) 30 tablet 1   Social History   Socioeconomic History  . Marital status: Single    Spouse name: Not on file  . Number of children: 2  . Years of education: Not on file  . Highest education level: Not on file  Occupational History  . Not on file  Tobacco Use  . Smoking status: Former Smoker    Types: Cigarettes  . Smokeless tobacco: Never Used  Vaping Use  . Vaping Use: Some days  Substance and Sexual Activity  . Alcohol use: No    Comment: occ; not now  . Drug use: No  . Sexual activity: Not Currently    Birth control/protection: None  Other Topics Concern  . Not on file  Social History Narrative  . Not on file   Social Determinants of Health   Financial Resource Strain:   . Difficulty  of Paying Living Expenses: Not on file  Food Insecurity:   . Worried About Programme researcher, broadcasting/film/video in the Last Year: Not on file  . Ran Out of Food in the Last Year: Not on file  Transportation Needs:   . Lack of Transportation (Medical): Not on file  . Lack of Transportation (Non-Medical): Not on file  Physical Activity:   . Days of Exercise per Week: Not on file  . Minutes of Exercise per Session: Not on file  Stress:   . Feeling of Stress : Not on file  Social Connections:   . Frequency of Communication with Friends and Family: Not on file  . Frequency of Social Gatherings with Friends and Family: Not on file  . Attends  Religious Services: Not on file  . Active Member of Clubs or Organizations: Not on file  . Attends Banker Meetings: Not on file  . Marital Status: Not on file  Intimate Partner Violence:   . Fear of Current or Ex-Partner: Not on file  . Emotionally Abused: Not on file  . Physically Abused: Not on file  . Sexually Abused: Not on file   Family History  Problem Relation Age of Onset  . Autism Maternal Uncle   . Cancer Paternal Aunt        breast cancer  . Cancer Paternal Grandmother        breast cancer    OBJECTIVE:  Vitals:   06/07/20 0942  BP: 133/86  Pulse: 75  Resp: 16  Temp: 98.2 F (36.8 C)  SpO2: 98%     Physical Exam Vitals and nursing note reviewed.  Constitutional:      General: She is not in acute distress.    Appearance: Normal appearance. She is normal weight. She is not ill-appearing, toxic-appearing or diaphoretic.  HENT:     Head: Normocephalic.  Cardiovascular:     Rate and Rhythm: Normal rate and regular rhythm.     Pulses: Normal pulses.     Heart sounds: Normal heart sounds. No murmur heard.  No friction rub. No gallop.   Pulmonary:     Effort: Pulmonary effort is normal. No respiratory distress.     Breath sounds: Normal breath sounds. No stridor. No wheezing, rhonchi or rales.  Chest:     Chest wall: No tenderness.  Musculoskeletal:        General: Tenderness present.     Lumbar back: Spasms and tenderness present.     Comments: Back:  Patient ambulates from chair to exam table without difficulty.  Inspection: Skin clear and intact without obvious swelling, erythema, or ecchymosis. Warm to the touch  Palpation: Vertebral processes nontender. Tenderness about the lower paravertebral muscles  ROM: FROM Strength: 5/5 hip flexion, 5/5 knee extension, 5/5 knee flexion, 5/5 plantar flexion, 5/5 dorsiflexion     Neurological:     Mental Status: She is alert and oriented to person, place, and time.     LABS:  No results found  for this or any previous visit (from the past 24 hour(s)).   ASSESSMENT & PLAN:  1. Acute midline low back pain without sciatica     Meds ordered this encounter  Medications  . predniSONE (DELTASONE) 10 MG tablet    Sig: Take 2 tablets (20 mg total) by mouth daily.    Dispense:  15 tablet    Refill:  0  . cyclobenzaprine (FLEXERIL) 5 MG tablet    Sig: Take 1 tablet (5 mg total)  by mouth 3 (three) times daily as needed.    Dispense:  30 tablet    Refill:  0    Discharge instructions  Rest, ice and heat as needed Ensure adequate ROM as tolerated. Take Tylenol 500 mg as needed for pain every 4 hours  Prednisone was prescribed/take as directed Prescribed flexeril  for muscle spasm.  Do not drive or operate heavy machinery while taking this medication Return here or go to ER if you have any new or worsening symptoms such as numbness/tingling of the inner thighs, loss of bladder or bowel control, headache/blurry vision, nausea/vomiting, confusion/altered mental status, dizziness, weakness, passing out, imbalance, etc...   Reviewed expectations re: course of current medical issues. Questions answered. Outlined signs and symptoms indicating need for more acute intervention. Patient verbalized understanding. After Visit Summary given.         Durward Parcel, FNP 06/07/20 1022    Durward Parcel, FNP 06/07/20 1025

## 2020-06-07 NOTE — ED Triage Notes (Signed)
lower back pulled muscle strain and pain x yesterday. NKI

## 2020-06-07 NOTE — Discharge Instructions (Signed)
Rest, ice and heat as needed Ensure adequate ROM as tolerated. Take Tylenol 500 mg as needed for pain every 4 hours  Prednisone was prescribed/take as directed Prescribed flexeril  for muscle spasm.  Do not drive or operate heavy machinery while taking this medication Return here or go to ER if you have any new or worsening symptoms such as numbness/tingling of the inner thighs, loss of bladder or bowel control, headache/blurry vision, nausea/vomiting, confusion/altered mental status, dizziness, weakness, passing out, imbalance, etc..Marland Kitchen

## 2020-06-09 ENCOUNTER — Other Ambulatory Visit (HOSPITAL_COMMUNITY)
Admission: RE | Admit: 2020-06-09 | Discharge: 2020-06-09 | Disposition: A | Discharge status: Home / Self Care | Payer: Medicaid Other | Source: Ambulatory Visit | Attending: Adult Health | Admitting: Adult Health

## 2020-06-09 ENCOUNTER — Ambulatory Visit (INDEPENDENT_AMBULATORY_CARE_PROVIDER_SITE_OTHER): Payer: Medicaid Other | Admitting: Adult Health

## 2020-06-09 ENCOUNTER — Encounter: Payer: Self-pay | Admitting: Adult Health

## 2020-06-09 VITALS — BP 124/79 | HR 73 | Ht 67.0 in | Wt 316.4 lb

## 2020-06-09 DIAGNOSIS — Z30432 Encounter for removal of intrauterine contraceptive device: Secondary | ICD-10-CM

## 2020-06-09 DIAGNOSIS — Z975 Presence of (intrauterine) contraceptive device: Secondary | ICD-10-CM

## 2020-06-09 DIAGNOSIS — N921 Excessive and frequent menstruation with irregular cycle: Secondary | ICD-10-CM

## 2020-06-09 DIAGNOSIS — Z113 Encounter for screening for infections with a predominantly sexual mode of transmission: Secondary | ICD-10-CM

## 2020-06-09 NOTE — Progress Notes (Signed)
  Subjective:     Patient ID: Denise Buck, female   DOB: 12/20/1990, 29 y.o.   MRN: [id number]  HPI Cambria is a 29 year old white female,single, [id number], in for IUD removal she has had bleeding since insertion in April. She says she will use condoms.   Review of Systems Bleeding with IUD, for removal Reviewed past medical,surgical, social and family history. Reviewed medications and allergies.     Objective:   Physical Exam BP 124/79 (BP Location: Left Arm, Patient Position: Sitting, Cuff Size: Large)   Pulse 73   Ht 5\' 7"  (1.702 m)   Wt (!) 316 lb 6.4 oz (143.5 kg)   BMI 49.56 kg/m   Consent signed and time out called Skin warm and dry.Pelvic: external genitalia is normal in appearance no lesions, vagina:pink,no lesions,urethra has no lesions or masses noted, cervix:smooth and bulbous, IUD strings seen and grasped with forceps, ask pt to cough and IUD easily removed. CV swab obtained by self swab Examination chaperoned by LPN     [facility] - 06/09/20 0931      Pregnancy Intention Screening   Does the patient want to become pregnant in the next year? No    Does the patient's partner want to become pregnant in the next year? No    Would the patient like to discuss contraceptive options today? No      Contraception Wrap Up   Current Method IUD or IUS    End Method Female Condom    Contraception Counseling Provided No          Assessment:     1. Encounter for IUD removal  2. Screening examination for STD (sexually transmitted disease) CV swab sent for GC/CHL and trich She had +GC in April and was treated, but no proof of cure   3. Breakthrough bleeding with IUD IUD removed     Plan:     Return in 4 months for pap and physical

## 2020-06-10 LAB — CERVICOVAGINAL ANCILLARY ONLY
Chlamydia: NEGATIVE
Comment: NEGATIVE
Comment: NEGATIVE
Comment: NORMAL
Neisseria Gonorrhea: NEGATIVE
Trichomonas: POSITIVE — AB

## 2020-06-11 ENCOUNTER — Other Ambulatory Visit: Payer: Self-pay | Admitting: Adult Health

## 2020-06-11 ENCOUNTER — Encounter: Payer: Self-pay | Admitting: Adult Health

## 2020-06-11 MED ORDER — METRONIDAZOLE 500 MG PO TABS: 500.0000 mg | ORAL_TABLET | Freq: Two times a day (BID) | ORAL | 0 refills | Status: DC

## 2020-06-11 NOTE — Progress Notes (Signed)
+  trich on CV swab will rx flagyl

## 2020-12-08 ENCOUNTER — Encounter: Payer: Self-pay | Admitting: Adult Health

## 2020-12-08 ENCOUNTER — Ambulatory Visit (INDEPENDENT_AMBULATORY_CARE_PROVIDER_SITE_OTHER): Payer: Medicaid Other | Admitting: Adult Health

## 2020-12-08 ENCOUNTER — Other Ambulatory Visit (HOSPITAL_COMMUNITY)
Admission: RE | Admit: 2020-12-08 | Discharge: 2020-12-08 | Disposition: A | Discharge status: Home / Self Care | Payer: Medicaid Other | Source: Ambulatory Visit | Attending: Adult Health | Admitting: Adult Health

## 2020-12-08 ENCOUNTER — Other Ambulatory Visit: Payer: Self-pay

## 2020-12-08 VITALS — BP 131/86 | HR 72 | Ht 67.0 in | Wt 302.8 lb

## 2020-12-08 DIAGNOSIS — Z113 Encounter for screening for infections with a predominantly sexual mode of transmission: Secondary | ICD-10-CM | POA: Diagnosis not present

## 2020-12-08 DIAGNOSIS — N921 Excessive and frequent menstruation with irregular cycle: Secondary | ICD-10-CM | POA: Diagnosis not present

## 2020-12-08 MED ORDER — TRANEXAMIC ACID 650 MG PO TABS: ORAL_TABLET | ORAL | 3 refills | Status: DC

## 2020-12-08 NOTE — Progress Notes (Signed)
  Subjective:     Patient ID: Denise Buck, female   DOB: 16-Jan-1991, 30 y.o.   MRN: [id number]  HPI Denise Buck is a 30 year old white female,single, [id number], in complaining of heavy periods with clots, no pain and will mess clothes up. 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    Review of Systems +heavy periods with clots Denies any pain Reviewed past medical,surgical, social and family history. Reviewed medications and allergies.     Objective:   Physical Exam BP 131/86 (BP Location: Right Arm, Patient Position: Sitting, Cuff Size: Normal)   Pulse 72   Ht 5\' 7"  (1.702 m)   Wt (!) 302 lb 12.8 oz (137.3 kg)   LMP 11/23/2020 (Approximate)   BMI 47.43 kg/m  Skin warm and dry.Pelvic: external genitalia is normal in appearance no lesions, vagina: no discharge or odor,urethra has no lesions or masses noted, cervix:smooth and bulbous, uterus: normal size, shape and contour, non tender, no masses felt, adnexa: no masses or tenderness noted. Bladder is non tender and no masses felt. CV swab obtained.     [facility] - 12/08/20 1031      Pregnancy Intention Screening   Does the patient want to become pregnant in the next year? No    Does the patient's partner want to become pregnant in the next year? No    Would the patient like to discuss contraceptive options today? No      Contraception Wrap Up   Current Method Female Condom    End Method Female Condom    Contraception Counseling Provided No         Examination chaperoned by 12/10/20.  Assessment:     1. Menorrhagia with irregular cycle She declines any birth control options, so discussed Lysteda and she wants to try Meds ordered this encounter  Medications  . tranexamic acid (LYSTEDA) 650 MG TABS tablet    Sig: Take 1 tid x 5 days when on period    Dispense:  30 tablet    Refill:  3    Order Specific Question:   Supervising Provider    Answer:   [employer], LUTHER H [[id number]]     2. Screening examination for STD (sexually  transmitted disease) CV swab sent    Plan:     Return in 2 months for pap and physical and ROS

## 2020-12-09 ENCOUNTER — Other Ambulatory Visit: Payer: Self-pay | Admitting: Adult Health

## 2020-12-09 LAB — CERVICOVAGINAL ANCILLARY ONLY
Bacterial Vaginitis (gardnerella): NEGATIVE
Candida Glabrata: POSITIVE — AB
Candida Vaginitis: POSITIVE — AB
Chlamydia: NEGATIVE
Comment: NEGATIVE
Comment: NEGATIVE
Comment: NEGATIVE
Comment: NEGATIVE
Comment: NEGATIVE
Comment: NORMAL
Neisseria Gonorrhea: NEGATIVE
Trichomonas: POSITIVE — AB

## 2020-12-09 MED ORDER — METRONIDAZOLE 500 MG PO TABS: 500.0000 mg | ORAL_TABLET | Freq: Two times a day (BID) | ORAL | 0 refills | Status: DC

## 2020-12-09 MED ORDER — FLUCONAZOLE 150 MG PO TABS: ORAL_TABLET | ORAL | 1 refills | Status: DC

## 2020-12-09 NOTE — Progress Notes (Signed)
Vaginal swab +trich and yeast will rx flagyl and diflucan

## 2020-12-20 ENCOUNTER — Encounter: Payer: Self-pay | Admitting: *Deleted

## 2020-12-20 ENCOUNTER — Telehealth: Payer: Self-pay | Admitting: Adult Health

## 2020-12-20 NOTE — Telephone Encounter (Signed)
Pt advised to try 3 Ibuprofen 200mg  along with 2 Tylenol to see if that helps with her discomfort. Also, have quant checked per JAG. Pt also wants HIV and RPR checked. Pt will go to lab tomorrow. JSY

## 2020-12-20 NOTE — Telephone Encounter (Signed)
Pt called, requesting work in appointment due to bad cramps & her cycle is 4 days late, states home preg tests have been negative, OTC ibuprofen giving minimal relief, history of ectopic pregnancy  No appointments available, please advise on scheduling    Please advise & notify pt    [facility]

## 2020-12-20 NOTE — Telephone Encounter (Signed)
Voice mail not set up @ 12:43PM. I sent pt a MyChart message. JSY

## 2020-12-21 ENCOUNTER — Other Ambulatory Visit: Payer: Medicaid Other

## 2020-12-21 ENCOUNTER — Other Ambulatory Visit: Payer: Self-pay

## 2020-12-21 DIAGNOSIS — Z113 Encounter for screening for infections with a predominantly sexual mode of transmission: Secondary | ICD-10-CM

## 2020-12-21 DIAGNOSIS — Z3490 Encounter for supervision of normal pregnancy, unspecified, unspecified trimester: Secondary | ICD-10-CM

## 2020-12-22 LAB — HIV ANTIBODY (ROUTINE TESTING W REFLEX): HIV Screen 4th Generation wRfx: NONREACTIVE

## 2020-12-22 LAB — RPR: RPR Ser Ql: NONREACTIVE

## 2020-12-22 LAB — BETA HCG QUANT (REF LAB): hCG Quant: <1 m[IU]/mL

## 2020-12-31 ENCOUNTER — Other Ambulatory Visit (HOSPITAL_COMMUNITY)
Admission: RE | Admit: 2020-12-31 | Discharge: 2020-12-31 | Disposition: A | Discharge status: Home / Self Care | Payer: Medicaid Other | Source: Ambulatory Visit | Attending: Obstetrics & Gynecology | Admitting: Obstetrics & Gynecology

## 2020-12-31 ENCOUNTER — Other Ambulatory Visit: Payer: Self-pay

## 2020-12-31 ENCOUNTER — Other Ambulatory Visit (INDEPENDENT_AMBULATORY_CARE_PROVIDER_SITE_OTHER): Payer: Medicaid Other | Admitting: *Deleted

## 2020-12-31 DIAGNOSIS — Z113 Encounter for screening for infections with a predominantly sexual mode of transmission: Secondary | ICD-10-CM

## 2020-12-31 NOTE — Progress Notes (Signed)
   NURSE VISIT- VAGINITIS/STD/POC  SUBJECTIVE:  Denise Buck is a 30 y.o. [id number] GYN patientfemale here for a vaginal swab for STD screen, proof of cure after treatment for Trichomonas.  She reports the following symptoms: none for 0 days. Denies abnormal vaginal bleeding, significant pelvic pain, fever, or UTI symptoms.  OBJECTIVE:  There were no vitals taken for this visit.  Appears well, in no apparent distress  ASSESSMENT: Vaginal swab for STD screen  PLAN: Self-collected vaginal probe for Gonorrhea, Chlamydia, Trichomonas, Bacterial Vaginosis, Yeast sent to lab Treatment: to be determined once results are received Follow-up as needed if symptoms persist/worsen, or new symptoms develop  Annamarie Dawley  12/31/2020 11:58 AM

## 2020-12-31 NOTE — Progress Notes (Signed)
Chart reviewed for nurse visit. Agree with plan of care.  Carlisia Geno A, NP 12/31/2020 1:59 PM  

## 2021-01-03 LAB — CERVICOVAGINAL ANCILLARY ONLY
Bacterial Vaginitis (gardnerella): NEGATIVE
Candida Glabrata: NEGATIVE
Candida Vaginitis: NEGATIVE
Chlamydia: NEGATIVE
Comment: NEGATIVE
Comment: NEGATIVE
Comment: NEGATIVE
Comment: NEGATIVE
Comment: NEGATIVE
Comment: NORMAL
Neisseria Gonorrhea: NEGATIVE
Trichomonas: NEGATIVE

## 2021-01-11 DIAGNOSIS — Z029 Encounter for administrative examinations, unspecified: Secondary | ICD-10-CM

## 2021-01-20 ENCOUNTER — Encounter: Payer: Self-pay | Admitting: *Deleted

## 2021-01-31 ENCOUNTER — Encounter: Payer: Self-pay | Admitting: *Deleted

## 2021-02-07 ENCOUNTER — Other Ambulatory Visit: Payer: Medicaid Other | Admitting: Adult Health

## 2021-04-05 ENCOUNTER — Ambulatory Visit: Payer: Medicaid Other | Admitting: Adult Health

## 2021-04-05 ENCOUNTER — Encounter: Payer: Self-pay | Admitting: Adult Health

## 2021-04-05 ENCOUNTER — Other Ambulatory Visit (HOSPITAL_COMMUNITY)
Admission: RE | Admit: 2021-04-05 | Discharge: 2021-04-05 | Disposition: A | Discharge status: Home / Self Care | Payer: Medicaid Other | Source: Ambulatory Visit | Attending: Adult Health | Admitting: Adult Health

## 2021-04-05 ENCOUNTER — Other Ambulatory Visit: Payer: Self-pay

## 2021-04-05 VITALS — BP 133/86 | HR 80 | Ht 68.0 in | Wt 307.6 lb

## 2021-04-05 DIAGNOSIS — N921 Excessive and frequent menstruation with irregular cycle: Secondary | ICD-10-CM | POA: Diagnosis not present

## 2021-04-05 DIAGNOSIS — Z124 Encounter for screening for malignant neoplasm of cervix: Secondary | ICD-10-CM

## 2021-04-05 DIAGNOSIS — Z3009 Encounter for other general counseling and advice on contraception: Secondary | ICD-10-CM | POA: Insufficient documentation

## 2021-04-05 MED ORDER — NORETHINDRONE 0.35 MG PO TABS: 1.0000 | ORAL_TABLET | Freq: Every day | ORAL | 11 refills | Status: DC

## 2021-04-05 NOTE — Progress Notes (Signed)
  Subjective:     Patient ID: Denise Buck, female   DOB: 09-18-1990, 30 y.o.   MRN: [id number]  HPI Denise Buck is a 30 year old white female,single, [id number] in complaining of heavy periods and had 2 last month, she stopped lysteda due to feeling light headed when taking.  She works in OR at [employer].  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   Review of Systems Having heavy periods with clots and had 2 last month Stopped lysteda made her feel light headed Not currently having sex   Reviewed past medical,surgical, social and family history. Reviewed medications and allergies.     Objective:   Physical Exam BP 133/86 (BP Location: Right Arm, Patient Position: Sitting, Cuff Size: Large)   Pulse 80   Ht 5\' 8"  (1.727 m)   Wt (!) 307 lb 9.6 oz (139.5 kg)   LMP 03/08/2021   BMI 46.77 kg/m   Skin warm and dry.Pelvic: external genitalia is normal in appearance no lesions, vagina: pink with good moisture,urethra has no lesions or masses noted, cervix:smooth and bulbous, pap with GC/CHL and HR HPV genotyping performed,uterus: normal size, shape and contour, non tender, no masses felt, adnexa: no masses or tenderness noted. Bladder is non tender and no masses felt. Fall risk is low    [facility] - 04/05/21 1520       Pregnancy Intention Screening   Does the patient want to become pregnant in the next year? No    Does the patient's partner want to become pregnant in the next year? No    Would the patient like to discuss contraceptive options today? No      Contraception Wrap Up   Current Method Abstinence    End Method Female Condom;Oral Contraceptive            Examination chaperoned by 04/07/21 LPN     Assessment:      1. Routine cervical smear Pap sent  Pap in 3 years if normal  - Cytology - PAP( [facility])  2. Menorrhagia with irregular cycle Return in 3 weeks  for pelvic Malachy Mood - US PELVIC COMPLETE WITH TRANSVAGINAL; Future    Will rx Micronor, start with next period and use condom if has  sex Meds ordered this encounter  Medications   norethindrone (MICRONOR) 0.35 MG tablet    Sig: Take 1 tablet (0.35 mg total) by mouth daily.    Dispense:  28 tablet    Refill:  11    Order Specific Question:   Supervising Provider    Answer:   [place] [[id number]]    Plan:     Return in 3 weeks for pelvic Lazaro Arms

## 2021-04-07 ENCOUNTER — Other Ambulatory Visit: Payer: Self-pay | Admitting: Adult Health

## 2021-04-07 LAB — CYTOLOGY - PAP
Adequacy: ABSENT
Chlamydia: NEGATIVE
Comment: NEGATIVE
Comment: NEGATIVE
Comment: NORMAL
Diagnosis: NEGATIVE
High risk HPV: NEGATIVE
Neisseria Gonorrhea: NEGATIVE

## 2021-04-07 MED ORDER — METRONIDAZOLE 500 MG PO TABS: 500.0000 mg | ORAL_TABLET | Freq: Two times a day (BID) | ORAL | 0 refills | Status: DC

## 2021-04-07 NOTE — Progress Notes (Signed)
+  BV on pap will rx flagyl  

## 2021-04-18 ENCOUNTER — Other Ambulatory Visit: Payer: Self-pay

## 2021-04-19 MED ORDER — METRONIDAZOLE 500 MG PO TABS: 500.0000 mg | ORAL_TABLET | Freq: Two times a day (BID) | ORAL | 0 refills | Status: DC

## 2021-04-29 ENCOUNTER — Other Ambulatory Visit: Payer: Self-pay

## 2021-04-29 ENCOUNTER — Ambulatory Visit (INDEPENDENT_AMBULATORY_CARE_PROVIDER_SITE_OTHER): Payer: Medicaid Other

## 2021-04-29 DIAGNOSIS — N921 Excessive and frequent menstruation with irregular cycle: Secondary | ICD-10-CM

## 2021-04-29 NOTE — Progress Notes (Signed)
PELVIC US TA/TV: homogeneous anteverted uterus,wnl,EEC 8.6 mm,normal ovaries,ovaries appear mobile,no free fluid

## 2021-06-06 ENCOUNTER — Encounter: Payer: Self-pay | Admitting: Orthopedic Surgery

## 2021-06-06 ENCOUNTER — Other Ambulatory Visit: Payer: Self-pay

## 2021-06-06 ENCOUNTER — Ambulatory Visit: Payer: Self-pay

## 2021-06-06 ENCOUNTER — Ambulatory Visit (INDEPENDENT_AMBULATORY_CARE_PROVIDER_SITE_OTHER): Payer: Medicaid Other | Admitting: Orthopedic Surgery

## 2021-06-06 DIAGNOSIS — M25572 Pain in left ankle and joints of left foot: Secondary | ICD-10-CM | POA: Diagnosis not present

## 2021-06-06 NOTE — Progress Notes (Signed)
Office Visit Note   Patient: Denise Buck           Date of Birth: 1991-02-12           MRN: [id number] Visit Date: 06/06/2021 Requested by: No referring provider defined for this encounter. PCP: Patient, No Pcp Per (Inactive)  Subjective: Chief Complaint  Patient presents with   Left Ankle - Follow-up    HPI: Denise Buck is a 30 y.o. female who presents to the office complaining of left ankle pain.  Patient reports 3 weeks of insidious onset of worsening left ankle pain.  Localizes pain to the lateral aspect of the ankle.  Pain is best in the morning after getting out of bed and progressively worsened throughout the day to the point where it is throbbing at night when she goes to bed.  She does have a history of multiple ankle sprains but none recently.  Denies any mechanical locking symptoms or giving way of the ankle when she is walking.  She does note a snapping sensation that she has noticed in the last several weeks over the lateral aspect of the ankle.  Denies any medial sided pain.  No foot pain.  No history of surgery to the ankle.  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  The pain has been bad enough over the last week to the point where she had to scrub out of one of the cases today and asked to be relieved.  She finds it very difficult to stand for prolonged periods of time and has to constantly shift back-and-forth between her good and bad foot..  She does report a history of multiple sprains affecting the left ankle but none recently.              ROS: All systems reviewed are negative as they relate to the chief complaint within the history of present illness.  Patient denies fevers or chills.  Assessment & Plan: Visit Diagnoses:  1. 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  She has 3 weeks of left ankle pain with no  history of recent injury but does have a history of multiple sprains during her adolescence and early adulthood.  Her pain is progressively worsening to the point where she has had to miss work today.  She is having difficulty weightbearing on the ankle and walks with a limp.  With significant and progressive symptoms, plan for further evaluation with MRI of the left ankle with suspicion of peroneal tendinopathy.  With possible split tearing in the tendon.  Ultrasound examination today showed the tendons to be relatively intact.  Her ankle has slightly more laxity on the left compared to the right with varus tilt testing and anterior drawer testing.  She does have reproduction of pain and symptoms with stressing the peroneals on exam today.  She will be put on light duty with no standing more than 20 minutes at a time for 2 weeks as we work to arrive at a diagnosis..  Follow-Up Instructions: No follow-ups on file.   Orders:  Orders Placed This Encounter  Procedures   XR Ankle Complete Left   MR Ankle Left w/o contrast   No orders of the defined types were placed in this encounter.     Procedures: No procedures performed   Clinical Data: No  additional findings.  Objective: Vital Signs: There were no vitals taken for this visit.  Physical Exam:  Constitutional: Patient appears well-developed HEENT:  Head: Normocephalic Eyes:EOM are normal Neck: Normal range of motion Cardiovascular: Normal rate Pulmonary/chest: Effort normal Neurologic: Patient is alert Skin: Skin is warm Psychiatric: Patient has normal mood and affect  Ortho Exam: Ortho exam demonstrates left ankle with 2+ DP pulse.  Intact dorsiflexion, plantarflexion, inversion, eversion.  No pain with active range of motion of the ankle except for resisted eversion which does reproduce her lateral sided pain.  She has tenderness over the course of the peroneal tendons, primarily posterior and inferior to the lateral malleolus.   She has no tenderness discretely over the lateral malleolus, medial malleolus, fifth metatarsal base, Lisfranc complex, Achilles tendon, Achilles tendon insertion, posterior tibialis tendon insertion.  She does have some tenderness over the ATFL and lateral ankle joint line.  ATFL and EHL are both palpable in both ankles.  No significant cavus or pes planus deformity noted on inspection.  Negative anterior drawer of bilateral ankles.  No forefoot collapse with standing.  Specialty Comments:  No specialty comments available.  Imaging: No results found.   PMFS History: Patient Active Problem List   Diagnosis Date Noted   Routine cervical smear 04/05/2021   Menorrhagia with irregular cycle 12/08/2020   Screening examination for STD (sexually transmitted disease) 06/09/2020   Encounter for IUD removal 06/09/2020   Breakthrough bleeding with IUD 06/09/2020   Gonorrhea 12/17/2019   Ruptured left tubal ectopic pregnancy causing hemoperitoneum 12/04/2019   Family history of breast cancer 10/04/2017   Trichimoniasis 01/08/2017   Marijuana use 11/21/2016   Past Medical History:  Diagnosis Date   Chlamydia    Medical history non-contributory     Family History  Problem Relation Age of Onset   Autism Maternal Uncle    Cancer Paternal Aunt        breast cancer   Cancer Paternal Grandmother        breast cancer    Past Surgical History:  Procedure Laterality Date   CESAREAN SECTION     CESAREAN SECTION N/A 06/11/2017   Procedure: REPEAT CESAREAN SECTION;  Surgeon: Tilda Burrow, MD;  Location: [facility] BIRTHING SUITES;  Service: Obstetrics;  Laterality: N/A;   CHOLECYSTECTOMY     DIAGNOSTIC LAPAROSCOPY WITH REMOVAL OF ECTOPIC PREGNANCY N/A 12/04/2019   Procedure: DIAGNOSTIC LAPAROSCOPY WITH REMOVAL OF ECTOPIC PREGNANCY;  Surgeon: Malachy Chamber, MD;  Location: [facility] OR;  Service: Gynecology;  Laterality: N/A;   DILATION AND CURETTAGE OF UTERUS N/A 11/16/2018   Procedure: SUCTION DILATATION  AND CURETTAGE;  Surgeon: Lazaro Arms, MD;  Location: [facility] ORS;  Service: Gynecology;  Laterality: N/A;   LAPAROSCOPIC UNILATERAL SALPINGECTOMY Left 12/04/2019   Procedure: LAPAROSCOPIC LEFT SALPINGECTOMY;  Surgeon: Malachy Chamber, MD;  Location: [facility] OR;  Service: Gynecology;  Laterality: Left;   WISDOM TOOTH EXTRACTION     Social History   Occupational History   Not on file  Tobacco Use   Smoking status: Former    Types: Cigarettes   Smokeless tobacco: Never  Vaping Use   Vaping Use: Former  Substance and Sexual Activity   Alcohol use: No    Comment: occ; not now   Drug use: No   Sexual activity: Not Currently    Birth control/protection: Condom

## 2021-06-22 ENCOUNTER — Telehealth: Payer: Self-pay | Admitting: Surgical

## 2021-06-22 NOTE — Telephone Encounter (Signed)
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  She has been sticking to light duty at work and using anti-inflammatories such as Motrin and topical Voltaren without any relief of her pain.  In fact her pain is 30% worse than it was at her last office visit even despite the light duty and keeping off of her feet for extended periods of time.  She did try to work a spine case late last week to see how her ankle felt and at the conclusion of the case, she was doing everything she could to stand on 1 foot to take pressure off of her injured ankle.  She continues to complain of lateral sided ankle pain but now she is also started to notice some medial sided pain after weightbearing for long periods of time.  The primary concern is still her lateral sided pain.  Pain is keeping her up at night.  Feels like the ankle wants to give out on her.  On examination, she has intact dorsiflexion, plantarflexion, inversion, eversion.  She has decent strength with eversion but she has increased pain that she localizes to the course of the peroneal tendons, similar to her previous exam.  She has tenderness over the fifth metatarsal base and over the course of the peroneal tendons.  She also has tenderness over the insertion of the posterior tibialis tendon and tenderness over the ankle joint line.  No tenderness over the Lisfranc complex, medial or lateral malleoli.  No ecchymosis noted but she does have swelling over the lateral ankle and hindfoot compared with the contralateral side.  She cannot perform a single-leg heel raise without severe pain that she localizes to the medial aspect of the ankle and the lateral aspect of the ankle and foot.  Sensation intact over the dorsum and plantar aspect of the foot as well as the first webspace.  Plan to appeal denial of MRI of the left ankle due to patient's severe and worsening pain.  This is getting in the way of her performing her work duties and  not improving as hoped.  Pain is keeping her up at night.  She has been performing perineal ankle exercises that she got from a YouTube video by suggestion but this has not provided any relief and her pain seems to get worse when she tries to do these exercises.  Ultrasound examination of the ankle at her last visit did show intact peroneal tendons but there was fluid surrounding the tendons in the tendon sheath.

## 2021-06-28 ENCOUNTER — Other Ambulatory Visit: Payer: Self-pay | Admitting: Surgical

## 2021-06-28 MED ORDER — IBUPROFEN 800 MG PO TABS: 800.0000 mg | ORAL_TABLET | Freq: Three times a day (TID) | ORAL | 2 refills | Status: DC | PRN

## 2021-07-05 ENCOUNTER — Encounter: Payer: Self-pay | Admitting: *Deleted

## 2021-07-26 ENCOUNTER — Other Ambulatory Visit: Payer: Self-pay

## 2021-07-26 ENCOUNTER — Ambulatory Visit (HOSPITAL_COMMUNITY)
Admission: EM | Admit: 2021-07-26 | Discharge: 2021-07-26 | Disposition: A | Discharge status: Home / Self Care | Payer: Medicaid Other | Attending: Emergency Medicine | Admitting: Emergency Medicine

## 2021-07-26 ENCOUNTER — Encounter (HOSPITAL_COMMUNITY): Payer: Self-pay | Admitting: Emergency Medicine

## 2021-07-26 DIAGNOSIS — A084 Viral intestinal infection, unspecified: Secondary | ICD-10-CM | POA: Diagnosis not present

## 2021-07-26 LAB — CBG MONITORING, ED: Glucose-Capillary: 98 mg/dL (ref 70–99)

## 2021-07-26 MED ORDER — LOPERAMIDE HCL 2 MG PO CAPS: 2.0000 mg | ORAL_CAPSULE | Freq: Four times a day (QID) | ORAL | 0 refills | Status: DC | PRN

## 2021-07-26 MED ORDER — ONDANSETRON HCL 4 MG PO TABS: 4.0000 mg | ORAL_TABLET | Freq: Four times a day (QID) | ORAL | 0 refills | Status: DC

## 2021-07-26 NOTE — ED Triage Notes (Signed)
Pt reports that she had n/v/d all weekend and called out of work yesterday. Today when trying to set up at work she felt like she was going to pass out, got sweaty, chest felt like going to cave in and SOB.

## 2021-07-26 NOTE — ED Provider Notes (Signed)
[facility]    CSN: [id number] Arrival date & time: 07/26/21  0850      History   Chief Complaint Chief Complaint  Patient presents with   Near Syncope    HPI Denise Buck is a 30 y.o. female.   Presents with nausea, vomiting and diarrhea for 3 days.  Persisted today while at work she felt lightheaded and became clammy and short of breath.  Endorses that symptoms have improved since then.  Last episode of vomiting 1 day ago. Last episode of diarrhea today, described as soft. Tolerating food and liquids, but minimally. No Known sick contacts. Denies fever or chills, changes in diet or recent travel.   Past Medical History:  Diagnosis Date   Chlamydia    Medical history non-contributory     Patient Active Problem List   Diagnosis Date Noted   Routine cervical smear 04/05/2021   Menorrhagia with irregular cycle 12/08/2020   Screening examination for STD (sexually transmitted disease) 06/09/2020   Encounter for IUD removal 06/09/2020   Breakthrough bleeding with IUD 06/09/2020   Gonorrhea 12/17/2019   Ruptured left tubal ectopic pregnancy causing hemoperitoneum 12/04/2019   Family history of breast cancer 10/04/2017   Trichimoniasis 01/08/2017   Marijuana use 11/21/2016    Past Surgical History:  Procedure Laterality Date   CESAREAN SECTION     CESAREAN SECTION N/A 06/11/2017   Procedure: REPEAT CESAREAN SECTION;  Surgeon: Tilda Burrow, MD;  Location: [facility] BIRTHING SUITES;  Service: Obstetrics;  Laterality: N/A;   CHOLECYSTECTOMY     DIAGNOSTIC LAPAROSCOPY WITH REMOVAL OF ECTOPIC PREGNANCY N/A 12/04/2019   Procedure: DIAGNOSTIC LAPAROSCOPY WITH REMOVAL OF ECTOPIC PREGNANCY;  Surgeon: Malachy Chamber, MD;  Location: [facility] OR;  Service: Gynecology;  Laterality: N/A;   DILATION AND CURETTAGE OF UTERUS N/A 11/16/2018   Procedure: SUCTION DILATATION AND CURETTAGE;  Surgeon: Lazaro Arms, MD;  Location: [facility] ORS;  Service: Gynecology;  Laterality: N/A;    LAPAROSCOPIC UNILATERAL SALPINGECTOMY Left 12/04/2019   Procedure: LAPAROSCOPIC LEFT SALPINGECTOMY;  Surgeon: Malachy Chamber, MD;  Location: [facility] OR;  Service: Gynecology;  Laterality: Left;   WISDOM TOOTH EXTRACTION      OB History     Gravida  5   Para  3   Term  2   Preterm  1   AB  2   Living  2      SAB  1   IAB  0   Ectopic  1   Multiple  0   Live Births  2            Home Medications    Prior to Admission medications   Medication Sig Start Date End Date Taking? Authorizing Provider  Acetaminophen (TYLENOL PO) Take by mouth as needed.    [provider]  ibuprofen (ADVIL) 800 MG tablet Take 1 tablet (800 mg total) by mouth every 8 (eight) hours as needed. 06/28/21   Magnant, Charles L, PA-C  metroNIDAZOLE (FLAGYL) 500 MG tablet Take 1 tablet (500 mg total) by mouth 2 (two) times daily. 04/19/21   Adline Potter, NP  norethindrone (MICRONOR) 0.35 MG tablet Take 1 tablet (0.35 mg total) by mouth daily. 04/05/21   Adline Potter, NP    Family History Family History  Problem Relation Age of Onset   Autism Maternal Uncle    Cancer Paternal Aunt        breast cancer   Cancer Paternal Grandmother  breast cancer    Social History Social History   Tobacco Use   Smoking status: Former    Types: Cigarettes   Smokeless tobacco: Never  Vaping Use   Vaping Use: Former  Substance Use Topics   Alcohol use: No    Comment: occ; not now   Drug use: No     Allergies   Hydrocodone   Review of Systems Review of Systems  Constitutional: Negative.   Respiratory: Negative.    Gastrointestinal: Negative.   Musculoskeletal: Negative.   Skin: Negative.   Neurological: Negative.     Physical Exam Triage Vital Signs ED Triage Vitals  Enc Vitals Group     BP 07/26/21 0945 128/83     Pulse Rate 07/26/21 0945 62     Resp 07/26/21 0945 17     Temp 07/26/21 0945 98 F (36.7 C)     Temp Source 07/26/21 0945 Oral     SpO2  07/26/21 0945 98 %     Weight --      Height --      Head Circumference --      Peak Flow --      Pain Score 07/26/21 0944 0     Pain Loc --      Pain Edu? --      Excl. in GC? --    No data found.  Updated Vital Signs BP 128/83 (BP Location: Right Arm)   Pulse 62   Temp 98 F (36.7 C) (Oral)   Resp 17   LMP 07/12/2021   SpO2 98%   Visual Acuity Right Eye Distance:   Left Eye Distance:   Bilateral Distance:    Right Eye Near:   Left Eye Near:    Bilateral Near:     Physical Exam Constitutional:      Appearance: Normal appearance. She is normal weight.  HENT:     Head: Normocephalic.  Eyes:     Extraocular Movements: Extraocular movements intact.  Pulmonary:     Effort: Pulmonary effort is normal.  Musculoskeletal:        General: Normal range of motion.  Skin:    General: Skin is warm and dry.  Neurological:     Mental Status: She is alert and oriented to person, place, and time. Mental status is at baseline.  Psychiatric:        Mood and Affect: Mood normal.        Behavior: Behavior normal.     UC Treatments / Results  Labs (all labs ordered are listed, but only abnormal results are displayed) Labs Reviewed - No data to display  EKG   Radiology No results found.  Procedures Procedures (including critical care time)  Medications Ordered in UC Medications - No data to display  Initial Impression / Assessment and Plan / UC Course  I have reviewed the triage vital signs and the nursing notes.  Pertinent labs & imaging results that were available during my care of the patient were reviewed by me and considered in my medical decision making (see chart for details).  Viral gastroenteritis  1.  Zofran 4 mg every 6 hours as needed, advised patient to increase fluid intake and food as tolerated to prevent dehydration 2.  Imodium 2 mg 4 times daily as needed 3.  Point-of-care CBG 98  4.  Given strict return precautions to go to nearest emergency  department for worsening symptoms for evaluation of hydration status Final Clinical Impressions(s) / UC Diagnoses  Final diagnoses:  None   Discharge Instructions   None    ED Prescriptions   None    PDMP not reviewed this encounter.   Valinda Hoar, NP 07/26/21 1023

## 2021-07-26 NOTE — Discharge Instructions (Addendum)
Your symptoms are most likely caused by a virus, it will work its way out your system over the next few days   Your CBG is 98, which is within normal range   You can use zofran every 6 hours as needed for nausea, be mindful this medication may make you drowsy, take the first dose at home to see how it affects your body  You can use Imodium twice a day once in the morning once in the evening and food to help with diarrhea, and be mindful over use of this medication may cause opposite effect constipation  You can use over-the-counter ibuprofen or Tylenol, which ever you have at home, to help manage fevers  Continue to promote hydration throughout the day by using electrolyte replacement solution such as Gatorade, body armor, Pedialyte, which ever you have at home  Try eating bland foods such as bread, rice, toast, fruit which are easier on the stomach to digest, avoid foods that are overly spicy, overly seasoned or greasy

## 2021-07-27 ENCOUNTER — Ambulatory Visit
Admission: RE | Admit: 2021-07-27 | Discharge: 2021-07-27 | Disposition: A | Discharge status: Home / Self Care | Payer: Medicaid Other | Source: Ambulatory Visit | Attending: Orthopedic Surgery | Admitting: Orthopedic Surgery

## 2021-07-27 DIAGNOSIS — R600 Localized edema: Secondary | ICD-10-CM | POA: Diagnosis not present

## 2021-07-27 DIAGNOSIS — M25572 Pain in left ankle and joints of left foot: Secondary | ICD-10-CM

## 2021-07-27 DIAGNOSIS — M7732 Calcaneal spur, left foot: Secondary | ICD-10-CM | POA: Diagnosis not present

## 2021-07-28 ENCOUNTER — Other Ambulatory Visit: Payer: Self-pay | Admitting: Surgical

## 2021-07-28 MED ORDER — IBUPROFEN 800 MG PO TABS: 800.0000 mg | ORAL_TABLET | Freq: Three times a day (TID) | ORAL | 2 refills | Status: DC | PRN

## 2021-07-29 NOTE — Progress Notes (Signed)
Hi Denise Buck can you dictate encounter note from yesterday thx

## 2021-08-03 ENCOUNTER — Ambulatory Visit: Payer: Medicaid Other | Admitting: Orthopedic Surgery

## 2021-08-22 IMAGING — CT CT ABD-PELV W/ CM
2 of 5 series · 16 of 46 positions shown, 18 images · IV contrast (Omni 300)
Comparison: None.

CLINICAL DATA: Abdominal pain after removal of ectopic pregnancy.

EXAM:
CT ABDOMEN AND PELVIS WITH CONTRAST
TECHNIQUE: Multidetector CT imaging of the abdomen and pelvis was performed
using the standard protocol following bolus administration of
intravenous contrast.
CONTRAST:  100mL OMNIPAQUE IOHEXOL 300 MG/ML  SOLN

[Series 3: a/p w/ 5mm · axial · 0.98mm/px · z∈[+988,+1473]mm · 13 of 107 slices shown, 15 images]
[im 5/107  soft-tissue]
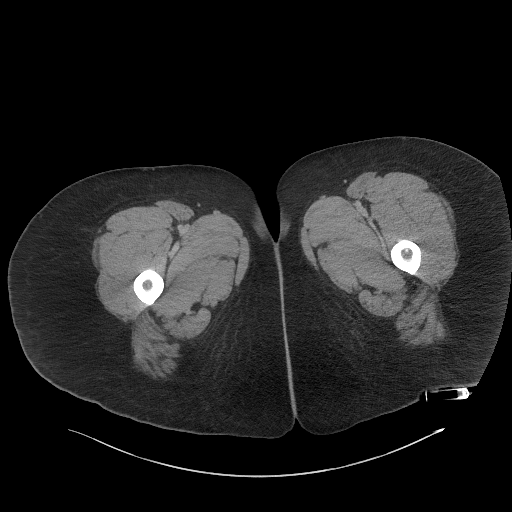
[im 5/107  bone]
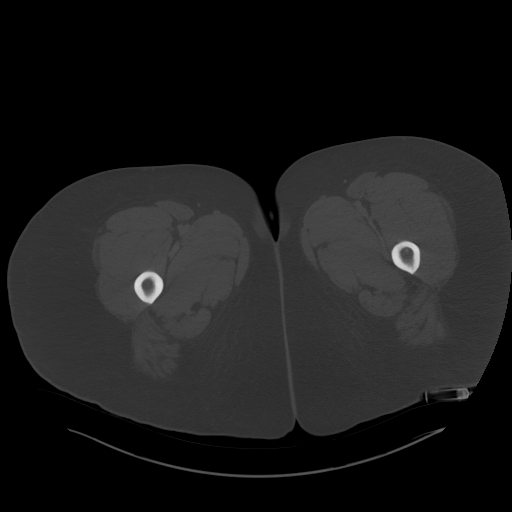
[im 14/107  soft-tissue]
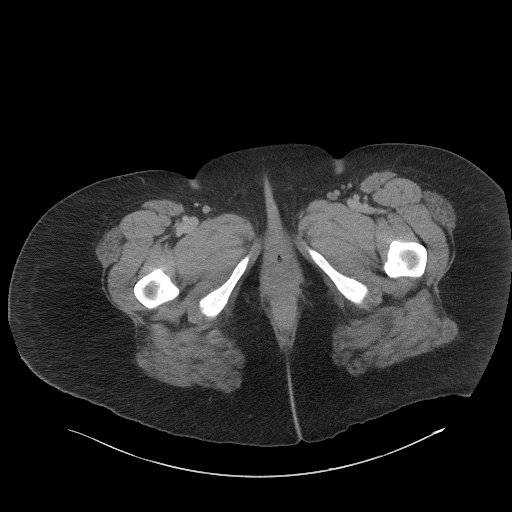
[im 24/107  soft-tissue]
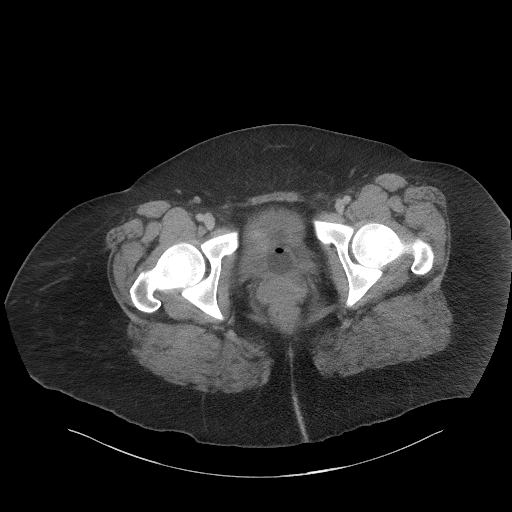
[im 28/107  soft-tissue]
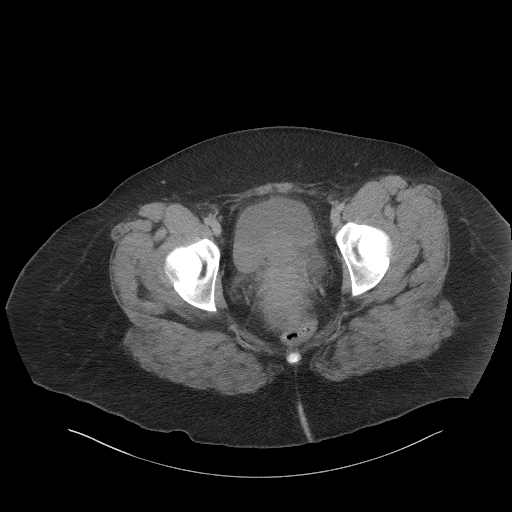
[im 37/107  soft-tissue]
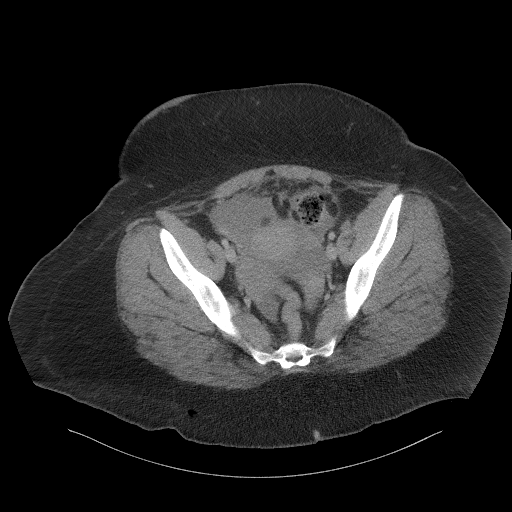
[im 47/107  soft-tissue]
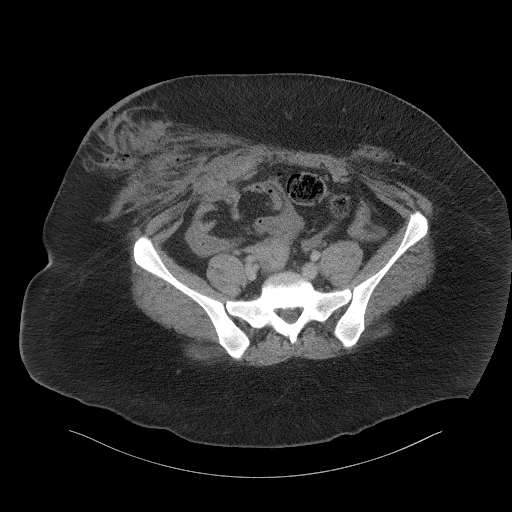
[im 56/107  soft-tissue]
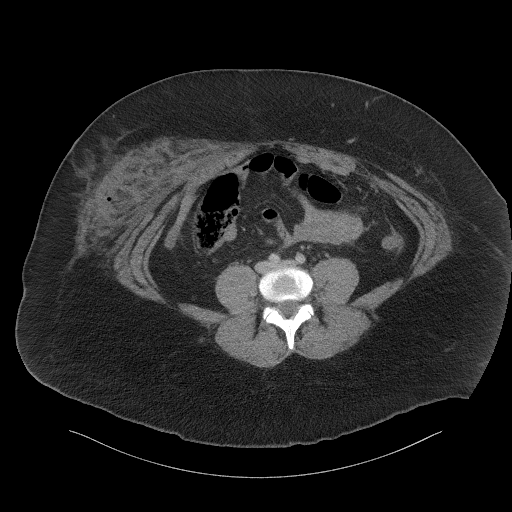
[im 60/107  soft-tissue]
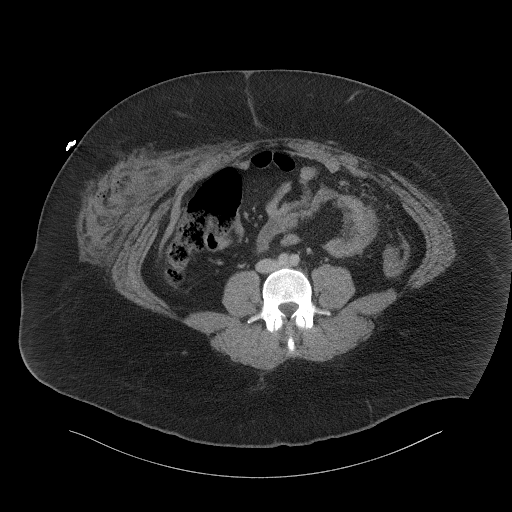
[im 70/107  soft-tissue]
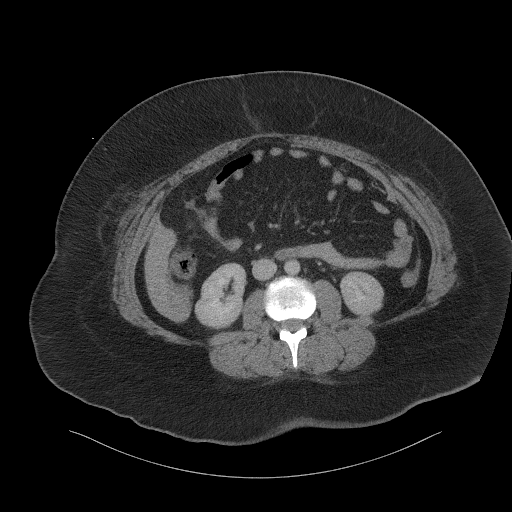
[im 70/107  bone]
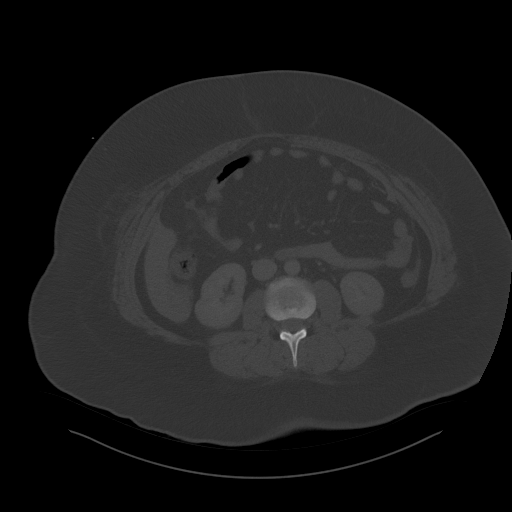
[im 79/107  soft-tissue]
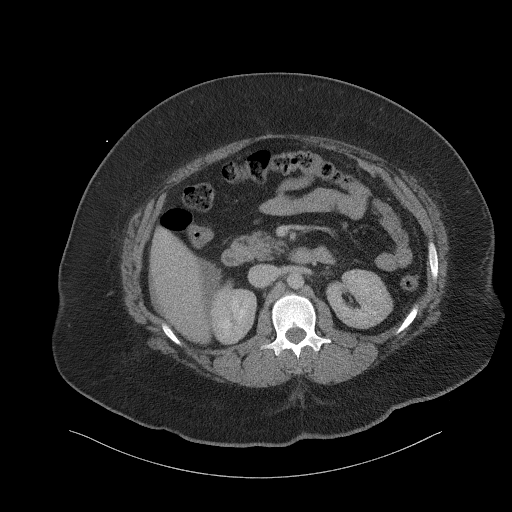
[im 83/107  soft-tissue]
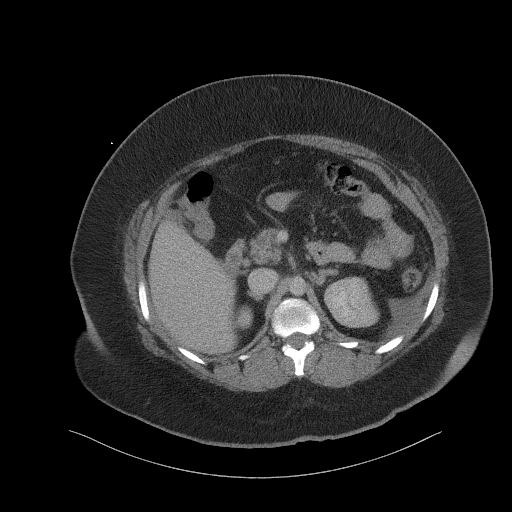
[im 93/107  soft-tissue]
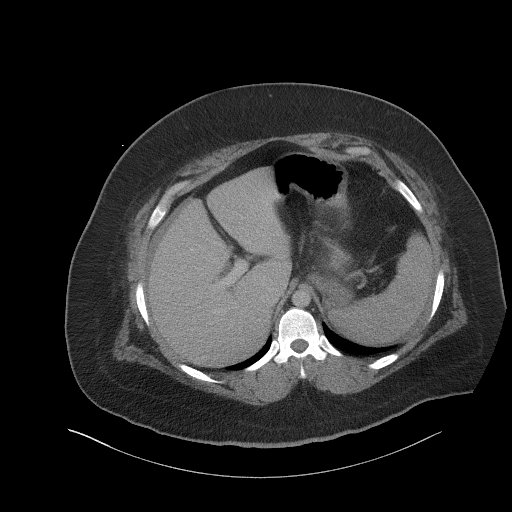
[im 102/107  soft-tissue]
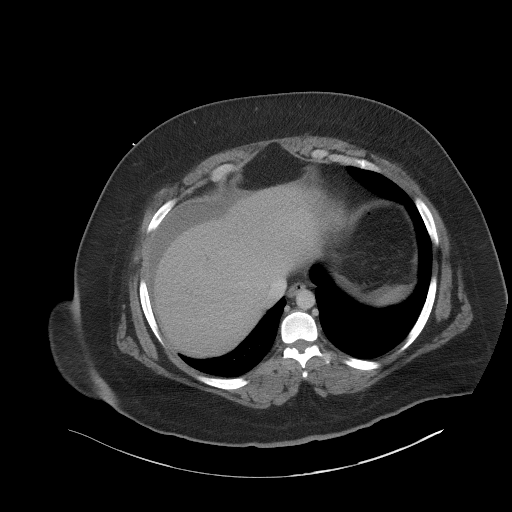

[Series 6: a/p w/ cor · coronal · 1.08mm/px · 3 of 200 slices shown]
[im 67/200  soft-tissue]
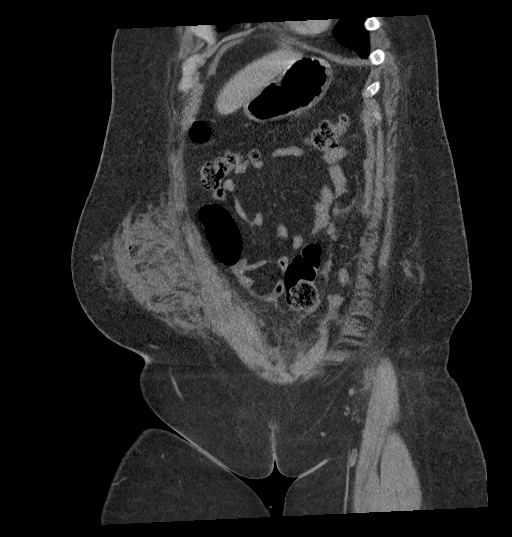
[im 89/200  soft-tissue]
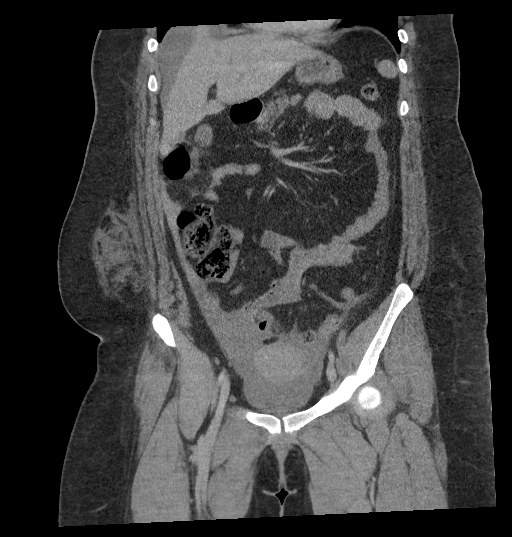
[im 111/200  soft-tissue]
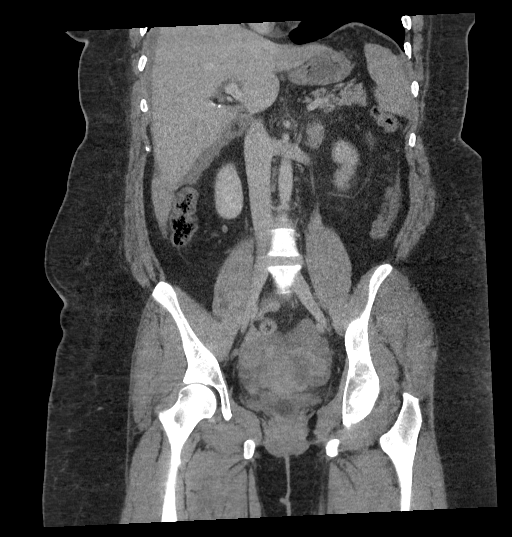

[16 of 46 positions shown; findings below may reference images not displayed]

FINDINGS: LOWER CHEST: Normal.

HEPATOBILIARY: Normal hepatic contours. No intra- or extrahepatic
biliary dilatation. Status post cholecystectomy. There is fluid
along the hepatic margin. At the inferior aspect of the liver, there
is hyperattenuating fluid that extends along the right paracolic
gutter.

PANCREAS: Normal pancreas. No ductal dilatation or peripancreatic
fluid collection.

SPLEEN: Small amount of fluid adjacent to the spleen.

ADRENALS/URINARY TRACT: The adrenal glands are normal. No
hydronephrosis, nephroureterolithiasis or solid renal mass. The
urinary bladder is normal for degree of distention

STOMACH/BOWEL: There is no hiatal hernia. Normal duodenal course and
caliber. No small bowel dilatation or inflammation. No focal colonic
abnormality. Appendix not visualized.

VASCULAR/LYMPHATIC: Normal course and caliber of the major abdominal
vessels. No abdominal or pelvic lymphadenopathy.

REPRODUCTIVE: Unremarkable appearance of the uterus. There is a
large amount mixed density fluid in the pelvis. Dependently, the
fluid is hyperattenuating consistent blood.

MUSCULOSKELETAL. No bony spinal canal stenosis or focal osseous
abnormality.

OTHER: There 3 laparoscopic port sites. There is subcutaneous
hematoma within the right anterior site measuring up to 11.8 cm.
IMPRESSION: 1. Moderate volume hemoperitoneum, greatest in the low pelvis. There
is also mixed density fluid adjacent to the spleen and liver.
2. Subcutaneous hematoma deep to the right anterior laparoscopic
port site measuring up to 11.8 cm.

## 2021-09-05 ENCOUNTER — Telehealth: Payer: Self-pay | Admitting: Surgical

## 2021-09-05 NOTE — Telephone Encounter (Signed)
-----   Message from Cammy Copa, MD sent at 07/29/2021  9:29 AM EST ----- Hi luke can you dictate encounter note from yesterday thx

## 2021-09-05 NOTE — Telephone Encounter (Signed)
Discussed Patient's MRI results with her.  Offered injection but she declined.  She feels her symptoms are improving with scheduled ibuprofen.  She will follow-up as needed if no improvement or symptoms return

## 2021-11-09 ENCOUNTER — Ambulatory Visit: Payer: Medicaid Other | Admitting: Nurse Practitioner

## 2022-01-02 ENCOUNTER — Ambulatory Visit: Payer: Medicaid Other | Admitting: Orthopedic Surgery

## 2022-01-02 ENCOUNTER — Encounter: Payer: Self-pay | Admitting: Orthopedic Surgery

## 2022-01-02 DIAGNOSIS — M79641 Pain in right hand: Secondary | ICD-10-CM | POA: Diagnosis not present

## 2022-01-03 ENCOUNTER — Encounter: Payer: Self-pay | Admitting: Orthopedic Surgery

## 2022-01-03 NOTE — Progress Notes (Signed)
? ?Office Visit Note ?  ?Patient: Denise Buck           ?Date of Birth: 13-Apr-1991           ?MRN: [id number] ?Visit Date: 01/02/2022 ?Requested by: No referring provider defined for this encounter. ?PCP: Patient, No Pcp Per (Inactive) ? ?Subjective: ?Chief Complaint  ?Patient presents with  ? Left Wrist - Pain  ? Right Wrist - Pain  ? ? ?HPI: Patient presents for evaluation of right wrist pain.  She reports over 1 month history of numbness and tingling in the right hand with pain and paresthesias digits 1 through 3.  Reports symptoms now for 3 months with symptoms getting worse over the last month.  She is right-hand dominant.  She has had 1 episode where she dropped something related to her right hand symptoms.  Wakes her up most nights but not all nights.  She does have a wrist splint.  Denies any neck pain.  Denies any dorsal numbness and tingling in the hand.  Takes ibuprofen occasionally.  Similar but much less severely significant symptoms on the left. ?             ?ROS: All systems reviewed are negative as they relate to the chief complaint within the history of present illness.  Patient denies  fevers or chills. ? ? ?Assessment & Plan: ?Visit Diagnoses:  ?1. Pain in right hand   ? ? ?Plan: Impression is right hand carpal tunnel syndrome.  Plan is nerve conduction study right lower extremity to evaluate carpal tunnel syndrome.  Continue splinting at night.  May consider ultrasound-guided carpal tunnel injection depending on severity of symptoms.  Follow-up after study ? ?Follow-Up Instructions: No follow-ups on file.  ? ?Orders:  ?Orders Placed This Encounter  ?Procedures  ? Ambulatory referral to Physical Medicine Rehab  ? ?No orders of the defined types were placed in this encounter. ? ? ? ? Procedures: ?No procedures performed ? ? ?Clinical Data: ?No additional findings. ? ?Objective: ?Vital Signs: There were no vitals taken for this visit. ? ?Physical Exam:  ? ?Constitutional: Patient appears  well-developed ?HEENT:  ?Head: Normocephalic ?Eyes:EOM are normal ?Neck: Normal range of motion ?Cardiovascular: Normal rate ?Pulmonary/chest: Effort normal ?Neurologic: Patient is alert ?Skin: Skin is warm ?Psychiatric: Patient has normal mood and affect ? ? ?Ortho Exam: Ortho exam demonstrates 5 out of 5 grip EPL FPL interosseous wrist flexion extension biceps triceps and deltoid strength no abductor pollicis brevis wasting on either hand.  Wrist range of motion is full.  No tenderness over the radial styloid.  EPL FPL interosseous strength intact.  Radial pulse intact.  Negative Tinel's cubital tunnel on the right-hand side with no subluxation of the ulnar nerve on that side. ? ?Specialty Comments:  ?No specialty comments available. ? ?Imaging: ?No results found. ? ? ?PMFS History: ?Patient Active Problem List  ? Diagnosis Date Noted  ? Routine cervical smear 04/05/2021  ? Menorrhagia with irregular cycle 12/08/2020  ? Screening examination for STD (sexually transmitted disease) 06/09/2020  ? Encounter for IUD removal 06/09/2020  ? Breakthrough bleeding with IUD 06/09/2020  ? Gonorrhea 12/17/2019  ? Ruptured left tubal ectopic pregnancy causing hemoperitoneum 12/04/2019  ? Family history of breast cancer 10/04/2017  ? Trichimoniasis 01/08/2017  ? Marijuana use 11/21/2016  ? ?Past Medical History:  ?Diagnosis Date  ? Chlamydia   ? Medical history non-contributory   ?  ?Family History  ?Problem Relation Age of Onset  ? Autism Maternal Uncle   ?  Cancer Paternal Aunt   ?     breast cancer  ? Cancer Paternal Grandmother   ?     breast cancer  ?  ?Past Surgical History:  ?Procedure Laterality Date  ? CESAREAN SECTION    ? CESAREAN SECTION N/A 06/11/2017  ? Procedure: REPEAT CESAREAN SECTION;  Surgeon: Tilda Burrow, MD;  Location: [facility] BIRTHING SUITES;  Service: Obstetrics;  Laterality: N/A;  ? CHOLECYSTECTOMY    ? DIAGNOSTIC LAPAROSCOPY WITH REMOVAL OF ECTOPIC PREGNANCY N/A 12/04/2019  ? Procedure: DIAGNOSTIC  LAPAROSCOPY WITH REMOVAL OF ECTOPIC PREGNANCY;  Surgeon: Malachy Chamber, MD;  Location: [facility] OR;  Service: Gynecology;  Laterality: N/A;  ? DILATION AND CURETTAGE OF UTERUS N/A 11/16/2018  ? Procedure: SUCTION DILATATION AND CURETTAGE;  Surgeon: Lazaro Arms, MD;  Location: [facility] ORS;  Service: Gynecology;  Laterality: N/A;  ? LAPAROSCOPIC UNILATERAL SALPINGECTOMY Left 12/04/2019  ? Procedure: LAPAROSCOPIC LEFT SALPINGECTOMY;  Surgeon: Malachy Chamber, MD;  Location: [facility] OR;  Service: Gynecology;  Laterality: Left;  ? WISDOM TOOTH EXTRACTION    ? ?Social History  ? ?Occupational History  ? Not on file  ?Tobacco Use  ? Smoking status: Former  ?  Types: Cigarettes  ? Smokeless tobacco: Never  ?Vaping Use  ? Vaping Use: Former  ?Substance and Sexual Activity  ? Alcohol use: No  ?  Comment: occ; not now  ? Drug use: No  ? Sexual activity: Not Currently  ?  Birth control/protection: Condom  ? ? ? ? ? ?

## 2022-01-04 NOTE — Progress Notes (Signed)
Subjective:    Denise Buck - 31 y.o. female MRN [id number]  Date of birth: Jul 17, 1991  HPI  Denise Buck is to establish care.   Current issues and/or concerns: Reports thinks anxiety depression likely ongoing for years but never officially diagnosed. Primarily related to work-life balance. Works as a TEFL teacher. She is a single mother of a 15 year-old and 86 year-old. Her  mother is emotionally supportive. Has noticed increased appetite. Reports as a result has gained weight. Would like to lose weight goal of being in the 250's. Interested in weight loss medication. Sleeping normal hours on most days. Has noticed that she is easily irritated and crying spells.  She is interested in medication and counseling for anxiety depression.       01/10/2022    9:06 AM 10/04/2017    8:45 AM 11/20/2016    9:40 AM  Depression screen PHQ 2/9  Decreased Interest 3 0 0  Down, Depressed, Hopeless 3 0 0  PHQ - 2 Score 6 0 0  Altered sleeping 2  0  Tired, decreased energy 3  0  Change in appetite 3  3  Feeling bad or failure about yourself  2  0  Trouble concentrating 2  0  Moving slowly or fidgety/restless 0  0  Suicidal thoughts 0  0  PHQ-9 Score 18  3  Difficult doing work/chores Extremely dIfficult      ROS per HPI     Health Maintenance:  Health Maintenance Due  Topic Date Due   COVID-19 Vaccine (1) Never done   TETANUS/TDAP  Never done     Past Medical History: Patient Active Problem List   Diagnosis Date Noted   Routine cervical smear 04/05/2021   Menorrhagia with irregular cycle 12/08/2020   Screening examination for STD (sexually transmitted disease) 06/09/2020   Encounter for IUD removal 06/09/2020   Breakthrough bleeding with IUD 06/09/2020   Gonorrhea 12/17/2019   Ruptured left tubal ectopic pregnancy causing hemoperitoneum 12/04/2019   Family history of breast cancer 10/04/2017   Trichimoniasis 01/08/2017   Marijuana use 11/21/2016     Social History    reports that she has quit smoking. Her smoking use included cigarettes. She has been exposed to tobacco smoke. She has never used smokeless tobacco. She reports that she does not drink alcohol and does not use drugs.   Family History  family history includes Autism in her maternal uncle; Cancer in her paternal aunt and paternal grandmother.   Medications: reviewed and updated   Objective:   Physical Exam BP 133/84 (BP Location: Left Arm, Patient Position: Sitting, Cuff Size: Large)   Pulse 68   Temp 98.3 F (36.8 C)   Resp 18   Ht 5' 6.54" (1.69 m)   Wt (!) 315 lb (142.9 kg)   SpO2 98%   BMI 50.03 kg/m   Physical Exam HENT:     Head: Normocephalic and atraumatic.  Eyes:     Extraocular Movements: Extraocular movements intact.     Conjunctiva/sclera: Conjunctivae normal.     Pupils: Pupils are equal, round, and reactive to light.  Cardiovascular:     Rate and Rhythm: Normal rate and regular rhythm.     Pulses: Normal pulses.     Heart sounds: Normal heart sounds.  Pulmonary:     Effort: Pulmonary effort is normal.     Breath sounds: Normal breath sounds.  Musculoskeletal:     Cervical back: Normal range of motion and neck supple.  Neurological:  General: No focal deficit present.     Mental Status: She is alert and oriented to person, place, and time.  Psychiatric:        Mood and Affect: Mood normal.        Behavior: Behavior normal.      Assessment & Plan:  1. Encounter to establish care: - Patient presents today to establish care.  - Return for annual physical examination, labs, and health maintenance. Arrive fasting meaning having no food for at least 8 hours prior to appointment. You may have only water or black coffee. Please take scheduled medications as normal.  2. Anxiety and depression: - Patient denies thoughts of self-harm, suicidal ideations, homicidal ideations. - Begin Sertraline and Hydroxyzine as prescribed.  Do not drink alcohol or use illicit  substances with with this medication.  Avoid driving or hazardous activity until you know how this medication will affect you. Your reactions could be impaired. Dizziness or fainting can cause falls, accidents, or severe injuries. Common side effects include drowsiness, nausea, constipation, loss of appetite, dry mouth, increased sweating. Call your provider if you have pounding heartbeats or fluttering in your chest, a light-headed feeling like you may pass out, easy bruising/unusal bleeding, vision change, difficult or painful urination, impotence/sexual problems, liver problems (right-sided upper stomach pain, itching, dark urine, yellowing of skin or eyes/jaundice, low levels of sodium in the body (headache, confusion, slurred speech, severe weakness, vomiting, loss of coordination, feeling unsteady), or manic episodes (racing thoughts, increased energy, decreased need for sleep, risk-taking behavior, being agitated, talkative) Seek medical attention immediately if you have symptoms of serotonin syndrome such as agitation, hallucinations, fever, sweating, shivering, fast heart rate, muscle stiffness, twitching, loss of coordination, nausea, vomiting, or diarrhea Report any new or worsening symptoms to your provider, such as but not limited to: mood or behavior changes, anxiety, panic attacks, trouble sleeping, or if you feel impulsive, irritable, agitated, hostile, aggressive, restless, hyperactive (mentally or physically), more depressed, or have thoughts about suicide or hurting yourself - Referral to Psychiatry for further evaluation and management.  - Follow-up with primary provider as scheduled. - sertraline (ZOLOFT) 25 MG tablet; Take 1 tablet (25 mg total) by mouth daily.  Dispense: 30 tablet; Refill: 0 - hydrOXYzine (VISTARIL) 25 MG capsule; Take 1 capsule (25 mg total) by mouth every 8 (eight) hours as needed.  Dispense: 30 capsule; Refill: 0 - Ambulatory referral to Psychiatry  3. Encounter  for weight management: 4. Class 3 severe obesity due to excess calories with body mass index (BMI) of 50.0 to 59.9 in adult, unspecified whether serious comorbidity present [facility]): - Discussed with patient will hold initiating weight loss medication as of present. Will see how she does on antidepressants before considering oral weight loss medication. Unable to consider injectable weight loss medications at this time due to health insurance/financial concerns. Patient agreeable. - Counseled on low-sodium, DASH diet, and 150 minutes of moderate intensity exercise per week as tolerated.     Patient was given clear instructions to go to Emergency Department or return to medical center if symptoms don't improve, worsen, or new problems develop.The patient verbalized understanding.  I discussed the assessment and treatment plan with the patient. The patient was provided an opportunity to ask questions and all were answered. The patient agreed with the plan and demonstrated an understanding of the instructions.   The patient was advised to call back or seek an in-person evaluation if the symptoms worsen or if the condition fails to improve  as anticipated.    Ricky StabsAmy Rondale Nies, NP 01/10/2022, 10:53 AM [facility]

## 2022-01-05 ENCOUNTER — Telehealth: Payer: Self-pay | Admitting: Physical Medicine and Rehabilitation

## 2022-01-05 NOTE — Telephone Encounter (Signed)
Called pt to get sch with [place]. LVM#1  

## 2022-01-10 ENCOUNTER — Encounter: Payer: Self-pay | Admitting: Family

## 2022-01-10 ENCOUNTER — Ambulatory Visit: Payer: Medicaid Other | Admitting: Family

## 2022-01-10 VITALS — BP 133/84 | HR 68 | Temp 98.3°F | Resp 18 | Ht 66.54 in | Wt 315.0 lb

## 2022-01-10 DIAGNOSIS — Z6841 Body Mass Index (BMI) 40.0 and over, adult: Secondary | ICD-10-CM

## 2022-01-10 DIAGNOSIS — F419 Anxiety disorder, unspecified: Secondary | ICD-10-CM | POA: Diagnosis not present

## 2022-01-10 DIAGNOSIS — Z7689 Persons encountering health services in other specified circumstances: Secondary | ICD-10-CM

## 2022-01-10 DIAGNOSIS — F32A Depression, unspecified: Secondary | ICD-10-CM | POA: Diagnosis not present

## 2022-01-10 MED ORDER — HYDROXYZINE PAMOATE 25 MG PO CAPS: 25.0000 mg | ORAL_CAPSULE | Freq: Three times a day (TID) | ORAL | 0 refills | Status: DC | PRN

## 2022-01-10 MED ORDER — SERTRALINE HCL 25 MG PO TABS: 25.0000 mg | ORAL_TABLET | Freq: Every day | ORAL | 0 refills | Status: DC

## 2022-01-10 NOTE — Progress Notes (Signed)
Pt presents to establish care, wants to discuss anxiety and depression medication treatment

## 2022-01-10 NOTE — Patient Instructions (Signed)
Thank you for choosing Primary Care at [facility] for your medical home!    Denise Buck was seen by Rema Fendt, NP today.   Aida Puffer primary care provider is Rema Fendt, NP.   For the best care possible,  you should try to see Ricky Stabs, NP whenever you come to office.   We look forward to seeing you again soon!  If you have any questions about your visit today,  please call us at [phone]  Or feel free to reach your provider via MyChart.    Keeping you healthy   Get these tests Blood pressure- Have your blood pressure checked once a year by your healthcare provider.  Normal blood pressure is 120/80. Weight- Have your body mass index (BMI) calculated to screen for obesity.  BMI is a measure of body fat based on height and weight. You can also calculate your own BMI at https://www.west-esparza.com/. Cholesterol- Have your cholesterol checked regularly starting at age 3, sooner may be necessary if you have diabetes, high blood pressure, if a family member developed heart diseases at an early age or if you smoke.  Chlamydia, HIV, and other sexual transmitted disease- Get screened each year until the age of 65 then within three months of each new sexual partner. Diabetes- Have your blood sugar checked regularly if you have high blood pressure, high cholesterol, a family history of diabetes or if you are overweight.   Get these vaccines Flu shot- Every fall. Tetanus shot- Every 10 years. Menactra- Single dose; prevents meningitis.   Take these steps Don't smoke- If you do smoke, ask your healthcare provider about quitting. For tips on how to quit, go to www.smokefree.gov or call 1-800-QUIT-NOW. Be physically active- Exercise 5 days a week for at least 30 minutes.  If you are not already physically active start slow and gradually work up to 30 minutes of moderate physical activity.  Examples of moderate activity include walking briskly, mowing the yard,  dancing, swimming bicycling, etc. Eat a healthy diet- Eat a variety of healthy foods such as fruits, vegetables, low fat milk, low fat cheese, yogurt, lean meats, poultry, fish, beans, tofu, etc.  For more information on healthy eating, go to www.thenutritionsource.org Drink alcohol in moderation- Limit alcohol intake two drinks or less a day.  Never drink and drive. Dentist- Brush and floss teeth twice daily; visit your dentis twice a year. Depression-Your emotional health is as important as your physical health.  If you're feeling down, losing interest in things you normally enjoy please talk with your healthcare provider. Gun Safety- If you keep a gun in your home, keep it unloaded and with the safety lock on.  Bullets should be stored separately. Helmet use- Always wear a helmet when riding a motorcycle, bicycle, rollerblading or skateboarding. Safe sex- If you may be exposed to a sexually transmitted infection, use a condom Seat belts- Seat bels can save your life; always wear one. Smoke/Carbon Monoxide detectors- These detectors need to be installed on the appropriate level of your home.  Replace batteries at least once a year. Skin Cancer- When out in the sun, cover up and use sunscreen SPF 15 or higher. Violence- If anyone is threatening or hurting you, please tell your healthcare provider.

## 2022-01-31 ENCOUNTER — Telehealth: Payer: Self-pay | Admitting: Physical Medicine and Rehabilitation

## 2022-01-31 NOTE — Telephone Encounter (Signed)
Pt called requesting a call back after 3 pm to reschedule.  Please call pt at [phone].

## 2022-02-01 ENCOUNTER — Encounter: Payer: Self-pay | Admitting: Physical Medicine and Rehabilitation

## 2022-02-01 ENCOUNTER — Ambulatory Visit (INDEPENDENT_AMBULATORY_CARE_PROVIDER_SITE_OTHER): Payer: Medicaid Other | Admitting: Physical Medicine and Rehabilitation

## 2022-02-01 DIAGNOSIS — R202 Paresthesia of skin: Secondary | ICD-10-CM | POA: Diagnosis not present

## 2022-02-01 NOTE — Progress Notes (Signed)
Pt state right hand numbness that travels from her thumb to her forearm. Pt state the numbness wakes her up at night . Pt state she drops items and she has swelling in her hands. Pt state she right handed.  Numeric Pain Rating Scale and Functional Assessment Average Pain 2   In the last MONTH (on 0-10 scale) has pain interfered with the following?  1. General activity like being  able to carry out your everyday physical activities such as walking, climbing stairs, carrying groceries, or moving a chair?  Rating(8)    -BT, -Dye Allergies.

## 2022-02-03 NOTE — Progress Notes (Signed)
Denise Buck - 31 y.o. female MRN [id number]  Date of birth: September 18, 1990  Office Visit Note: Visit Date: 02/01/2022 PCP: Rema Fendt, NP Referred by: Cammy Copa, MD  Subjective: Chief Complaint  Patient presents with   Right Hand - Numbness, Tingling, Pain   HPI:  Denise Buck is a 31 y.o. female who comes in today at the request of Dr. Burnard Bunting for electrodiagnostic study of the Right upper extremities.  Patient is Right hand dominant.  He reports more than 3 months of worsening symptoms in her right hand particular the radial digits.  She does get nocturnal complaints of pain numbness and tingling.  She has had at least 1 episode of dropping objects and feeling weakness.  No frank radicular symptoms down the arm.  Some symptoms on the left but very minor and infrequent.  She has had no prior electrodiagnostic studies.   ROS Otherwise per HPI.  Assessment & Plan: Visit Diagnoses:    ICD-10-CM   1. Paresthesia of skin  R20.2 NCV with EMG (electromyography)      Plan: Impression: Essentially NORMAL electrodiagnostic study of the right upper limb.  There is no significant electrodiagnostic evidence of nerve entrapment, brachial plexopathy or cervical radiculopathy.    As you know, purely sensory or demyelinating radiculopathies and chemical radiculitis may not be detected with this particular electrodiagnostic study.  Recommendations: 1.  Follow-up with referring physician. 2.  Continue current management of symptoms.  Meds & Orders: No orders of the defined types were placed in this encounter.   Orders Placed This Encounter  Procedures   NCV with EMG (electromyography)    Follow-up: Return in about 2 weeks (around 02/15/2022) for  G. Dorene Grebe, MD.   Procedures: No procedures performed  EMG & NCV Findings: Evaluation of the right median motor nerve showed decreased conduction velocity (Elbow-Wrist, 49 m/s).  All remaining nerves (as indicated in  the following tables) were within normal limits.    All examined muscles (as indicated in the following table) showed no evidence of electrical instability.    Impression: Essentially NORMAL electrodiagnostic study of the right upper limb.  There is no significant electrodiagnostic evidence of nerve entrapment, brachial plexopathy or cervical radiculopathy.    As you know, purely sensory or demyelinating radiculopathies and chemical radiculitis may not be detected with this particular electrodiagnostic study.  Recommendations: 1.  Follow-up with referring physician. 2.  Continue current management of symptoms.  ___________________________ Naaman Plummer FAAPMR Board Certified, American Board of Physical Medicine and Rehabilitation    Nerve Conduction Studies Anti Sensory Summary Table   Stim Site NR Peak (ms) Norm Peak (ms) P-T Amp (V) Norm P-T Amp Site1 Site2 Delta-P (ms) Dist (cm) Vel (m/s) Norm Vel (m/s)  Right Median Acr Palm Anti Sensory (2nd Digit)  31.9C  Wrist    3.6 <3.6 39.2 >10 Wrist Palm 1.9 0.0    Palm    1.7 <2.0 19.7         Right Radial Anti Sensory (Base 1st Digit)  32.4C  Wrist    1.9 <3.1 30.9  Wrist Base 1st Digit 1.9 0.0    Right Ulnar Anti Sensory (5th Digit)  32.5C  Wrist    3.1 <3.7 21.8 >15.0 Wrist 5th Digit 3.1 14.0 45 >38   Motor Summary Table   Stim Site NR Onset (ms) Norm Onset (ms) O-P Amp (mV) Norm O-P Amp Site1 Site2 Delta-0 (ms) Dist (cm) Vel (m/s) Norm Vel (m/s)  Right  Median Motor (Abd Poll Brev)  32.5C  Wrist    3.4 <4.2 9.4 >5 Elbow Wrist 3.9 19.3 *49 >50  Elbow    7.3  9.1         Right Ulnar Motor (Abd Dig Min)  32.6C  Wrist    2.9 <4.2 8.8 >3 B Elbow Wrist 3.3 20.0 61 >53  B Elbow    6.2  8.8  A Elbow B Elbow 1.1 10.0 91 >53  A Elbow    7.3  8.8          EMG   Side Muscle Nerve Root Ins Act Fibs Psw Amp Dur Poly Recrt Int Dennie Bible Comment  Right Abd Poll Brev Median C8-T1 Nml Nml Nml Nml Nml 0 Nml Nml   Right 1stDorInt Ulnar C8-T1  Nml Nml Nml Nml Nml 0 Nml Nml   Right PronatorTeres Median C6-7 Nml Nml Nml Nml Nml 0 Nml Nml   Right Biceps Musculocut C5-6 Nml Nml Nml Nml Nml 0 Nml Nml   Right Deltoid Axillary C5-6 Nml Nml Nml Nml Nml 0 Nml Nml     Nerve Conduction Studies Anti Sensory Left/Right Comparison   Stim Site L Lat (ms) R Lat (ms) L-R Lat (ms) L Amp (V) R Amp (V) L-R Amp (%) Site1 Site2 L Vel (m/s) R Vel (m/s) L-R Vel (m/s)  Median Acr Palm Anti Sensory (2nd Digit)  31.9C  Wrist  3.6   39.2  Wrist Palm     Palm  1.7   19.7        Radial Anti Sensory (Base 1st Digit)  32.4C  Wrist  1.9   30.9  Wrist Base 1st Digit     Ulnar Anti Sensory (5th Digit)  32.5C  Wrist  3.1   21.8  Wrist 5th Digit  45    Motor Left/Right Comparison   Stim Site L Lat (ms) R Lat (ms) L-R Lat (ms) L Amp (mV) R Amp (mV) L-R Amp (%) Site1 Site2 L Vel (m/s) R Vel (m/s) L-R Vel (m/s)  Median Motor (Abd Poll Brev)  32.5C  Wrist  3.4   9.4  Elbow Wrist  *49   Elbow  7.3   9.1        Ulnar Motor (Abd Dig Min)  32.6C  Wrist  2.9   8.8  B Elbow Wrist  61   B Elbow  6.2   8.8  A Elbow B Elbow  91   A Elbow  7.3   8.8           Waveforms:             Clinical History: No specialty comments available.     Objective:  VS:  HT:    WT:   BMI:     BP:   HR: bpm  TEMP: ( )  RESP:  Physical Exam Musculoskeletal:        General: No swelling, tenderness or deformity.     Comments: Inspection reveals no atrophy of the bilateral APB or FDI or hand intrinsics. There is no swelling, color changes, allodynia or dystrophic changes. There is 5 out of 5 strength in the bilateral wrist extension, finger abduction and long finger flexion. There is intact sensation to light touch in all dermatomal and peripheral nerve distributions. There is a negative Hoffmann's test bilaterally.  Skin:    General: Skin is warm and dry.     Findings: No erythema or rash.  Neurological:  General: No focal deficit present.     Mental Status:  She is alert and oriented to person, place, and time.     Motor: No weakness or abnormal muscle tone.     Coordination: Coordination normal.  Psychiatric:        Mood and Affect: Mood normal.        Behavior: Behavior normal.      Imaging: No results found.

## 2022-02-03 NOTE — Procedures (Signed)
EMG & NCV Findings: Evaluation of the right median motor nerve showed decreased conduction velocity (Elbow-Wrist, 49 m/s).  All remaining nerves (as indicated in the following tables) were within normal limits.    All examined muscles (as indicated in the following table) showed no evidence of electrical instability.    Impression: Essentially NORMAL electrodiagnostic study of the right upper limb.  There is no significant electrodiagnostic evidence of nerve entrapment, brachial plexopathy or cervical radiculopathy.    As you know, purely sensory or demyelinating radiculopathies and chemical radiculitis may not be detected with this particular electrodiagnostic study.  Recommendations: 1.  Follow-up with referring physician. 2.  Continue current management of symptoms.  ___________________________ Denise Buck FAAPMR Board Certified, American Board of Physical Medicine and Rehabilitation    Nerve Conduction Studies Anti Sensory Summary Table   Stim Site NR Peak (ms) Norm Peak (ms) P-T Amp (V) Norm P-T Amp Site1 Site2 Delta-P (ms) Dist (cm) Vel (m/s) Norm Vel (m/s)  Right Median Acr Palm Anti Sensory (2nd Digit)  31.9C  Wrist    3.6 <3.6 39.2 >10 Wrist Palm 1.9 0.0    Palm    1.7 <2.0 19.7         Right Radial Anti Sensory (Base 1st Digit)  32.4C  Wrist    1.9 <3.1 30.9  Wrist Base 1st Digit 1.9 0.0    Right Ulnar Anti Sensory (5th Digit)  32.5C  Wrist    3.1 <3.7 21.8 >15.0 Wrist 5th Digit 3.1 14.0 45 >38   Motor Summary Table   Stim Site NR Onset (ms) Norm Onset (ms) O-P Amp (mV) Norm O-P Amp Site1 Site2 Delta-0 (ms) Dist (cm) Vel (m/s) Norm Vel (m/s)  Right Median Motor (Abd Poll Brev)  32.5C  Wrist    3.4 <4.2 9.4 >5 Elbow Wrist 3.9 19.3 *49 >50  Elbow    7.3  9.1         Right Ulnar Motor (Abd Dig Min)  32.6C  Wrist    2.9 <4.2 8.8 >3 B Elbow Wrist 3.3 20.0 61 >53  B Elbow    6.2  8.8  A Elbow B Elbow 1.1 10.0 91 >53  A Elbow    7.3  8.8          EMG   Side  Muscle Nerve Root Ins Act Fibs Psw Amp Dur Poly Recrt Int Denise Buck Comment  Right Abd Poll Brev Median C8-T1 Nml Nml Nml Nml Nml 0 Nml Nml   Right 1stDorInt Ulnar C8-T1 Nml Nml Nml Nml Nml 0 Nml Nml   Right PronatorTeres Median C6-7 Nml Nml Nml Nml Nml 0 Nml Nml   Right Biceps Musculocut C5-6 Nml Nml Nml Nml Nml 0 Nml Nml   Right Deltoid Axillary C5-6 Nml Nml Nml Nml Nml 0 Nml Nml     Nerve Conduction Studies Anti Sensory Left/Right Comparison   Stim Site L Lat (ms) R Lat (ms) L-R Lat (ms) L Amp (V) R Amp (V) L-R Amp (%) Site1 Site2 L Vel (m/s) R Vel (m/s) L-R Vel (m/s)  Median Acr Palm Anti Sensory (2nd Digit)  31.9C  Wrist  3.6   39.2  Wrist Palm     Palm  1.7   19.7        Radial Anti Sensory (Base 1st Digit)  32.4C  Wrist  1.9   30.9  Wrist Base 1st Digit     Ulnar Anti Sensory (5th Digit)  32.5C  Wrist  3.1  21.8  Wrist 5th Digit  45    Motor Left/Right Comparison   Stim Site L Lat (ms) R Lat (ms) L-R Lat (ms) L Amp (mV) R Amp (mV) L-R Amp (%) Site1 Site2 L Vel (m/s) R Vel (m/s) L-R Vel (m/s)  Median Motor (Abd Poll Brev)  32.5C  Wrist  3.4   9.4  Elbow Wrist  *49   Elbow  7.3   9.1        Ulnar Motor (Abd Dig Min)  32.6C  Wrist  2.9   8.8  B Elbow Wrist  61   B Elbow  6.2   8.8  A Elbow B Elbow  91   A Elbow  7.3   8.8           Waveforms:

## 2022-02-08 NOTE — Progress Notes (Unsigned)
Patient ID: Denise Buck, female    DOB: 1991/01/03  MRN: [id number]  CC: Weight Check  Subjective: Denise Buck is a 31 y.o. female who presents for weight check.   Her concerns today include:    01/10/2022: - Discussed with patient will hold initiating weight loss medication as of present. Will see how she does on antidepressants before considering oral weight loss medication. Unable to consider injectable weight loss medications at this time due to health insurance/financial concerns. Patient agreeable. - Counseled on low-sodium, DASH diet, and 150 minutes of moderate intensity exercise per week as tolerated.   Patient Active Problem List   Diagnosis Date Noted   Routine cervical smear 04/05/2021   Menorrhagia with irregular cycle 12/08/2020   Screening examination for STD (sexually transmitted disease) 06/09/2020   Encounter for IUD removal 06/09/2020   Breakthrough bleeding with IUD 06/09/2020   Gonorrhea 12/17/2019   Ruptured left tubal ectopic pregnancy causing hemoperitoneum 12/04/2019   Family history of breast cancer 10/04/2017   Trichimoniasis 01/08/2017   Marijuana use 11/21/2016     Current Outpatient Medications on File Prior to Visit  Medication Sig Dispense Refill   Acetaminophen (TYLENOL PO) Take by mouth as needed.     hydrOXYzine (VISTARIL) 25 MG capsule Take 1 capsule (25 mg total) by mouth every 8 (eight) hours as needed. 30 capsule 0   ibuprofen (ADVIL) 800 MG tablet Take 1 tablet (800 mg total) by mouth every 8 (eight) hours as needed. 90 tablet 2   loperamide (IMODIUM) 2 MG capsule Take 1 capsule (2 mg total) by mouth 4 (four) times daily as needed for diarrhea or loose stools. 12 capsule 0   ondansetron (ZOFRAN) 4 MG tablet Take 1 tablet (4 mg total) by mouth every 6 (six) hours. 12 tablet 0   sertraline (ZOLOFT) 25 MG tablet Take 1 tablet (25 mg total) by mouth daily. 30 tablet 0   No current facility-administered medications on file prior to  visit.    Allergies  Allergen Reactions   Hydrocodone Itching, Nausea And Vomiting and Rash    Social History   Socioeconomic History   Marital status: Single    Spouse name: Not on file   Number of children: 2   Years of education: Not on file   Highest education level: Not on file  Occupational History   Not on file  Tobacco Use   Smoking status: Former    Types: Cigarettes    Passive exposure: Past   Smokeless tobacco: Never  Vaping Use   Vaping Use: Former  Substance and Sexual Activity   Alcohol use: No    Comment: occ; not now   Drug use: No   Sexual activity: Not Currently    Birth control/protection: Condom  Other Topics Concern   Not on file  Social History Narrative   Not on file   Social Determinants of Health   Financial Resource Strain: Low Risk  (11/15/2018)   Overall Financial Resource Strain (CARDIA)    Difficulty of Paying Living Expenses: Not hard at all  Food Insecurity: Unknown (11/15/2018)   Hunger Vital Sign    Worried About Running Out of Food in the Last Year: Patient refused    Ran Out of Food in the Last Year: Patient refused  Transportation Needs: Unknown (11/15/2018)   PRAPARE - Transportation    Lack of Transportation (Medical): Patient refused    Lack of Transportation (Non-Medical): Patient refused  Physical Activity: Insufficiently Active (11/15/2018)  Exercise Vital Sign    Days of Exercise per Week: 3 days    Minutes of Exercise per Session: 40 min  Stress: No Stress Concern Present (11/15/2018)   [place]    Feeling of Stress : Not at all  Social Connections: Moderately Isolated (11/15/2018)   Social Connection and Isolation Panel [NHANES]    Frequency of Communication with Friends and Family: More than three times a week    Frequency of Social Gatherings with Friends and Family: Once a week    Attends Religious Services: Never    Marine scientist or  Organizations: No    Attends Archivist Meetings: Never    Marital Status: Never married  Intimate Partner Violence: Unknown (11/15/2018)   Humiliation, Afraid, Rape, and Kick questionnaire    Fear of Current or Ex-Partner: Patient refused    Emotionally Abused: Patient refused    Physically Abused: Patient refused    Sexually Abused: Patient refused    Family History  Problem Relation Age of Onset   Autism Maternal Uncle    Cancer Paternal Aunt        breast cancer   Cancer Paternal Grandmother        breast cancer    Past Surgical History:  Procedure Laterality Date   CESAREAN SECTION     CESAREAN SECTION N/A 06/11/2017   Procedure: REPEAT CESAREAN SECTION;  Surgeon: Jonnie Kind, MD;  Location: [facility];  Service: Obstetrics;  Laterality: N/A;   CHOLECYSTECTOMY     DIAGNOSTIC LAPAROSCOPY WITH REMOVAL OF ECTOPIC PREGNANCY N/A 12/04/2019   Procedure: DIAGNOSTIC LAPAROSCOPY WITH REMOVAL OF ECTOPIC PREGNANCY;  Surgeon: Cherre Blanc, MD;  Location: [facility];  Service: Gynecology;  Laterality: N/A;   DILATION AND CURETTAGE OF UTERUS N/A 11/16/2018   Procedure: SUCTION DILATATION AND CURETTAGE;  Surgeon: Florian Buff, MD;  Location: [facility];  Service: Gynecology;  Laterality: N/A;   LAPAROSCOPIC UNILATERAL SALPINGECTOMY Left 12/04/2019   Procedure: LAPAROSCOPIC LEFT SALPINGECTOMY;  Surgeon: Cherre Blanc, MD;  Location: [facility];  Service: Gynecology;  Laterality: Left;   WISDOM TOOTH EXTRACTION      ROS: Review of Systems Negative except as stated above  PHYSICAL EXAM: There were no vitals taken for this visit.  Physical Exam  {female adult master:310786} {female adult master:310785}     Latest Ref Rng & Units 12/04/2019    5:23 PM 12/04/2019    4:05 PM 12/04/2019    3:01 PM  CMP  Glucose 70 - 99 mg/dL 157     BUN 6 - 20 mg/dL 15     Creatinine 0.44 - 1.00 mg/dL 0.71     Sodium 135 - 145 mmol/L 138  138  138   Potassium 3.5 - 5.1 mmol/L 4.8   5.0  4.6   Chloride 98 - 111 mmol/L 111     CO2 22 - 32 mmol/L 23     Calcium 8.9 - 10.3 mg/dL 7.6      Lipid Panel  No results found for: "CHOL", "TRIG", "HDL", "CHOLHDL", "VLDL", "LDLCALC", "LDLDIRECT"  CBC    Component Value Date/Time   WBC 16.2 (H) 12/05/2019 0759   RBC 3.29 (L) 12/05/2019 0759   HGB 9.0 (L) 12/05/2019 0759   HGB 12.2 03/29/2017 0903   HCT 28.7 (L) 12/05/2019 0759   HCT 36.6 03/29/2017 0903   PLT 223 12/05/2019 0759   PLT 222 03/29/2017 0903   MCV  87.2 12/05/2019 0759   MCV 88 03/29/2017 0903   MCH 27.4 12/05/2019 0759   MCHC 31.4 12/05/2019 0759   RDW 14.9 12/05/2019 0759   RDW 13.7 03/29/2017 0903   LYMPHSABS 2.2 12/04/2019 1201   MONOABS 0.5 12/04/2019 1201   EOSABS 0.1 12/04/2019 1201   BASOSABS 0.1 12/04/2019 1201    ASSESSMENT AND PLAN:  There are no diagnoses linked to this encounter.   Patient was given the opportunity to ask questions.  Patient verbalized understanding of the plan and was able to repeat key elements of the plan. Patient was given clear instructions to go to Emergency Department or return to medical center if symptoms don't improve, worsen, or new problems develop.The patient verbalized understanding.   No orders of the defined types were placed in this encounter.    Requested Prescriptions    No prescriptions requested or ordered in this encounter    No follow-ups on file.  Rema Fendt, NP

## 2022-02-10 ENCOUNTER — Other Ambulatory Visit: Payer: Self-pay | Admitting: Surgical

## 2022-02-10 MED ORDER — IBUPROFEN 800 MG PO TABS: 800.0000 mg | ORAL_TABLET | Freq: Three times a day (TID) | ORAL | 2 refills | Status: DC | PRN

## 2022-02-14 ENCOUNTER — Encounter: Payer: Self-pay | Admitting: Family

## 2022-02-14 ENCOUNTER — Ambulatory Visit: Payer: Medicaid Other | Admitting: Family

## 2022-02-14 VITALS — BP 125/78 | HR 70 | Temp 98.3°F | Resp 18 | Ht 66.54 in | Wt 305.0 lb

## 2022-02-14 DIAGNOSIS — Z6841 Body Mass Index (BMI) 40.0 and over, adult: Secondary | ICD-10-CM | POA: Diagnosis not present

## 2022-02-14 DIAGNOSIS — F419 Anxiety disorder, unspecified: Secondary | ICD-10-CM

## 2022-02-14 DIAGNOSIS — F32A Depression, unspecified: Secondary | ICD-10-CM | POA: Diagnosis not present

## 2022-02-14 DIAGNOSIS — Z7689 Persons encountering health services in other specified circumstances: Secondary | ICD-10-CM

## 2022-02-14 DIAGNOSIS — Z131 Encounter for screening for diabetes mellitus: Secondary | ICD-10-CM | POA: Diagnosis not present

## 2022-02-14 LAB — POCT GLYCOSYLATED HEMOGLOBIN (HGB A1C): Hemoglobin A1C: 5.1 % (ref 4.0–5.6)

## 2022-02-14 MED ORDER — SEMAGLUTIDE-WEIGHT MANAGEMENT 0.25 MG/0.5ML ~~LOC~~ SOAJ
0.2500 mg | SUBCUTANEOUS | 0 refills | Status: DC
Start: 2022-02-14 — End: 2022-02-24

## 2022-02-14 MED ORDER — SEMAGLUTIDE-WEIGHT MANAGEMENT 0.25 MG/0.5ML ~~LOC~~ SOAJ: 0.2500 mg | SUBCUTANEOUS | 0 refills | Status: DC

## 2022-02-14 MED ORDER — HYDROXYZINE PAMOATE 25 MG PO CAPS: 25.0000 mg | ORAL_CAPSULE | Freq: Three times a day (TID) | ORAL | 3 refills | Status: DC | PRN

## 2022-02-14 MED ORDER — PHENTERMINE HCL 15 MG PO CAPS: 15.0000 mg | ORAL_CAPSULE | ORAL | 0 refills | Status: DC

## 2022-02-14 MED ORDER — SERTRALINE HCL 25 MG PO TABS: 25.0000 mg | ORAL_TABLET | Freq: Every day | ORAL | 3 refills | Status: DC

## 2022-02-14 NOTE — Progress Notes (Signed)
rEstablished Patient Office Visit  Subjective   Patient ID: Denise Buck, female    DOB: 1991/03/08  Age: 31 y.o. MRN: [id number]  Chief Complaint  Patient presents with   Weight Check   Needs refills on Sertraline     Objective:     Temp 98.3 F (36.8 C)   Resp 18   Ht 5' 6.54" (1.69 m)   BMI 50.03 kg/m  Wt Readings from Last 3 Encounters:  01/10/22 (!) 315 lb (142.9 kg)  04/05/21 (!) 307 lb 9.6 oz (139.5 kg)  12/08/20 (!) 302 lb 12.8 oz (137.3 kg)      Physical Exam   No results found for any visits on 02/14/22.     The ASCVD Risk score (Arnett DK, et al., 2019) failed to calculate for the following reasons:   The 2019 ASCVD risk score is only valid for ages 19 to 58    Assessment & Plan:   Problem List Items Addressed This Visit   None Visit Diagnoses     Anxiety and depression    -  Primary       No follow-ups on file.    Margorie John, CMA

## 2022-02-17 ENCOUNTER — Telehealth: Payer: Self-pay

## 2022-02-17 NOTE — Telephone Encounter (Signed)
PA started for [facility]#[id number]

## 2022-02-22 ENCOUNTER — Other Ambulatory Visit: Payer: Self-pay | Admitting: Adult Health

## 2022-02-22 ENCOUNTER — Ambulatory Visit: Payer: Medicaid Other | Admitting: Orthopedic Surgery

## 2022-02-22 DIAGNOSIS — Z3201 Encounter for pregnancy test, result positive: Secondary | ICD-10-CM

## 2022-02-22 NOTE — Progress Notes (Signed)
Ck QHCG  

## 2022-02-23 NOTE — Progress Notes (Signed)
Erroneous encounter-disregard

## 2022-02-24 ENCOUNTER — Other Ambulatory Visit: Payer: Self-pay

## 2022-02-24 ENCOUNTER — Encounter (HOSPITAL_COMMUNITY): Payer: Self-pay | Admitting: Obstetrics & Gynecology

## 2022-02-24 ENCOUNTER — Inpatient Hospital Stay (HOSPITAL_COMMUNITY): Payer: Medicaid Other

## 2022-02-24 ENCOUNTER — Inpatient Hospital Stay (HOSPITAL_COMMUNITY)
Admission: AD | Admit: 2022-02-24 | Discharge: 2022-02-24 | Disposition: A | Discharge status: Home / Self Care | Payer: Medicaid Other | Attending: Obstetrics & Gynecology | Admitting: Obstetrics & Gynecology

## 2022-02-24 DIAGNOSIS — Z711 Person with feared health complaint in whom no diagnosis is made: Secondary | ICD-10-CM | POA: Diagnosis not present

## 2022-02-24 DIAGNOSIS — O3680X0 Pregnancy with inconclusive fetal viability, not applicable or unspecified: Secondary | ICD-10-CM | POA: Diagnosis not present

## 2022-02-24 DIAGNOSIS — N83202 Unspecified ovarian cyst, left side: Secondary | ICD-10-CM | POA: Diagnosis not present

## 2022-02-24 DIAGNOSIS — O26891 Other specified pregnancy related conditions, first trimester: Secondary | ICD-10-CM | POA: Insufficient documentation

## 2022-02-24 DIAGNOSIS — O09292 Supervision of pregnancy with other poor reproductive or obstetric history, second trimester: Secondary | ICD-10-CM | POA: Insufficient documentation

## 2022-02-24 DIAGNOSIS — Z8759 Personal history of other complications of pregnancy, childbirth and the puerperium: Secondary | ICD-10-CM | POA: Diagnosis not present

## 2022-02-24 DIAGNOSIS — Z3A01 Less than 8 weeks gestation of pregnancy: Secondary | ICD-10-CM | POA: Insufficient documentation

## 2022-02-24 DIAGNOSIS — N83201 Unspecified ovarian cyst, right side: Secondary | ICD-10-CM | POA: Diagnosis not present

## 2022-02-24 LAB — URINALYSIS, ROUTINE W REFLEX MICROSCOPIC
Bilirubin Urine: NEGATIVE
Glucose, UA: NEGATIVE mg/dL
Hgb urine dipstick: NEGATIVE
Ketones, ur: 20 mg/dL — AB
Leukocytes,Ua: NEGATIVE
Nitrite: NEGATIVE
Protein, ur: NEGATIVE mg/dL
Specific Gravity, Urine: 1.013 (ref 1.005–1.030)
pH: 5 (ref 5.0–8.0)

## 2022-02-24 LAB — HCG, QUANTITATIVE, PREGNANCY: hCG, Beta Chain, Quant, S: 18996 m[IU]/mL — ABNORMAL HIGH (ref <5)

## 2022-02-24 LAB — ABO/RH: ABO/RH(D): O POS

## 2022-02-24 LAB — POCT PREGNANCY, URINE: Preg Test, Ur: POSITIVE — AB

## 2022-02-24 NOTE — MAU Note (Signed)
Denise Buck is a 31 y.o. at Unknown here in MAU reporting: she is slightly light headed, reports it's better than before.  States was working and had to stop working due to lightheadedness.  Reports concerned because has Hx of previous ectopic. LMP: 01/15/2022 Onset of complaint: today Pain score: 0 Vitals:   02/24/22 1155  BP: 135/79  Pulse: 63  Resp: 18  Temp: 97.9 F (36.6 C)  SpO2: 100%     FHT:N/A Lab orders placed from triage:   UPT

## 2022-02-24 NOTE — MAU Provider Note (Signed)
Event Date/Time   First Provider Initiated Contact with Patient 02/24/22 1438      S [name] is a 31 y.o. [id number] patient who presents to MAU today with complaint of slight lightheadness. She was working in the OR as a ST and became lightheaded, but since arrval to MAU she feels better. She voices concerns because the last time she felt this way "she had a ruptured ectopic during a case, and loss 3L of blood." She denies vaginal bleeding, and abdominal pain.   O BP 135/79 (BP Location: Right Arm)   Pulse 63   Temp 97.9 F (36.6 C) (Oral)   Resp 18   Ht 5\' 6"  (1.676 m)   Wt (!) 139.9 kg   LMP 01/15/2022   SpO2 100%   BMI 49.78 kg/m  Physical Exam Vitals and nursing note reviewed.  HENT:     Head: Normocephalic.  Pulmonary:     Effort: Pulmonary effort is normal. No respiratory distress.  Musculoskeletal:     Cervical back: Normal range of motion.  Skin:    General: Skin is warm and dry.  Neurological:     Mental Status: She is alert and oriented to person, place, and time.  Psychiatric:        Mood and Affect: Mood normal.     Comments: Anxious and worried     A Medical screening exam complete Lab Orders         Urinalysis, Routine w reflex microscopic Urine, Clean Catch         hCG, quantitative, pregnancy         Pregnancy, urine POC    Results for orders placed or performed during the hospital encounter of 02/24/22 (from the past 24 hour(s))  Urinalysis, Routine w reflex microscopic Urine, Clean Catch     Status: Abnormal   Collection Time: 02/24/22 12:03 PM  Result Value Ref Range   Color, Urine YELLOW YELLOW   APPearance HAZY (A) CLEAR   Specific Gravity, Urine 1.013 1.005 - 1.030   pH 5.0 5.0 - 8.0   Glucose, UA NEGATIVE NEGATIVE mg/dL   Hgb urine dipstick NEGATIVE NEGATIVE   Bilirubin Urine NEGATIVE NEGATIVE   Ketones, ur 20 (A) NEGATIVE mg/dL   Protein, ur NEGATIVE NEGATIVE mg/dL   Nitrite NEGATIVE NEGATIVE   Leukocytes,Ua NEGATIVE NEGATIVE   Pregnancy, urine POC     Status: Abnormal   Collection Time: 02/24/22 12:03 PM  Result Value Ref Range   Preg Test, Ur POSITIVE (A) NEGATIVE  ABO/Rh     Status: None   Collection Time: 02/24/22  1:12 PM  Result Value Ref Range   ABO/RH(D) O POS    No rh immune globuloin      NOT A RH IMMUNE GLOBULIN CANDIDATE, PT RH POSITIVE Performed at [facility], [address] [place], [place]   hCG, quantitative, pregnancy     Status: Abnormal   Collection Time: 02/24/22  1:12 PM  Result Value Ref Range   hCG, Beta Chain, Quant, S 18,996 (H) <5 mIU/mL   04/27/22 OB LESS THAN 14 WEEKS WITH OB TRANSVAGINAL  Result Date: 02/24/2022 CLINICAL DATA:  A 31 year old presents for evaluation of pregnancy with inconclusive viability in history of ectopic pregnancy. Beta HCG currently pending. Gestational age by last menstrual cycle 5 weeks 5 days. EXAM: OBSTETRIC <14 WK 37 AND TRANSVAGINAL OB US TECHNIQUE: Both transabdominal and transvaginal ultrasound examinations were performed for complete evaluation of the gestation as well  as the maternal uterus, adnexal regions, and pelvic cul-de-sac. Transvaginal technique was performed to assess early pregnancy. COMPARISON:  Prior imaging from April 29, 2021. FINDINGS: Intrauterine gestational sac: Single Yolk sac:  Visualized Embryo:  Visualized Cardiac Activity: Not visualized CRL:  4.0 mm   6 w   1 d                  [place] EDC: 10/19/2022 Subchorionic hemorrhage:  None visualized. Maternal uterus/adnexae: Small cysts in bilateral ovaries. Signs of prior C-section. Small free fluid in the pelvis. IMPRESSION: Single intrauterine gestation of 6 weeks 1 day by crown-rump length without current documented heart rate/cardiac activity. Findings are suspicious but not yet definitive for failed pregnancy. Recommend follow-up US in 10-14 days for definitive diagnosis. This recommendation follows SRU consensus guidelines: Diagnostic Criteria for Nonviable Pregnancy Early  in the First Trimester. Malva Limes Med 2013; 950:9326-71. Electronically Signed   By: Donzetta Kohut M.D.   On: 02/24/2022 14:29    SIUP- measuring ~6 weeks 1 day. No cardiac activity identified. Recommendation to follow up in 10-14 days for a viability scan.  Patient agreeable to plan.   P 1. History of ectopic pregnancy   2. Physically well but worried   3. [redacted] weeks gestation of pregnancy   - Discussed that this is not an ectopic pregnancy and Patient reassured of SIUP. - Plan to follow up for viability [place] at [facility]. Message sent to the office.  - Encouraged patient to start a prenatal vitamin.  - Early pregnancy bleeding precautions reviewed.  - Patient discharged home in stable condition and may return to MAU as needed.    Carlynn Herald, CNM 02/24/2022 2:45 PM

## 2022-03-02 ENCOUNTER — Encounter: Payer: Medicaid Other | Admitting: Family

## 2022-03-02 DIAGNOSIS — Z13220 Encounter for screening for lipoid disorders: Secondary | ICD-10-CM

## 2022-03-02 DIAGNOSIS — Z7689 Persons encountering health services in other specified circumstances: Secondary | ICD-10-CM

## 2022-03-02 DIAGNOSIS — Z0000 Encounter for general adult medical examination without abnormal findings: Secondary | ICD-10-CM

## 2022-03-02 DIAGNOSIS — Z13228 Encounter for screening for other metabolic disorders: Secondary | ICD-10-CM

## 2022-03-02 DIAGNOSIS — F419 Anxiety disorder, unspecified: Secondary | ICD-10-CM

## 2022-03-02 DIAGNOSIS — Z130 Encounter for screening for diseases of the blood and blood-forming organs and certain disorders involving the immune mechanism: Secondary | ICD-10-CM

## 2022-03-02 DIAGNOSIS — Z1329 Encounter for screening for other suspected endocrine disorder: Secondary | ICD-10-CM

## 2022-03-10 HISTORY — PX: INDUCED ABORTION: SHX677

## 2022-03-13 ENCOUNTER — Ambulatory Visit: Payer: Medicaid Other | Admitting: Orthopedic Surgery

## 2022-03-14 ENCOUNTER — Other Ambulatory Visit: Payer: Self-pay

## 2022-03-14 ENCOUNTER — Ambulatory Visit: Payer: Medicaid Other

## 2022-03-14 ENCOUNTER — Other Ambulatory Visit: Payer: Medicaid Other

## 2022-03-14 DIAGNOSIS — O3680X0 Pregnancy with inconclusive fetal viability, not applicable or unspecified: Secondary | ICD-10-CM

## 2022-03-28 ENCOUNTER — Encounter: Payer: Self-pay | Admitting: Obstetrics & Gynecology

## 2022-03-28 ENCOUNTER — Ambulatory Visit: Payer: Medicaid Other | Admitting: Obstetrics & Gynecology

## 2022-03-28 VITALS — BP 130/76 | HR 76 | Ht 66.0 in | Wt 296.6 lb

## 2022-03-28 DIAGNOSIS — N939 Abnormal uterine and vaginal bleeding, unspecified: Secondary | ICD-10-CM | POA: Diagnosis not present

## 2022-03-28 DIAGNOSIS — R030 Elevated blood-pressure reading, without diagnosis of hypertension: Secondary | ICD-10-CM

## 2022-03-28 DIAGNOSIS — Z30016 Encounter for initial prescription of transdermal patch hormonal contraceptive device: Secondary | ICD-10-CM

## 2022-03-28 DIAGNOSIS — O0480 (Induced) termination of pregnancy with unspecified complications: Secondary | ICD-10-CM | POA: Diagnosis not present

## 2022-03-28 DIAGNOSIS — Z6841 Body Mass Index (BMI) 40.0 and over, adult: Secondary | ICD-10-CM | POA: Diagnosis not present

## 2022-03-28 MED ORDER — TWIRLA 120-30 MCG/24HR TD PTWK: 1.0000 | MEDICATED_PATCH | TRANSDERMAL | 4 refills | Status: AC

## 2022-03-28 NOTE — Progress Notes (Signed)
GYN VISIT Patient name: Denise Buck MRN [id number]  Date of birth: May 21, 1991 Chief Complaint:   had abortion 4 weeks ago (Still passing tissue; c section incision is red and painful at times)  History of Present Illness:   Denise Buck is a 31 y.o. [phone]  female being seen today for the following concerns: AUB: Pt underwent TAB- [facility]-July 21st-  in [place].  Given pills, noted 3 day of heavy bleeding/cramping with pasage of tissue.  Bleeding slowed, but never completely resolved.  Last week, Tuesday at night, started having HMB- soaking through pad in under an hour for several hours and then resolved.  Denies pelvic pain.  No fever or chills.  No other acute complaints.  Of note, reports prior SAB with retained products requiring D&C as well as ectopic with large EBL.  When she say the bleeding again last week she was concerned about complications.  Patient's last menstrual period was 01/15/2022.     01/10/2022    9:06 AM 10/04/2017    8:45 AM 11/20/2016    9:40 AM  Depression screen PHQ 2/9  Decreased Interest 3 0 0  Down, Depressed, Hopeless 3 0 0  PHQ - 2 Score 6 0 0  Altered sleeping 2  0  Tired, decreased energy 3  0  Change in appetite 3  3  Feeling bad or failure about yourself  2  0  Trouble concentrating 2  0  Moving slowly or fidgety/restless 0  0  Suicidal thoughts 0  0  PHQ-9 Score 18  3  Difficult doing work/chores Extremely dIfficult       Review of Systems:   Pertinent items are noted in HPI Denies fever/chills, dizziness, headaches, visual disturbances, fatigue, shortness of breath, chest pain, abdominal pain, vomiting, see HPI regarding menses.  Denies problems with bowel movements, urination, or intercourse unless otherwise stated above.  Pertinent History Reviewed:  Reviewed past medical,surgical, social, obstetrical and family history.  Reviewed problem list, medications and allergies. Physical Assessment:   Vitals:   03/28/22 1546  03/28/22 1648  BP: (!) 144/87 130/76  Pulse: 76 76  Weight: 296 lb 9.6 oz (134.5 kg)   Height: 5\' 6"  (1.676 m)   Body mass index is 47.87 kg/m.       Physical Examination:   General appearance: alert, well appearing, and in no distress  Psych: mood appropriate, normal affect  Skin: warm & dry   Cardiovascular: normal heart rate noted  Respiratory: normal respiratory effort, no distress  Abdomen: soft, non-tender, non-tender, no rebound, no guarding  Pelvic: normal external genitalia, vulva, vagina, cervix- visualized, appears closed, minimal blood noted- no tissue visualized.  On bimanual exam- no CMT, no uterine tenderness  Extremities: no edema   Chaperone:  Dr.     Assessment & Plan:  1) AUB -reviewed that AUB may be due to recent TAB, return of menses or complication from TAB -for now plan to check HCG and CBC -discussed that while an Alfredo Martinez may be helpful it can also be very challenging to assess for retained products vs normal findings -since bleeding has improved, will hold off on [place] at this time- should HMB return advised pt to go directly to [facility] -if HCG still elevated plan to follow until non-pregnancy state  2) Contraceptive management -previously tried Nexplanon and IUD noted AUB -noncompliance with pills -plan for patch, f/u in 3 mos OCP risk assessment: Pt denies personal history of VTE, stroke or heart attack.  Denies personal h/o breast cancer.  Pt is either a non-smoker or smoker under the age of 30yo.  Denies h/o migraines with aura -repeat BP normal- will continue to monitor trend with patch.  Also discussed that with current BMI patch not ideal and would encourage 2 forms of contraception such as continued use of condoms  Meds ordered this encounter  Medications   Levonorgestrel-Eth Estradiol (TWIRLA) 120-30 MCG/24HR PTWK    Sig: Place 1 patch onto the skin once a week.    Dispense:  9 patch    Refill:  4     Orders Placed This Encounter   Procedures   Beta hCG quant (ref lab)   CBC    Return in 3 months (on 06/28/2022) for Medication follow up.   Myna Hidalgo, DO Attending Obstetrician & Gynecologist, [facility] for [employer], [facility]

## 2022-03-29 LAB — CBC
Hematocrit: 38.6 % (ref 34.0–46.6)
Hemoglobin: 12.6 g/dL (ref 11.1–15.9)
MCH: 29.2 pg (ref 26.6–33.0)
MCHC: 32.6 g/dL (ref 31.5–35.7)
MCV: 90 fL (ref 79–97)
Platelets: 257 10*3/uL (ref 150–450)
RBC: 4.31 x10E6/uL (ref 3.77–5.28)
RDW: 13.5 % (ref 11.7–15.4)
WBC: 8.8 10*3/uL (ref 3.4–10.8)

## 2022-03-29 LAB — BETA HCG QUANT (REF LAB): hCG Quant: 65 m[IU]/mL

## 2022-03-29 NOTE — Addendum Note (Signed)
Addended by: Sharon Seller on: 03/29/2022 08:37 AM   Modules accepted: Orders

## 2022-04-12 ENCOUNTER — Other Ambulatory Visit: Payer: Medicaid Other

## 2022-04-12 DIAGNOSIS — O0480 (Induced) termination of pregnancy with unspecified complications: Secondary | ICD-10-CM

## 2022-04-12 DIAGNOSIS — N939 Abnormal uterine and vaginal bleeding, unspecified: Secondary | ICD-10-CM

## 2022-04-21 DIAGNOSIS — Z113 Encounter for screening for infections with a predominantly sexual mode of transmission: Secondary | ICD-10-CM | POA: Diagnosis not present

## 2022-04-21 DIAGNOSIS — Z0389 Encounter for observation for other suspected diseases and conditions ruled out: Secondary | ICD-10-CM | POA: Diagnosis not present

## 2022-04-21 DIAGNOSIS — Z202 Contact with and (suspected) exposure to infections with a predominantly sexual mode of transmission: Secondary | ICD-10-CM | POA: Diagnosis not present

## 2022-04-21 DIAGNOSIS — Z114 Encounter for screening for human immunodeficiency virus [HIV]: Secondary | ICD-10-CM | POA: Diagnosis not present

## 2022-05-25 ENCOUNTER — Encounter: Payer: Self-pay | Admitting: Obstetrics & Gynecology

## 2022-05-25 ENCOUNTER — Ambulatory Visit (INDEPENDENT_AMBULATORY_CARE_PROVIDER_SITE_OTHER): Payer: Medicaid Other | Admitting: *Deleted

## 2022-05-25 DIAGNOSIS — Z3202 Encounter for pregnancy test, result negative: Secondary | ICD-10-CM

## 2022-05-25 DIAGNOSIS — Z3200 Encounter for pregnancy test, result unknown: Secondary | ICD-10-CM

## 2022-05-25 LAB — POCT URINE PREGNANCY: Preg Test, Ur: NEGATIVE

## 2022-05-25 NOTE — Progress Notes (Signed)
Pt came in for a pregnancy test and hcg level. She had a TAB over the summer and last quant was 65 on 8/8. Pt did not follow up for repeat hcg level. She started the birth control patch and started having bleeding. She decided to take a pregnancy test at home and 2 positive and several negative. In our office today test is negative. Patient was sent to lab for quant hcg. Advised we would let her know tomorrow.

## 2022-05-26 LAB — BETA HCG QUANT (REF LAB): hCG Quant: <1 m[IU]/mL

## 2022-06-26 ENCOUNTER — Ambulatory Visit (INDEPENDENT_AMBULATORY_CARE_PROVIDER_SITE_OTHER): Payer: Medicaid Other | Admitting: Orthopaedic Surgery

## 2022-06-26 ENCOUNTER — Ambulatory Visit (INDEPENDENT_AMBULATORY_CARE_PROVIDER_SITE_OTHER): Payer: Medicaid Other

## 2022-06-26 DIAGNOSIS — S838X2A Sprain of other specified parts of left knee, initial encounter: Secondary | ICD-10-CM

## 2022-06-26 DIAGNOSIS — G8929 Other chronic pain: Secondary | ICD-10-CM | POA: Diagnosis not present

## 2022-06-26 DIAGNOSIS — M25562 Pain in left knee: Secondary | ICD-10-CM | POA: Diagnosis not present

## 2022-06-26 NOTE — Progress Notes (Signed)
Chief Complaint: Left knee pain     History of Present Illness:    Denise Buck is a 31 y.o. female presents today with 3 to 4 months of left knee pain.  She is experiencing this medially.  She did not have any known injury.  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  She does not feel like it is popping or giving out.  She is experiencing swelling in the joint.  She is here today for further assessment.    Surgical History:   none  PMH/PSH/Family History/Social History/Meds/Allergies:    Past Medical History:  Diagnosis Date   Chlamydia    Medical history non-contributory    Past Surgical History:  Procedure Laterality Date   CESAREAN SECTION     CESAREAN SECTION N/A 06/11/2017   Procedure: REPEAT CESAREAN SECTION;  Surgeon: Tilda Burrow, MD;  Location: [facility] BIRTHING SUITES;  Service: Obstetrics;  Laterality: N/A;   CHOLECYSTECTOMY     DIAGNOSTIC LAPAROSCOPY WITH REMOVAL OF ECTOPIC PREGNANCY N/A 12/04/2019   Procedure: DIAGNOSTIC LAPAROSCOPY WITH REMOVAL OF ECTOPIC PREGNANCY;  Surgeon: Malachy Chamber, MD;  Location: [facility] OR;  Service: Gynecology;  Laterality: N/A;   DILATION AND CURETTAGE OF UTERUS N/A 11/16/2018   Procedure: SUCTION DILATATION AND CURETTAGE;  Surgeon: Lazaro Arms, MD;  Location: [facility];  Service: Gynecology;  Laterality: N/A;   INDUCED ABORTION  03/10/2022   LAPAROSCOPIC UNILATERAL SALPINGECTOMY Left 12/04/2019   Procedure: LAPAROSCOPIC LEFT SALPINGECTOMY;  Surgeon: Malachy Chamber, MD;  Location: [facility] OR;  Service: Gynecology;  Laterality: Left;   WISDOM TOOTH EXTRACTION     Social History   Socioeconomic History   Marital status: Single    Spouse name: Not on file   Number of children: 2   Years of education: Not on file   Highest education level: Not on file  Occupational History   Not on file  Tobacco Use   Smoking status: Former    Types:  Cigarettes    Passive exposure: Past   Smokeless tobacco: Never  Vaping Use   Vaping Use: Every day  Substance and Sexual Activity   Alcohol use: No    Comment: occ; not now   Drug use: No   Sexual activity: Not Currently    Birth control/protection: Condom  Other Topics Concern   Not on file  Social History Narrative   Not on file   Social Determinants of Health   Financial Resource Strain: Low Risk  (11/15/2018)   Overall Financial Resource Strain (CARDIA)    Difficulty of Paying Living Expenses: Not hard at all  Food Insecurity: Unknown (11/15/2018)   Hunger Vital Sign    Worried About Running Out of Food in the Last Year: Patient refused    Ran Out of Food in the Last Year: Patient refused  Transportation Needs: Unknown (11/15/2018)   PRAPARE - Transportation    Lack of Transportation (Medical): Patient refused    Lack of Transportation (Non-Medical): Patient refused  Physical Activity: Insufficiently Active (11/15/2018)   Exercise Vital Sign    Days of Exercise per Week: 3 days    Minutes of Exercise per Session: 40 min  Stress: No Stress Concern Present (11/15/2018)   [employer] of Occupational Health -  Occupational Stress Questionnaire    Feeling of Stress : Not at all  Social Connections: Moderately Isolated (11/15/2018)   Social Connection and Isolation Panel [NHANES]    Frequency of Communication with Friends and Family: More than three times a week    Frequency of Social Gatherings with Friends and Family: Once a week    Attends Religious Services: Never    Database administrator or Organizations: No    Attends Engineer, structural: Never    Marital Status: Never married   Family History  Problem Relation Age of Onset   Autism Maternal Uncle    Cancer Paternal Aunt        breast cancer   Cancer Paternal Grandmother        breast cancer   Allergies  Allergen Reactions   Hydrocodone Itching, Nausea And Vomiting and Rash   Current Outpatient  Medications  Medication Sig Dispense Refill   Acetaminophen (TYLENOL PO) Take by mouth as needed. (Patient not taking: Reported on 05/25/2022)     hydrOXYzine (VISTARIL) 25 MG capsule Take 1 capsule (25 mg total) by mouth every 8 (eight) hours as needed. 30 capsule 3   Levonorgestrel-Eth Estradiol (TWIRLA) 120-30 MCG/24HR PTWK Place 1 patch onto the skin once a week. 9 patch 4   sertraline (ZOLOFT) 25 MG tablet Take 1 tablet (25 mg total) by mouth daily. 30 tablet 3   No current facility-administered medications for this visit.   No results found.  Review of Systems:   A ROS was performed including pertinent positives and negatives as documented in the HPI.  Physical Exam :   Constitutional: NAD and appears stated age Neurological: Alert and oriented Psych: Appropriate affect and cooperative There were no vitals taken for this visit.   Comprehensive Musculoskeletal Exam:      Musculoskeletal Exam  Gait Normal  Alignment Normal   Right Left  Inspection Normal Normal  Palpation    Tenderness None  Medial joint space  Crepitus None None  Effusion None  Positive  Range of Motion    Extension 0 0  Flexion 135 135  Strength    Extension 5/5 5/5  Flexion 5/5 5/5  Ligament Exam     Generalized Laxity No No  Lachman Negative Negative   Pivot Shift Negative Negative  Anterior Drawer Negative Negative  Valgus at 0 Negative Negative  Valgus at 20 Negative Negative  Varus at 0 0 0  Varus at 20   0 0  Posterior Drawer at 90 0 0  Vascular/Lymphatic Exam    Edema None None  Venous Stasis Changes No No  Distal Circulation Normal Normal  Neurologic    Light Touch Sensation Intact Intact  Special Tests: Positive McMurray medially     Imaging:   Xray (4 views left knee): Normal  I personally reviewed and interpreted the radiographs.   Assessment:   31 y.o. female with left medial based knee pain that has been ongoing now for approximately 3 to 4 months.  At this time I  am concerned about a meniscal root tear.  To this effect I do believe an MRI is needed in order to rule this out.  I did describe that the standard of care for meniscal root tear is early treatment and as result an MRI is indicated to rule this out.  She is trialed a strengthening program now for 6 weeks without improvement.   Plan :    -Plan for MRI and follow-up to  discuss results     I personally saw and evaluated the patient, and participated in the management and treatment plan.  Vanetta Mulders, MD Attending Physician, Orthopedic Surgery  This document was dictated using Dragon voice recognition software. A reasonable attempt at proof reading has been made to minimize errors.

## 2022-07-07 ENCOUNTER — Other Ambulatory Visit (HOSPITAL_BASED_OUTPATIENT_CLINIC_OR_DEPARTMENT_OTHER): Payer: Self-pay

## 2022-07-07 ENCOUNTER — Ambulatory Visit (INDEPENDENT_AMBULATORY_CARE_PROVIDER_SITE_OTHER): Payer: Medicaid Other

## 2022-07-07 ENCOUNTER — Ambulatory Visit (INDEPENDENT_AMBULATORY_CARE_PROVIDER_SITE_OTHER): Payer: Medicaid Other | Admitting: Orthopaedic Surgery

## 2022-07-07 DIAGNOSIS — M7732 Calcaneal spur, left foot: Secondary | ICD-10-CM | POA: Diagnosis not present

## 2022-07-07 DIAGNOSIS — M25572 Pain in left ankle and joints of left foot: Secondary | ICD-10-CM

## 2022-07-07 MED ORDER — MELOXICAM 15 MG PO TABS
15.0000 mg | ORAL_TABLET | Freq: Every day | ORAL | 0 refills | Status: DC
  Filled 2022-07-07: qty 14, 14d supply, fill #0

## 2022-07-07 NOTE — Progress Notes (Signed)
Chief Complaint: left foot pain     History of Present Illness:    Denise Buck is a 31 y.o. female presents with left foot pain after a plank landed directly on the left foot. She has since been having pain with weight bearing as well as numbness in the 1-3rd toes. She is here today for additional assessment.    Surgical History:   None  PMH/PSH/Family History/Social History/Meds/Allergies:    Past Medical History:  Diagnosis Date   Chlamydia    Medical history non-contributory    Past Surgical History:  Procedure Laterality Date   CESAREAN SECTION     CESAREAN SECTION N/A 06/11/2017   Procedure: REPEAT CESAREAN SECTION;  Surgeon: Jonnie Kind, MD;  Location: [place];  Service: Obstetrics;  Laterality: N/A;   CHOLECYSTECTOMY     DIAGNOSTIC LAPAROSCOPY WITH REMOVAL OF ECTOPIC PREGNANCY N/A 12/04/2019   Procedure: DIAGNOSTIC LAPAROSCOPY WITH REMOVAL OF ECTOPIC PREGNANCY;  Surgeon: Cherre Blanc, MD;  Location: [place];  Service: Gynecology;  Laterality: N/A;   DILATION AND CURETTAGE OF UTERUS N/A 11/16/2018   Procedure: SUCTION DILATATION AND CURETTAGE;  Surgeon: Florian Buff, MD;  Location: [facility];  Service: Gynecology;  Laterality: N/A;   INDUCED ABORTION  03/10/2022   LAPAROSCOPIC UNILATERAL SALPINGECTOMY Left 12/04/2019   Procedure: LAPAROSCOPIC LEFT SALPINGECTOMY;  Surgeon: Cherre Blanc, MD;  Location: [place];  Service: Gynecology;  Laterality: Left;   WISDOM TOOTH EXTRACTION     Social History   Socioeconomic History   Marital status: Single    Spouse name: Not on file   Number of children: 2   Years of education: Not on file   Highest education level: Not on file  Occupational History   Not on file  Tobacco Use   Smoking status: Former    Types: Cigarettes    Passive exposure: Past   Smokeless tobacco: Never  Vaping Use   Vaping Use: Every day  Substance and Sexual Activity   Alcohol use: No     Comment: occ; not now   Drug use: No   Sexual activity: Not Currently    Birth control/protection: Condom  Other Topics Concern   Not on file  Social History Narrative   Not on file   Social Determinants of Health   Financial Resource Strain: Low Risk  (11/15/2018)   Overall Financial Resource Strain (CARDIA)    Difficulty of Paying Living Expenses: Not hard at all  Food Insecurity: Unknown (11/15/2018)   Hunger Vital Sign    Worried About [place] in the Last Year: Patient refused    [place] in the Last Year: Patient refused  Transportation Needs: Unknown (11/15/2018)   [place] - Transportation    Lack of Transportation (Medical): Patient refused    Lack of Transportation (Non-Medical): Patient refused  Physical Activity: Insufficiently Active (11/15/2018)   Exercise Vital Sign    Days of Exercise per Week: 3 days    Minutes of Exercise per Session: 40 min  Stress: No Stress Concern Present (11/15/2018)   [employer] [place]    Feeling of Stress : Not at all  Social Connections: Moderately Isolated (11/15/2018)   Social Connection and Isolation Panel [NHANES]    Frequency of Communication with  Friends and Family: More than three times a week    Frequency of Social Gatherings with Friends and Family: Once a week    Attends Religious Services: Never    Database administrator or Organizations: No    Attends Engineer, structural: Never    Marital Status: Never married   Family History  Problem Relation Age of Onset   Autism Maternal Uncle    Cancer Paternal Aunt        breast cancer   Cancer Paternal Grandmother        breast cancer   Allergies  Allergen Reactions   Hydrocodone Itching, Nausea And Vomiting and Rash   Current Outpatient Medications  Medication Sig Dispense Refill   meloxicam (MOBIC) 15 MG tablet Take 1 tablet (15 mg total) by mouth daily. 14 tablet 0   Acetaminophen  (TYLENOL PO) Take by mouth as needed. (Patient not taking: Reported on 05/25/2022)     hydrOXYzine (VISTARIL) 25 MG capsule Take 1 capsule (25 mg total) by mouth every 8 (eight) hours as needed. 30 capsule 3   sertraline (ZOLOFT) 25 MG tablet Take 1 tablet (25 mg total) by mouth daily. 30 tablet 3   No current facility-administered medications for this visit.   No results found.  Review of Systems:   A ROS was performed including pertinent positives and negatives as documented in the HPI.  Physical Exam :   Constitutional: NAD and appears stated age Neurological: Alert and oriented Psych: Appropriate affect and cooperative Last menstrual period 06/18/2022.   Comprehensive Musculoskeletal Exam:    Tenderness palpation about the left dorsal aspect of the foot in the extensor tendons.  She is also tender over the AITFL.  Remainder of distal neurosensory exam is intact  Imaging:   Xray (left foot 3 views): Normal    I personally reviewed and interpreted the radiographs.   Assessment:   31 y.o. female with left dorsal foot pain over the extensor tendons consistent with bony contusion or tendon irritation.  That effect I recommended a cam boot short walker for 1 week total to help relieve her pain.  I will also prescribe her 1 week course of Mobic.  I will plan to see her back as needed  Plan :    -Return to clinic as needed     I personally saw and evaluated the patient, and participated in the management and treatment plan.  Huel Cote, MD Attending Physician, Orthopedic Surgery  This document was dictated using Dragon voice recognition software. A reasonable attempt at proof reading has been made to minimize errors.

## 2022-07-10 ENCOUNTER — Ambulatory Visit
Admission: RE | Admit: 2022-07-10 | Discharge: 2022-07-10 | Disposition: A | Discharge status: Home / Self Care | Payer: Medicaid Other | Source: Ambulatory Visit | Attending: Orthopaedic Surgery | Admitting: Orthopaedic Surgery

## 2022-07-10 DIAGNOSIS — S838X2A Sprain of other specified parts of left knee, initial encounter: Secondary | ICD-10-CM

## 2022-07-10 DIAGNOSIS — M25562 Pain in left knee: Secondary | ICD-10-CM | POA: Diagnosis not present

## 2022-07-17 ENCOUNTER — Ambulatory Visit (INDEPENDENT_AMBULATORY_CARE_PROVIDER_SITE_OTHER): Payer: Medicaid Other | Admitting: Orthopaedic Surgery

## 2022-07-17 DIAGNOSIS — M23012 Cystic meniscus, anterior horn of medial meniscus, left knee: Secondary | ICD-10-CM

## 2022-07-17 DIAGNOSIS — M25562 Pain in left knee: Secondary | ICD-10-CM | POA: Diagnosis not present

## 2022-07-17 MED ORDER — TRIAMCINOLONE ACETONIDE 40 MG/ML IJ SUSP
80.0000 mg | INTRAMUSCULAR | Status: AC | PRN
  Administered 2022-07-17: 80 mg via INTRA_ARTICULAR

## 2022-07-17 MED ORDER — LIDOCAINE HCL 1 % IJ SOLN
4.0000 mL | INTRAMUSCULAR | Status: AC | PRN
  Administered 2022-07-17: 4 mL

## 2022-07-17 NOTE — Progress Notes (Signed)
Chief Complaint: Left knee injection    History of Present Illness:   Presents today for follow-up of her left knee.  She is here today for an injection.  She is having some symptoms and persistent pain about the lateral aspect of the knee.   Surgical History:   None  PMH/PSH/Family History/Social History/Meds/Allergies:    Past Medical History:  Diagnosis Date  . Chlamydia   . Medical history non-contributory    Past Surgical History:  Procedure Laterality Date  . CESAREAN SECTION    . CESAREAN SECTION N/A 06/11/2017   Procedure: REPEAT CESAREAN SECTION;  Surgeon: Tilda Burrow, MD;  Location: [facility] BIRTHING SUITES;  Service: Obstetrics;  Laterality: N/A;  . CHOLECYSTECTOMY    . DIAGNOSTIC LAPAROSCOPY WITH REMOVAL OF ECTOPIC PREGNANCY N/A 12/04/2019   Procedure: DIAGNOSTIC LAPAROSCOPY WITH REMOVAL OF ECTOPIC PREGNANCY;  Surgeon: Malachy Chamber, MD;  Location: [facility] OR;  Service: Gynecology;  Laterality: N/A;  . DILATION AND CURETTAGE OF UTERUS N/A 11/16/2018   Procedure: SUCTION DILATATION AND CURETTAGE;  Surgeon: Lazaro Arms, MD;  Location: [facility];  Service: Gynecology;  Laterality: N/A;  . INDUCED ABORTION  03/10/2022  . LAPAROSCOPIC UNILATERAL SALPINGECTOMY Left 12/04/2019   Procedure: LAPAROSCOPIC LEFT SALPINGECTOMY;  Surgeon: Malachy Chamber, MD;  Location: [facility] OR;  Service: Gynecology;  Laterality: Left;  . WISDOM TOOTH EXTRACTION     Social History   Socioeconomic History  . Marital status: Single    Spouse name: Not on file  . Number of children: 2  . Years of education: Not on file  . Highest education level: Not on file  Occupational History  . Not on file  Tobacco Use  . Smoking status: Former    Types: Cigarettes    Passive exposure: Past  . Smokeless tobacco: Never  Vaping Use  . Vaping Use: Every day  Substance and Sexual Activity  . Alcohol use: No    Comment: occ; not now  . Drug use: No  . Sexual  activity: Not Currently    Birth control/protection: Condom  Other Topics Concern  . Not on file  Social History Narrative  . Not on file   Social Determinants of Health   Financial Resource Strain: Low Risk  (11/15/2018)   Overall Financial Resource Strain (CARDIA)   . Difficulty of Paying Living Expenses: Not hard at all  Food Insecurity: Unknown (11/15/2018)   Hunger Vital Sign   . Worried About Programme researcher, broadcasting/film/video in the Last Year: Patient refused   . Ran Out of Food in the Last Year: Patient refused  Transportation Needs: Unknown (11/15/2018)   PRAPARE - Transportation   . Lack of Transportation (Medical): Patient refused   . Lack of Transportation (Non-Medical): Patient refused  Physical Activity: Insufficiently Active (11/15/2018)   Exercise Vital Sign   . Days of Exercise per Week: 3 days   . Minutes of Exercise per Session: 40 min  Stress: No Stress Concern Present (11/15/2018)   [employer] of Occupational Health - Occupational Stress Questionnaire   . Feeling of Stress : Not at all  Social Connections: Moderately Isolated (11/15/2018)   Social Connection and Isolation Panel [NHANES]   . Frequency of Communication with Friends and Family: More than three times a week   . Frequency of Social Gatherings  with Friends and Family: Once a week   . Attends Religious Services: Never   . Active Member of Clubs or Organizations: No   . Attends Banker Meetings: Never   . Marital Status: Never married   Family History  Problem Relation Age of Onset  . Autism Maternal Uncle   . Cancer Paternal Aunt        breast cancer  . Cancer Paternal Grandmother        breast cancer   Allergies  Allergen Reactions  . Hydrocodone Itching, Nausea And Vomiting and Rash   Current Outpatient Medications  Medication Sig Dispense Refill  . Acetaminophen (TYLENOL PO) Take by mouth as needed. (Patient not taking: Reported on 05/25/2022)    . hydrOXYzine (VISTARIL) 25 MG capsule  Take 1 capsule (25 mg total) by mouth every 8 (eight) hours as needed. 30 capsule 3  . meloxicam (MOBIC) 15 MG tablet Take 1 tablet (15 mg total) by mouth daily. 14 tablet 0  . sertraline (ZOLOFT) 25 MG tablet Take 1 tablet (25 mg total) by mouth daily. 30 tablet 3   No current facility-administered medications for this visit.   No results found.  Review of Systems:   A ROS was performed including pertinent positives and negatives as documented in the HPI.  Physical Exam :   Constitutional: NAD and appears stated age Neurological: Alert and oriented Psych: Appropriate affect and cooperative Last menstrual period 06/18/2022.   Comprehensive Musculoskeletal Exam:    Tenderness palpation about the left dorsal aspect of the foot in the extensor tendons.  She is also tender over the AITFL.  Remainder of distal neurosensory exam is intact  Imaging:   Xray (left foot 3 views): Normal  MRI left knee: There is a anterior lateral meniscal cyst without any evidence of meniscal tearing  I personally reviewed and interpreted the radiographs.   Assessment:   31 y.o. female with left knee pain in the setting of a meniscal cyst.  To that effect I do not see any specific tearing.  I would recommend an ultrasound-guided injection of the knee.  She would like to proceed with this today.  Plan :    -Plan for left knee ultrasound-guided injection after verbal consent obtained    Procedure Note  Patient: Denise Buck             Date of Birth: 12-Nov-1990           MRN: [id number]             Visit Date: 07/17/2022  Procedures: Visit Diagnoses: No diagnosis found.  Large Joint Inj: L knee on 07/17/2022 10:58 AM Indications: pain Details: 22 G 1.5 in needle, ultrasound-guided anterior approach  Arthrogram: No  Medications: 4 mL lidocaine 1 %; 80 mg triamcinolone acetonide 40 MG/ML Outcome: tolerated well, no immediate complications Procedure, treatment alternatives, risks and  benefits explained, specific risks discussed. Consent was given by the patient. Immediately prior to procedure a time out was called to verify the correct patient, procedure, equipment, support staff and site/side marked as required. Patient was prepped and draped in the usual sterile fashion.          I personally saw and evaluated the patient, and participated in the management and treatment plan.  Huel Cote, MD Attending Physician, Orthopedic Surgery  This document was dictated using Dragon voice recognition software. A reasonable attempt at proof reading has been made to minimize errors.

## 2022-07-25 ENCOUNTER — Encounter: Payer: Self-pay | Admitting: Obstetrics & Gynecology

## 2022-07-26 ENCOUNTER — Other Ambulatory Visit (INDEPENDENT_AMBULATORY_CARE_PROVIDER_SITE_OTHER): Payer: Medicaid Other

## 2022-07-26 ENCOUNTER — Other Ambulatory Visit (HOSPITAL_COMMUNITY)
Admission: RE | Admit: 2022-07-26 | Discharge: 2022-07-26 | Disposition: A | Discharge status: Home / Self Care | Payer: Medicaid Other | Source: Ambulatory Visit | Attending: Obstetrics & Gynecology | Admitting: Obstetrics & Gynecology

## 2022-07-26 DIAGNOSIS — N898 Other specified noninflammatory disorders of vagina: Secondary | ICD-10-CM | POA: Insufficient documentation

## 2022-07-26 DIAGNOSIS — R300 Dysuria: Secondary | ICD-10-CM

## 2022-07-26 DIAGNOSIS — Z113 Encounter for screening for infections with a predominantly sexual mode of transmission: Secondary | ICD-10-CM

## 2022-07-26 NOTE — Progress Notes (Signed)
   NURSE VISIT- VAGINITIS/STD/Urine  SUBJECTIVE:  Denise Buck is a 31 y.o. [id number] GYN patient female here for a vaginal swab for vaginitis/STD/urine screening.  She reports the following symptoms: burning, odor and discharge described as white for 3 days. Denies abnormal vaginal bleeding, significant pelvic pain, fever, or UTI symptoms.  OBJECTIVE:  LMP 06/18/2022 (Approximate)   Appears well, in no apparent distress  ASSESSMENT: Vaginal swab for vaginitis screening, STD screening and urine screening  PLAN: Self-collected vaginal probe for Gonorrhea, Chlamydia, Trichomonas, Bacterial Vaginosis, Yeast, urine sample for culture and urinalysis and sent to lab Treatment: to be determined once results are received Follow-up as needed if symptoms persist/worsen, or new symptoms develop  [name]  07/26/2022 4:34 PM

## 2022-07-27 LAB — URINALYSIS, ROUTINE W REFLEX MICROSCOPIC
Bilirubin, UA: NEGATIVE
Glucose, UA: NEGATIVE
Ketones, UA: NEGATIVE
Leukocytes,UA: NEGATIVE
Nitrite, UA: NEGATIVE
Protein,UA: NEGATIVE
RBC, UA: NEGATIVE
Specific Gravity, UA: 1.016 (ref 1.005–1.030)
Urobilinogen, Ur: 0.2 mg/dL (ref 0.2–1.0)
pH, UA: 5.5 (ref 5.0–7.5)

## 2022-07-28 ENCOUNTER — Other Ambulatory Visit: Payer: Self-pay | Admitting: Adult Health

## 2022-07-28 LAB — CERVICOVAGINAL ANCILLARY ONLY
Bacterial Vaginitis (gardnerella): POSITIVE — AB
Candida Glabrata: NEGATIVE
Candida Vaginitis: NEGATIVE
Chlamydia: NEGATIVE
Comment: NEGATIVE
Comment: NEGATIVE
Comment: NEGATIVE
Comment: NEGATIVE
Comment: NEGATIVE
Comment: NORMAL
Neisseria Gonorrhea: NEGATIVE
Trichomonas: NEGATIVE

## 2022-07-28 LAB — URINE CULTURE: Organism ID, Bacteria: NO GROWTH

## 2022-07-28 MED ORDER — METRONIDAZOLE 500 MG PO TABS: 500.0000 mg | ORAL_TABLET | Freq: Two times a day (BID) | ORAL | 0 refills | Status: DC

## 2022-07-28 NOTE — Progress Notes (Signed)
+  BV on swab, rx sent in for flagyl, no sex or alcohol while taking

## 2022-08-24 ENCOUNTER — Encounter: Payer: Self-pay | Admitting: Obstetrics & Gynecology

## 2022-08-30 ENCOUNTER — Ambulatory Visit
Admission: EM | Admit: 2022-08-30 | Discharge: 2022-08-30 | Disposition: A | Discharge status: Home / Self Care | Payer: Medicaid Other | Attending: Urgent Care | Admitting: Urgent Care

## 2022-08-30 DIAGNOSIS — R42 Dizziness and giddiness: Secondary | ICD-10-CM | POA: Diagnosis not present

## 2022-08-30 DIAGNOSIS — N939 Abnormal uterine and vaginal bleeding, unspecified: Secondary | ICD-10-CM

## 2022-08-30 LAB — POCT URINE PREGNANCY: Preg Test, Ur: NEGATIVE

## 2022-08-30 MED ORDER — MEGESTROL ACETATE 40 MG PO TABS: 80.0000 mg | ORAL_TABLET | Freq: Every day | ORAL | 0 refills | Status: DC

## 2022-08-30 NOTE — ED Provider Notes (Signed)
[facility]  Note:  This document was prepared using Systems analyst and may include unintentional dictation errors.  MRN: [id number] DOB: [birth year]  Subjective:   Denise Buck is a 32 y.o. female presenting for 2 week history of persistent heavy vaginal bleeding, passing blood clots.  No contraception currently. Denies fever, n/v, abdominal pain, pelvic pain, rashes, dysuria, urinary frequency, hematuria, vaginal discharge.  Has a history of ectopic pregnancy, salpingectomy of the left side. Would like to have STI check to make sure.   No current facility-administered medications for this encounter.  Current Outpatient Medications:    Acetaminophen (TYLENOL PO), Take by mouth as needed. (Patient not taking: Reported on 05/25/2022), Disp: , Rfl:    hydrOXYzine (VISTARIL) 25 MG capsule, Take 1 capsule (25 mg total) by mouth every 8 (eight) hours as needed., Disp: 30 capsule, Rfl: 3   meloxicam (MOBIC) 15 MG tablet, Take 1 tablet (15 mg total) by mouth daily., Disp: 14 tablet, Rfl: 0   metroNIDAZOLE (FLAGYL) 500 MG tablet, Take 1 tablet (500 mg total) by mouth 2 (two) times daily., Disp: 14 tablet, Rfl: 0   sertraline (ZOLOFT) 25 MG tablet, Take 1 tablet (25 mg total) by mouth daily., Disp: 30 tablet, Rfl: 3   Allergies  Allergen Reactions   Hydrocodone Itching, Nausea And Vomiting and Rash    Past Medical History:  Diagnosis Date   Chlamydia    Medical history non-contributory      Past Surgical History:  Procedure Laterality Date   CESAREAN SECTION     CESAREAN SECTION N/A 06/11/2017   Procedure: REPEAT CESAREAN SECTION;  Surgeon: Jonnie Kind, MD;  Location: [facility];  Service: Obstetrics;  Laterality: N/A;   CHOLECYSTECTOMY     DIAGNOSTIC LAPAROSCOPY WITH REMOVAL OF ECTOPIC PREGNANCY N/A 12/04/2019   Procedure: DIAGNOSTIC LAPAROSCOPY WITH REMOVAL OF ECTOPIC PREGNANCY;  Surgeon: Cherre Blanc, MD;  Location: [facility];  Service: Gynecology;  Laterality: N/A;   DILATION AND CURETTAGE OF UTERUS N/A 11/16/2018   Procedure: SUCTION DILATATION AND CURETTAGE;  Surgeon: Florian Buff, MD;  Location: [facility];  Service: Gynecology;  Laterality: N/A;   INDUCED ABORTION  03/10/2022   LAPAROSCOPIC UNILATERAL SALPINGECTOMY Left 12/04/2019   Procedure: LAPAROSCOPIC LEFT SALPINGECTOMY;  Surgeon: Cherre Blanc, MD;  Location: [place];  Service: Gynecology;  Laterality: Left;   WISDOM TOOTH EXTRACTION      Family History  Problem Relation Age of Onset   Autism Maternal Uncle    Cancer Paternal Aunt        breast cancer   Cancer Paternal Grandmother        breast cancer    Social History   Tobacco Use   Smoking status: Former    Types: Cigarettes    Passive exposure: Past   Smokeless tobacco: Never  Vaping Use   Vaping Use: Every day  Substance Use Topics   Alcohol use: No    Comment: occ; not now   Drug use: No    ROS   Objective:   Vitals: BP 112/67 (BP Location: Right Arm)   Pulse 69   Temp 98.8 F (37.1 C) (Oral)   Resp 16   LMP 08/12/2022   SpO2 97%   Physical Exam Constitutional:      General: She is not in acute distress.    Appearance: Normal appearance. She is well-developed. She is not ill-appearing, toxic-appearing or diaphoretic.  HENT:     Head:  Normocephalic and atraumatic.     Nose: Nose normal.     Mouth/Throat:     Mouth: Mucous membranes are moist.  Eyes:     General: No scleral icterus.       Right eye: No discharge.        Left eye: No discharge.     Extraocular Movements: Extraocular movements intact.     Conjunctiva/sclera: Conjunctivae normal.  Cardiovascular:     Rate and Rhythm: Normal rate.  Pulmonary:     Effort: Pulmonary effort is normal.  Abdominal:     General: Bowel sounds are normal. There is no distension.     Palpations: Abdomen is soft. There is no mass.     Tenderness: There is no abdominal tenderness. There is no right CVA tenderness,  left CVA tenderness, guarding or rebound.  Skin:    General: Skin is warm and dry.  Neurological:     General: No focal deficit present.     Mental Status: She is alert and oriented to person, place, and time.  Psychiatric:        Mood and Affect: Mood normal.        Behavior: Behavior normal.        Thought Content: Thought content normal.        Judgment: Judgment normal.    Results for orders placed or performed during the hospital encounter of 08/30/22 (from the past 24 hour(s))  POCT urine pregnancy     Status: None   Collection Time: 08/30/22  2:57 PM  Result Value Ref Range   Preg Test, Ur Negative Negative    Assessment and Plan :   PDMP not reviewed this encounter.  1. Abnormal uterine bleeding   2. Lightheadedness     Will use Megace. Labs pending. Follow up closely with gynecologist. Counseled patient on potential for adverse effects with medications prescribed/recommended today, ER and return-to-clinic precautions discussed, patient verbalized understanding.    Jaynee Eagles, PA-C 08/30/22 1504

## 2022-08-30 NOTE — ED Triage Notes (Signed)
Pt c/o vaginal bleeding since 12/23-started feeling light headed x today-NAD-steady gait

## 2022-08-31 ENCOUNTER — Telehealth (HOSPITAL_COMMUNITY): Payer: Self-pay | Admitting: Emergency Medicine

## 2022-08-31 ENCOUNTER — Other Ambulatory Visit (HOSPITAL_BASED_OUTPATIENT_CLINIC_OR_DEPARTMENT_OTHER): Payer: Self-pay

## 2022-08-31 ENCOUNTER — Telehealth: Payer: Self-pay

## 2022-08-31 LAB — BASIC METABOLIC PANEL
BUN/Creatinine Ratio: 25 — ABNORMAL HIGH (ref 9–23)
BUN: 20 mg/dL (ref 6–20)
CO2: 16 mmol/L — ABNORMAL LOW (ref 20–29)
Calcium: 9.5 mg/dL (ref 8.7–10.2)
Chloride: 103 mmol/L (ref 96–106)
Creatinine, Ser: 0.81 mg/dL (ref 0.57–1.00)
Glucose: 67 mg/dL — ABNORMAL LOW (ref 70–99)
Potassium: 4.2 mmol/L (ref 3.5–5.2)
Sodium: 138 mmol/L (ref 134–144)
eGFR: 100 mL/min/{1.73_m2} (ref >59)

## 2022-08-31 LAB — CBC WITH DIFFERENTIAL/PLATELET
Basophils Absolute: 0.1 10*3/uL (ref 0.0–0.2)
Basos: 1 %
EOS (ABSOLUTE): 0.1 10*3/uL (ref 0.0–0.4)
Eos: 0 %
Hematocrit: 44.2 % (ref 34.0–46.6)
Hemoglobin: 14.7 g/dL (ref 11.1–15.9)
Immature Grans (Abs): 0.1 10*3/uL (ref 0.0–0.1)
Immature Granulocytes: 1 %
Lymphocytes Absolute: 2.3 10*3/uL (ref 0.7–3.1)
Lymphs: 18 %
MCH: 29.9 pg (ref 26.6–33.0)
MCHC: 33.3 g/dL (ref 31.5–35.7)
MCV: 90 fL (ref 79–97)
Monocytes Absolute: 0.5 10*3/uL (ref 0.1–0.9)
Monocytes: 4 %
Neutrophils Absolute: 9.6 10*3/uL — ABNORMAL HIGH (ref 1.4–7.0)
Neutrophils: 76 %
Platelets: 300 10*3/uL (ref 150–450)
RBC: 4.92 x10E6/uL (ref 3.77–5.28)
RDW: 13.3 % (ref 11.7–15.4)
WBC: 12.6 10*3/uL — ABNORMAL HIGH (ref 3.4–10.8)

## 2022-08-31 LAB — CERVICOVAGINAL ANCILLARY ONLY
Bacterial Vaginitis (gardnerella): POSITIVE — AB
Chlamydia: NEGATIVE
Comment: NEGATIVE
Comment: NEGATIVE
Comment: NEGATIVE
Comment: NORMAL
Neisseria Gonorrhea: NEGATIVE
Trichomonas: NEGATIVE

## 2022-08-31 MED ORDER — METRONIDAZOLE 500 MG PO TABS: 500.0000 mg | ORAL_TABLET | Freq: Two times a day (BID) | ORAL | 0 refills | Status: DC

## 2022-08-31 MED ORDER — METRONIDAZOLE 500 MG PO TABS
500.0000 mg | ORAL_TABLET | Freq: Two times a day (BID) | ORAL | 0 refills | Status: DC
  Filled 2022-08-31: qty 14, 7d supply, fill #0

## 2022-08-31 NOTE — Telephone Encounter (Signed)
Pt called/requested rx flagyl to be sent to [facility]-done

## 2022-09-01 ENCOUNTER — Other Ambulatory Visit (HOSPITAL_COMMUNITY): Payer: Self-pay

## 2022-09-04 ENCOUNTER — Encounter: Payer: Self-pay | Admitting: Adult Health

## 2022-09-04 ENCOUNTER — Ambulatory Visit: Payer: Medicaid Other | Admitting: Adult Health

## 2022-09-04 VITALS — BP 134/86 | HR 82 | Ht 68.0 in | Wt 289.0 lb

## 2022-09-04 DIAGNOSIS — N939 Abnormal uterine and vaginal bleeding, unspecified: Secondary | ICD-10-CM | POA: Diagnosis not present

## 2022-09-04 MED ORDER — MEGESTROL ACETATE 40 MG PO TABS: ORAL_TABLET | ORAL | 0 refills | Status: DC

## 2022-09-04 NOTE — Progress Notes (Signed)
  Subjective:     Patient ID: Denise Buck, female   DOB: January 28, 1991, 32 y.o.   MRN: [id number]  HPI Rhemi is a 32 year old white female,single, [id number], in for bleeding since 08/12/22. Was seen at Urgent Care 08/30/22 HGB was 14.7, was given megace took 2 days and stopped taking after reading side effects, did not have any. CV swab was +BV and taking flagyl now.     Component Value Date/Time   DIAGPAP  04/05/2021 1525    - Negative for intraepithelial lesion or malignancy (NILM)   DIAGPAP  10/04/2017 0000    NEGATIVE FOR INTRAEPITHELIAL LESIONS OR MALIGNANCY.   [place] Negative 04/05/2021 1525   ADEQPAP  04/05/2021 1525    Satisfactory for evaluation; transformation zone component ABSENT.   ADEQPAP  10/04/2017 0000    Satisfactory for evaluation  endocervical/transformation zone component PRESENT.   PCP is Durene Fruits NP  Review of Systems Bleeding since 08/12/22 +clots Reviewed past medical,surgical, social and family history. Reviewed medications and allergies.     Objective:   Physical Exam BP 134/86 (BP Location: Left Arm, Patient Position: Sitting, Cuff Size: Normal)   Pulse 82   Ht 5\' 8"  (1.727 m)   Wt 289 lb (131.1 kg)   LMP 08/12/2022   BMI 43.94 kg/m     Skin warm and dry.Pelvic: external genitalia is normal in appearance no lesions, vagina: +period blood without odor,urethra has no lesions or masses noted, cervix:smooth and bulbous, uterus: normal size, shape and contour, non tender, no masses felt, adnexa: no masses or tenderness noted. Bladder is non tender and no masses felt.  Upstream - 09/04/22 1612       Pregnancy Intention Screening   Does the patient want to become pregnant in the next year? No    Does the patient's partner want to become pregnant in the next year? No    Would the patient like to discuss contraceptive options today? No      Contraception Wrap Up   Current Method Female Condom    End Method Female Condom    Contraception Counseling  Provided No            Examination chaperoned by Celene Squibb LPN  Assessment:     1. Abnormal uterine bleeding Will rx megace Meds ordered this encounter  Medications   megestrol (MEGACE) 40 MG tablet    Sig: Take 3 x 5 days then 2 x 5 days then 1 daily    Dispense:  45 tablet    Refill:  0    Order Specific Question:   Supervising Provider    Answer:   Florian Buff [[id number]]   Discussed options and she wants tubal and ablation Tubal papers signed and handout given on ablation    Plan:     Return 4 weeks for pre op with Dr Nelda Marseille

## 2022-09-07 ENCOUNTER — Ambulatory Visit: Payer: Medicaid Other | Admitting: Adult Health

## 2022-10-02 ENCOUNTER — Ambulatory Visit (INDEPENDENT_AMBULATORY_CARE_PROVIDER_SITE_OTHER): Payer: Medicaid Other | Admitting: Obstetrics & Gynecology

## 2022-10-02 ENCOUNTER — Encounter: Payer: Self-pay | Admitting: Obstetrics & Gynecology

## 2022-10-02 VITALS — BP 130/86 | HR 84 | Ht 66.0 in | Wt 290.0 lb

## 2022-10-02 DIAGNOSIS — N939 Abnormal uterine and vaginal bleeding, unspecified: Secondary | ICD-10-CM

## 2022-10-02 DIAGNOSIS — Z302 Encounter for sterilization: Secondary | ICD-10-CM

## 2022-10-02 NOTE — Progress Notes (Unsigned)
GYN VISIT Patient name: Denise Buck MRN [id number]  Date of birth: [birth year] Chief Complaint:   AUB/ preop appt  History of Present Illness:   Denise Buck is a 32 y.o. [id number] female being seen today for the following concerns:  -AUB and Contraceptive management:  She continues to struggle with AUB, had a heavy period from 12/23 until about 1/15.   Pt was seen in Urgent care due to bleeding and by Electa Sniff on 1/15.   Megace did in fact stop her period, but as soon as she stops the bleeding returns.  In the past, she has tried pills, IUD and Nexplanon. Last time, she was offered patch; however, she is concerned that at her current weight it is not going to be as effective. At this time, she wishes to consider more permanent options as she does not desire another pregnancy.  No evidence of anemia- Last Hgb 14.7- Jan 10th  Reports no other acute complaints     01/10/2022    9:06 AM 10/04/2017    8:45 AM 11/20/2016    9:40 AM  Depression screen PHQ 2/9  Decreased Interest 3 0 0  Down, Depressed, Hopeless 3 0 0  PHQ - 2 Score 6 0 0  Altered sleeping 2  0  Tired, decreased energy 3  0  Change in appetite 3  3  Feeling bad or failure about yourself  2  0  Trouble concentrating 2  0  Moving slowly or fidgety/restless 0  0  Suicidal thoughts 0  0  PHQ-9 Score 18  3  Difficult doing work/chores Extremely dIfficult       Review of Systems:   Pertinent items are noted in HPI Denies fever/chills, dizziness, headaches, visual disturbances, fatigue, shortness of breath, chest pain, abdominal pain, vomiting, no problems with bowel movements, urination, or intercourse unless otherwise stated above.  Pertinent History Reviewed:  Reviewed past medical,surgical, social, obstetrical and family history.  Reviewed problem list, medications and allergies. Physical Assessment:   Vitals:   10/02/22 1525  BP: 130/86  Pulse: 84  Weight: 290 lb (131.5 kg)  Height: 5' 6"$  (1.676 m)   Body mass index is 46.81 kg/m.       Physical Examination:   General appearance: alert, well appearing, and in no distress  Psych: mood appropriate, normal affect  Skin: warm & dry   Cardiovascular: normal heart rate noted  Respiratory: normal respiratory effort, no distress  Abdomen: obese, soft, non-tender, no rebound, no guarding  Pelvic: examination not indicated  Extremities: no edema   Chaperone: N/A    Assessment & Plan:  1) AUB -recommend pelvic US to r/o underlying etiology -if no abnormalities noted- reviewed surgical intervention of endometrial ablation.  Reviewed risk/benefit including 90% improvement of menses. -discussed recovery and recommendation for ~1wk off work -plan to continue Megace until surgery can be scheduled  2) Desires sterilization Patient desires permanent sterilization.  Other reversible forms of contraception were discussed with patient; she declines all other modalities. Risks of procedure discussed with patient including but not limited to: risk of regret, permanence of method, bleeding, infection, injury to surrounding organs and need for additional procedures.  Failure risk of about 1% with increased risk of ectopic gestation if pregnancy occurs was also discussed with patient.  Also discussed possibility of post-tubal pain syndrome. Patient verbalized understanding of these risks and wants to proceed with sterilization.   -Morbid obesity -Prior C-section x 3 -prior ectopic pregnancy   -surgical referral created  Janyth Pupa, DO Attending [place], [facility] [place] for [employer], [place]

## 2022-10-03 ENCOUNTER — Encounter: Payer: Self-pay | Admitting: Obstetrics & Gynecology

## 2022-10-03 ENCOUNTER — Ambulatory Visit (HOSPITAL_BASED_OUTPATIENT_CLINIC_OR_DEPARTMENT_OTHER): Admission: RE | Admit: 2022-10-03 | Payer: Medicaid Other | Source: Ambulatory Visit

## 2022-10-03 MED ORDER — MEGESTROL ACETATE 40 MG PO TABS: 40.0000 mg | ORAL_TABLET | Freq: Every day | ORAL | 1 refills | Status: DC

## 2022-10-04 ENCOUNTER — Ambulatory Visit (HOSPITAL_BASED_OUTPATIENT_CLINIC_OR_DEPARTMENT_OTHER): Payer: Medicaid Other

## 2022-10-11 ENCOUNTER — Ambulatory Visit (HOSPITAL_BASED_OUTPATIENT_CLINIC_OR_DEPARTMENT_OTHER)
Admission: RE | Admit: 2022-10-11 | Discharge: 2022-10-11 | Disposition: A | Discharge status: Home / Self Care | Payer: Medicaid Other | Source: Ambulatory Visit | Attending: Obstetrics & Gynecology | Admitting: Obstetrics & Gynecology

## 2022-10-11 DIAGNOSIS — D259 Leiomyoma of uterus, unspecified: Secondary | ICD-10-CM | POA: Diagnosis not present

## 2022-10-11 DIAGNOSIS — N939 Abnormal uterine and vaginal bleeding, unspecified: Secondary | ICD-10-CM | POA: Diagnosis not present

## 2022-10-11 NOTE — Patient Instructions (Signed)
Denise Buck  10/11/2022     @[email or web address]$ @   Your procedure is scheduled on  10/17/2022.   Report to [facility] at  0600  A.M.   Call this number if you have problems the morning of surgery:  [phone]  If you experience any cold or flu symptoms such as cough, fever, chills, shortness of breath, etc. between now and your scheduled surgery, please notify us at the above number.   Remember:  Do not eat or drink after midnight.      Take these medicines the morning of surgery with A SIP OF WATER                                            None.    Do not wear jewelry, make-up or nail polish.  Do not wear lotions, powders, or perfumes, or deodorant.  Do not shave 48 hours prior to surgery.  Men may shave face and neck.  Do not bring valuables to the hospital.  [facility] is not responsible for any belongings or valuables.  Contacts, dentures or bridgework may not be worn into surgery.  Leave your suitcase in the car.  After surgery it may be brought to your room.  For patients admitted to the hospital, discharge time will be determined by your treatment team.  Patients discharged the day of surgery will not be allowed to drive home and must have someone with them for 24 hours.    Special instructions:   DO NOT smoke tobacco or vape for 24 hours before your procedure.  Please read over the following fact sheets that you were given. Pain Booklet, Coughing and Deep Breathing, Surgical Site Infection Prevention, Anesthesia Post-op Instructions, and Care and Recovery After Surgery         Dilation and Curettage or Vacuum Curettage, Care After The following information offers guidance on how to care for yourself after your procedure. Your doctor may also give you more specific instructions. If you have problems or questions, contact your doctor. What can I expect after the procedure? After the procedure, it is common to have: Mild pain or  cramps. Some bleeding or spotting from the vagina. These may last for up to 2 weeks. Follow these instructions at home: Medicines Take over-the-counter and prescription medicines only as told by your doctor. If told, take steps to prevent problems with pooping (constipation). You may need to: Drink enough fluid to keep your pee (urine) pale yellow. Take medicines. You will be told what medicines to take. Eat foods that are high in fiber. These include beans, whole grains, and fresh fruits and vegetables. Limit foods that are high in fat and sugar. These include fried or sweet foods. Ask your doctor if you should avoid driving or using machines while you are taking your medicine. Activity  If you were given a medicine to help you relax (sedative) during your procedure, it can affect you for many hours. Do not drive or use machinery until your doctor says that it is safe. Rest as told by your doctor. Get up to take short walks every 1-2 hours. Ask for help if you feel weak or unsteady. Do not lift anything that is heavier than 10 lb (4.5 kg), or the limit that you are told. Return to your normal activities when your doctor says that  it is safe. Lifestyle For at least 2 weeks, or as long as told by your doctor: Do not douche. Do not use tampons. Do not have sex. General instructions Do not take baths, swim, or use a hot tub. Ask your doctor if you may take showers. Do not smoke or use any products that contain nicotine or tobacco. These can delay healing. If you need help quitting, ask your doctor. Wear compression stockings as told by your doctor. It is up to you to get the results of your procedure. Ask how to get your results when they are ready. Keep all follow-up visits. Contact a doctor if: You have very bad cramps that get worse or do not get better with medicine. You have very bad pain in your belly (abdomen). You cannot drink fluids without vomiting. You have pain in the area  just above your thighs. You have fluid from your vagina that smells bad. You have a rash. Get help right away if: You are bleeding a lot from your vagina. This means soaking more than one sanitary pad in 1 hour, and this happens for 2 hours in a row. You have a fever that is above 100.79F (38C). Your belly feels very tender or hard. You have chest pain. You have trouble breathing. You feel dizzy or light-headed. You faint. You have pain in your neck or shoulder area. These symptoms may be an emergency. Get help right away. Call your local emergency services (911 in the [place] Do not wait to see if the symptoms will go away. Do not drive yourself to the hospital. Summary After your procedure, it is common to have pain or cramping. It is also common to have bleeding or spotting from your vagina. Rest as told. Get up to take short walks every 1-2 hours. Do not lift anything that is heavier than 10 lb (4.5 kg), or the limit that you are told. Get help right away if you have problems from the procedure. Ask your doctor what problems to watch for. This information is not intended to replace advice given to you by your health care provider. Make sure you discuss any questions you have with your health care provider. Document Revised: 07/28/2020 Document Reviewed: 07/28/2020 Elsevier Patient Education  [place] After The following information offers guidance on how to care for yourself after your procedure. Your health care provider may also give you more specific instructions. If you have problems or questions, contact your health care provider. What can I expect after the procedure? After the procedure, it is common to have: Pain in your abdomen. Light vaginal bleeding (spotting) for a few days. Tiredness. Your recovery time will depend on which method was used for your surgery. Follow these instructions at home: Medicines Take over-the-counter and  prescription medicines only as told by your health care provider. Ask your health care provider if the medicine prescribed to you: Requires you to avoid driving or using machinery. Can cause constipation. You may need to take actions to prevent or treat constipation, such as: Drink enough fluid to keep your urine pale yellow. Take over-the-counter or prescription medicines. Eat foods that are high in fiber, such as beans, whole grains, and fresh fruits and vegetables. Limit foods that are high in fat and processed sugars, such as fried or sweet foods. Incision care  Follow instructions from your health care provider about how to take care of your incision or incisions. Make sure you: Wash your hands with soap  and water for at least 20 seconds before and after you change your bandage (dressing). If soap and water are not available, use hand sanitizer. Change or remove your dressing as told by your health care provider. Leave stitches (sutures), skin glue, staples, or adhesive strips in place. These skin closures may need to stay in place for 2 weeks or longer. If adhesive strip edges start to loosen and curl up, you may trim the loose edges. Do not remove adhesive strips completely unless your health care provider tells you to do that. Keep your dressing clean and dry. Check your incision area every day for signs of infection. Check for: Redness, swelling, or pain that gets worse. Fluid or blood. Warmth. Pus or a bad smell. Activity Rest as told by your health care provider. Avoid sitting for a long time without moving. Get up to take short walks every 1-2 hours. This is important to improve blood flow and breathing. Ask for help if you feel weak or unsteady. Return to your normal activities as told by your health care provider. Ask your health care provider what activities are safe for you. Do not drive until your health care provider says that it is safe. Do not lift anything that is  heavier than 10 lb (4.5 kg), or the limit that you are told, until your health care provider says that it is safe. This may last for 2-6 weeks depending on your surgery. Do not douche, use tampons, or have sex until your health care provider approves. General instructions Do not use any products that contain nicotine or tobacco. These products include cigarettes, chewing tobacco, and vaping devices, such as e-cigarettes. These can delay healing after surgery. If you need help quitting, ask your health care provider. Wear compression stockings as told by your health care provider. These stockings help to prevent blood clots and reduce swelling in your legs. Do not take baths, swim, or use a hot tub until your health care provider approves. You may take showers. Keep all follow-up visits. This is important. Contact a health care provider if: You have pain when you urinate. You have redness, swelling, or more pain around an incision or an incision feels warm to the touch. You have pus, fluid, blood, or a bad smell coming from an incision or an incision starts to open. You have a fever. You have abdominal pain that gets worse or does not get better with medicine. You have a rash. You feel light-headed, have nausea and vomiting, or both. Get help right away if: You have pain in your chest or leg. You develop shortness of breath. You faint. You have increased or heavy vaginal bleeding, such as soaking a sanitary napkin in an hour. These symptoms may represent a serious problem that is an emergency. Do not wait to see if the symptoms will go away. Get medical help right away. Call your local emergency services (911 in the [place] Do not drive yourself to the hospital. Summary After the procedure, it is common to feel tired, have pain in your abdomen, and have light vaginal bleeding for a few days. Follow instructions from your health care provider about how to take care of your incision or  incisions. Return to your normal activities as told by your health care provider. Ask your health care provider what activities are safe for you. Do not douche, use tampons, or have sex until your health care provider approves. Keep all follow-up visits. This is important. This information is  not intended to replace advice given to you by your health care provider. Make sure you discuss any questions you have with your health care provider. Document Revised: 06/29/2020 Document Reviewed: 06/29/2020 Elsevier Patient Education  [place] Anesthesia, Adult, Care After The following information offers guidance on how to care for yourself after your procedure. Your health care provider may also give you more specific instructions. If you have problems or questions, contact your health care provider. What can I expect after the procedure? After the procedure, it is common for people to: Have pain or discomfort at the IV site. Have nausea or vomiting. Have a sore throat or hoarseness. Have trouble concentrating. Feel cold or chills. Feel weak, sleepy, or tired (fatigue). Have soreness and body aches. These can affect parts of the body that were not involved in surgery. Follow these instructions at home: For the time period you were told by your health care provider:  Rest. Do not participate in activities where you could fall or become injured. Do not drive or use machinery. Do not drink alcohol. Do not take sleeping pills or medicines that cause drowsiness. Do not make important decisions or sign legal documents. Do not take care of children on your own. General instructions Drink enough fluid to keep your urine pale yellow. If you have sleep apnea, surgery and certain medicines can increase your risk for breathing problems. Follow instructions from your health care provider about wearing your sleep device: Anytime you are sleeping, including during daytime naps. While  taking prescription pain medicines, sleeping medicines, or medicines that make you drowsy. Return to your normal activities as told by your health care provider. Ask your health care provider what activities are safe for you. Take over-the-counter and prescription medicines only as told by your health care provider. Do not use any products that contain nicotine or tobacco. These products include cigarettes, chewing tobacco, and vaping devices, such as e-cigarettes. These can delay incision healing after surgery. If you need help quitting, ask your health care provider. Contact a health care provider if: You have nausea or vomiting that does not get better with medicine. You vomit every time you eat or drink. You have pain that does not get better with medicine. You cannot urinate or have bloody urine. You develop a skin rash. You have a fever. Get help right away if: You have trouble breathing. You have chest pain. You vomit blood. These symptoms may be an emergency. Get help right away. Call 911. Do not wait to see if the symptoms will go away. Do not drive yourself to the hospital. Summary After the procedure, it is common to have a sore throat, hoarseness, nausea, vomiting, or to feel weak, sleepy, or fatigue. For the time period you were told by your health care provider, do not drive or use machinery. Get help right away if you have difficulty breathing, have chest pain, or vomit blood. These symptoms may be an emergency. This information is not intended to replace advice given to you by your health care provider. Make sure you discuss any questions you have with your health care provider. Document Revised: 11/04/2021 Document Reviewed: 11/04/2021 Elsevier Patient Education  [place]. How to Use Chlorhexidine Before Surgery Chlorhexidine gluconate (CHG) is a germ-killing (antiseptic) solution that is used to clean the skin. It can get rid of the bacteria that normally live on  the skin and can keep them away for about 24 hours. To clean your skin with  CHG, you may be given: A CHG solution to use in the shower or as part of a sponge bath. A prepackaged cloth that contains CHG. Cleaning your skin with CHG may help lower the risk for infection: While you are staying in the intensive care unit of the hospital. If you have a vascular access, such as a central line, to provide short-term or long-term access to your veins. If you have a catheter to drain urine from your bladder. If you are on a ventilator. A ventilator is a machine that helps you breathe by moving air in and out of your lungs. After surgery. What are the risks? Risks of using CHG include: A skin reaction. Hearing loss, if CHG gets in your ears and you have a perforated eardrum. Eye injury, if CHG gets in your eyes and is not rinsed out. The CHG product catching fire. Make sure that you avoid smoking and flames after applying CHG to your skin. Do not use CHG: If you have a chlorhexidine allergy or have previously reacted to chlorhexidine. On babies younger than 3 months of age. How to use CHG solution Use CHG only as told by your health care provider, and follow the instructions on the label. Use the full amount of CHG as directed. Usually, this is one bottle. During a shower Follow these steps when using CHG solution during a shower (unless your health care provider gives you different instructions): Start the shower. Use your normal soap and shampoo to wash your face and hair. Turn off the shower or move out of the shower stream. Pour the CHG onto a clean washcloth. Do not use any type of brush or rough-edged sponge. Starting at your neck, lather your body down to your toes. Make sure you follow these instructions: If you will be having surgery, pay special attention to the part of your body where you will be having surgery. Scrub this area for at least 1 minute. Do not use CHG on your head or  face. If the solution gets into your ears or eyes, rinse them well with water. Avoid your genital area. Avoid any areas of skin that have broken skin, cuts, or scrapes. Scrub your back and under your arms. Make sure to wash skin folds. Let the lather sit on your skin for 1-2 minutes or as long as told by your health care provider. Thoroughly rinse your entire body in the shower. Make sure that all body creases and crevices are rinsed well. Dry off with a clean towel. Do not put any substances on your body afterward--such as powder, lotion, or perfume--unless you are told to do so by your health care provider. Only use lotions that are recommended by the manufacturer. Put on clean clothes or pajamas. If it is the night before your surgery, sleep in clean sheets.  During a sponge bath Follow these steps when using CHG solution during a sponge bath (unless your health care provider gives you different instructions): Use your normal soap and shampoo to wash your face and hair. Pour the CHG onto a clean washcloth. Starting at your neck, lather your body down to your toes. Make sure you follow these instructions: If you will be having surgery, pay special attention to the part of your body where you will be having surgery. Scrub this area for at least 1 minute. Do not use CHG on your head or face. If the solution gets into your ears or eyes, rinse them well with water. Avoid your  genital area. Avoid any areas of skin that have broken skin, cuts, or scrapes. Scrub your back and under your arms. Make sure to wash skin folds. Let the lather sit on your skin for 1-2 minutes or as long as told by your health care provider. Using a different clean, wet washcloth, thoroughly rinse your entire body. Make sure that all body creases and crevices are rinsed well. Dry off with a clean towel. Do not put any substances on your body afterward--such as powder, lotion, or perfume--unless you are told to do so by your  health care provider. Only use lotions that are recommended by the manufacturer. Put on clean clothes or pajamas. If it is the night before your surgery, sleep in clean sheets. How to use CHG prepackaged cloths Only use CHG cloths as told by your health care provider, and follow the instructions on the label. Use the CHG cloth on clean, dry skin. Do not use the CHG cloth on your head or face unless your health care provider tells you to. When washing with the CHG cloth: Avoid your genital area. Avoid any areas of skin that have broken skin, cuts, or scrapes. Before surgery Follow these steps when using a CHG cloth to clean before surgery (unless your health care provider gives you different instructions): Using the CHG cloth, vigorously scrub the part of your body where you will be having surgery. Scrub using a back-and-forth motion for 3 minutes. The area on your body should be completely wet with CHG when you are done scrubbing. Do not rinse. Discard the cloth and let the area air-dry. Do not put any substances on the area afterward, such as powder, lotion, or perfume. Put on clean clothes or pajamas. If it is the night before your surgery, sleep in clean sheets.  For general bathing Follow these steps when using CHG cloths for general bathing (unless your health care provider gives you different instructions). Use a separate CHG cloth for each area of your body. Make sure you wash between any folds of skin and between your fingers and toes. Wash your body in the following order, switching to a new cloth after each step: The front of your neck, shoulders, and chest. Both of your arms, under your arms, and your hands. Your stomach and groin area, avoiding the genitals. Your right leg and foot. Your left leg and foot. The back of your neck, your back, and your buttocks. Do not rinse. Discard the cloth and let the area air-dry. Do not put any substances on your body afterward--such as  powder, lotion, or perfume--unless you are told to do so by your health care provider. Only use lotions that are recommended by the manufacturer. Put on clean clothes or pajamas. Contact a health care provider if: Your skin gets irritated after scrubbing. You have questions about using your solution or cloth. You swallow any chlorhexidine. Call your local poison control center (1-(765)695-9500 in the [place] Get help right away if: Your eyes itch badly, or they become very red or swollen. Your skin itches badly and is red or swollen. Your hearing changes. You have trouble seeing. You have swelling or tingling in your mouth or throat. You have trouble breathing. These symptoms may represent a serious problem that is an emergency. Do not wait to see if the symptoms will go away. Get medical help right away. Call your local emergency services (911 in the [place] Do not drive yourself to the hospital. Summary Chlorhexidine gluconate (CHG) is  a germ-killing (antiseptic) solution that is used to clean the skin. Cleaning your skin with CHG may help to lower your risk for infection. You may be given CHG to use for bathing. It may be in a bottle or in a prepackaged cloth to use on your skin. Carefully follow your health care provider's instructions and the instructions on the product label. Do not use CHG if you have a chlorhexidine allergy. Contact your health care provider if your skin gets irritated after scrubbing. This information is not intended to replace advice given to you by your health care provider. Make sure you discuss any questions you have with your health care provider. Document Revised: 12/05/2021 Document Reviewed: 10/18/2020 Elsevier Patient Education  [place].

## 2022-10-12 ENCOUNTER — Encounter: Payer: Self-pay | Admitting: Obstetrics & Gynecology

## 2022-10-12 ENCOUNTER — Encounter (HOSPITAL_COMMUNITY)
Admission: RE | Admit: 2022-10-12 | Discharge: 2022-10-12 | Disposition: A | Discharge status: Home / Self Care | Payer: Medicaid Other | Source: Ambulatory Visit | Attending: Obstetrics & Gynecology | Admitting: Obstetrics & Gynecology

## 2022-10-12 VITALS — BP 128/84 | HR 74 | Temp 98.5°F | Resp 16 | Ht 66.0 in | Wt 290.0 lb

## 2022-10-12 DIAGNOSIS — Z975 Presence of (intrauterine) contraceptive device: Secondary | ICD-10-CM | POA: Insufficient documentation

## 2022-10-12 DIAGNOSIS — Z01818 Encounter for other preprocedural examination: Secondary | ICD-10-CM | POA: Insufficient documentation

## 2022-10-12 DIAGNOSIS — Z3009 Encounter for other general counseling and advice on contraception: Secondary | ICD-10-CM

## 2022-10-12 DIAGNOSIS — N939 Abnormal uterine and vaginal bleeding, unspecified: Secondary | ICD-10-CM | POA: Diagnosis not present

## 2022-10-12 DIAGNOSIS — R638 Other symptoms and signs concerning food and fluid intake: Secondary | ICD-10-CM | POA: Diagnosis not present

## 2022-10-12 DIAGNOSIS — N921 Excessive and frequent menstruation with irregular cycle: Secondary | ICD-10-CM | POA: Insufficient documentation

## 2022-10-12 LAB — COMPREHENSIVE METABOLIC PANEL
ALT: 20 U/L (ref 0–44)
AST: 17 U/L (ref 15–41)
Albumin: 4.1 g/dL (ref 3.5–5.0)
Alkaline Phosphatase: 60 U/L (ref 38–126)
Anion gap: 9 (ref 5–15)
BUN: 15 mg/dL (ref 6–20)
CO2: 15 mmol/L — ABNORMAL LOW (ref 22–32)
Calcium: 9.2 mg/dL (ref 8.9–10.3)
Chloride: 110 mmol/L (ref 98–111)
Creatinine, Ser: 0.75 mg/dL (ref 0.44–1.00)
GFR, Estimated: >60 mL/min (ref >60)
Glucose, Bld: 85 mg/dL (ref 70–99)
Potassium: 4 mmol/L (ref 3.5–5.1)
Sodium: 134 mmol/L — ABNORMAL LOW (ref 135–145)
Total Bilirubin: 0.9 mg/dL (ref 0.3–1.2)
Total Protein: 7.3 g/dL (ref 6.5–8.1)

## 2022-10-12 LAB — TYPE AND SCREEN
ABO/RH(D): O POS
Antibody Screen: NEGATIVE

## 2022-10-12 LAB — CBC
HCT: 42.3 % (ref 36.0–46.0)
Hemoglobin: 14.1 g/dL (ref 12.0–15.0)
MCH: 29.7 pg (ref 26.0–34.0)
MCHC: 33.3 g/dL (ref 30.0–36.0)
MCV: 89.1 fL (ref 80.0–100.0)
Platelets: 266 10*3/uL (ref 150–400)
RBC: 4.75 MIL/uL (ref 3.87–5.11)
RDW: 12.7 % (ref 11.5–15.5)
WBC: 7.5 10*3/uL (ref 4.0–10.5)
nRBC: 0 % (ref 0.0–0.2)

## 2022-10-12 LAB — POCT PREGNANCY, URINE: Preg Test, Ur: NEGATIVE

## 2022-10-13 ENCOUNTER — Encounter (HOSPITAL_COMMUNITY): Payer: Medicaid Other

## 2022-10-15 NOTE — H&P (Signed)
[facility] Attending History and Physical  Denise Buck is a 32 y.o. [id number] who presents for scheduled endometrial ablation and right salpingectomy- prior left salpingectomy already completed  In review, she struggles with AUB for many years.  In the past, she has tried pills, IUD and Nexplanon.  She was on Megace, which has stopped her period.  She stopped this about a week ago due to side effects and has started to have some moderate bleeding.  She desires a long term option to regulate her period as she does not want another pregnancy.  Denies any abnormal vaginal discharge, fevers, chills, sweats, dysuria, nausea, vomiting, other GI or GU symptoms or other general symptoms.  Past Medical History:  Diagnosis Date   Chlamydia    Medical history non-contributory    Past Surgical History:  Procedure Laterality Date   CESAREAN SECTION     CESAREAN SECTION N/A 06/11/2017   Procedure: REPEAT CESAREAN SECTION;  Surgeon: Jonnie Kind, MD;  Location: [facility];  Service: Obstetrics;  Laterality: N/A;   CHOLECYSTECTOMY     DIAGNOSTIC LAPAROSCOPY WITH REMOVAL OF ECTOPIC PREGNANCY N/A 12/04/2019   Procedure: DIAGNOSTIC LAPAROSCOPY WITH REMOVAL OF ECTOPIC PREGNANCY;  Surgeon: Cherre Blanc, MD;  Location: [facility];  Service: Gynecology;  Laterality: N/A;   DILATION AND CURETTAGE OF UTERUS N/A 11/16/2018   Procedure: SUCTION DILATATION AND CURETTAGE;  Surgeon: Florian Buff, MD;  Location: [facility];  Service: Gynecology;  Laterality: N/A;   INDUCED ABORTION  03/10/2022   LAPAROSCOPIC UNILATERAL SALPINGECTOMY Left 12/04/2019   Procedure: LAPAROSCOPIC LEFT SALPINGECTOMY;  Surgeon: Cherre Blanc, MD;  Location: [facility];  Service: Gynecology;  Laterality: Left;   WISDOM TOOTH EXTRACTION     OB History  Gravida Para Term Preterm AB Living  '6 3 2 1 3 2  '$ SAB IAB Ectopic Multiple Live Births  '1 1 1 '$ 0 2    # Outcome Date GA Lbr Len/2nd Weight Sex  Delivery Anes PTL Lv  6 IAB 03/10/22 [redacted]w[redacted]d        5 Term 06/11/17 [id number] g M CS-LTranv Spinal  LIV  4 Preterm 01/14/15 2329w0d VBAC EPI Y ND  3 Term 06/27/11 [id number] g M CS-LTranv EPI N LIV     Complications: Failure to Progress in First Stage  2 Ectopic           1 SAB           Patient denies any other pertinent gynecologic issues.  No current facility-administered medications on file prior to encounter.   Current Outpatient Medications on File Prior to Encounter  Medication Sig Dispense Refill   Acetaminophen (TYLENOL PO) Take by mouth as needed. (Patient not taking: Reported on 05/25/2022)     hydrOXYzine (VISTARIL) 25 MG capsule Take 1 capsule (25 mg total) by mouth every 8 (eight) hours as needed. (Patient not taking: Reported on 09/04/2022) 30 capsule 3   megestrol (MEGACE) 40 MG tablet Take 1 tablet (40 mg total) by mouth daily. Take 3 x 5 days then 2 x 5 days then 1 daily 30 tablet 1   meloxicam (MOBIC) 15 MG tablet Take 1 tablet (15 mg total) by mouth daily. (Patient not taking: Reported on 09/04/2022) 14 tablet 0   metroNIDAZOLE (FLAGYL) 500 MG tablet Take 1 tablet (500 mg total) by mouth 2 (two) times daily. (Patient not taking: Reported on 10/02/2022) 14 tablet 0   sertraline (ZOLOFT)  25 MG tablet Take 1 tablet (25 mg total) by mouth daily. (Patient not taking: Reported on 09/04/2022) 30 tablet 3   Allergies  Allergen Reactions   Hydrocodone Itching, Nausea And Vomiting and Rash    Social History:   reports that she has quit smoking. Her smoking use included cigarettes. She has been exposed to tobacco smoke. She has never used smokeless tobacco. She reports that she does not drink alcohol and does not use drugs. Family History  Problem Relation Age of Onset   Autism Maternal Uncle    Cancer Paternal Aunt        breast cancer   Cancer Paternal Grandmother        breast cancer    Review of Systems: Pertinent items noted in HPI and remainder of comprehensive ROS  otherwise negative.  PHYSICAL EXAM: VS: Pending  CONSTITUTIONAL: Well-developed, well-nourished female in no acute distress.  SKIN: Skin is warm and dry. No rash noted. Not diaphoretic. No erythema. No pallor. NEUROLOGIC: Alert and oriented to person, place, and time. Normal reflexes, muscle tone coordination. No cranial nerve deficit noted. PSYCHIATRIC: Normal mood and affect. Normal behavior. Normal judgment and thought content. CARDIOVASCULAR: Normal heart rate noted, regular rhythm RESPIRATORY: Effort and breath sounds normal, no problems with respiration noted ABDOMEN: Soft, nontender, nondistended. PELVIC: deferred MUSCULOSKELETAL: no calf tenderness bilaterally EXT: no edema bilaterally, normal pulses  Labs: Results for orders placed or performed during the hospital encounter of 10/12/22 (from the past 336 hour(s))  CBC   Collection Time: 10/12/22 10:40 AM  Result Value Ref Range   WBC 7.5 4.0 - 10.5 K/uL   RBC 4.75 3.87 - 5.11 MIL/uL   Hemoglobin 14.1 12.0 - 15.0 g/dL   HCT 42.3 36.0 - 46.0 %   MCV 89.1 80.0 - 100.0 fL   MCH 29.7 26.0 - 34.0 pg   MCHC 33.3 30.0 - 36.0 g/dL   RDW 12.7 11.5 - 15.5 %   Platelets 266 150 - 400 K/uL   nRBC 0.0 0.0 - 0.2 %  Comprehensive metabolic panel   Collection Time: 10/12/22 10:40 AM  Result Value Ref Range   Sodium 134 (L) 135 - 145 mmol/L   Potassium 4.0 3.5 - 5.1 mmol/L   Chloride 110 98 - 111 mmol/L   CO2 15 (L) 22 - 32 mmol/L   Glucose, Bld 85 70 - 99 mg/dL   BUN 15 6 - 20 mg/dL   Creatinine, Ser 0.75 0.44 - 1.00 mg/dL   Calcium 9.2 8.9 - 10.3 mg/dL   Total Protein 7.3 6.5 - 8.1 g/dL   Albumin 4.1 3.5 - 5.0 g/dL   AST 17 15 - 41 U/L   ALT 20 0 - 44 U/L   Alkaline Phosphatase 60 38 - 126 U/L   Total Bilirubin 0.9 0.3 - 1.2 mg/dL   GFR, Estimated >60 >60 mL/min   Anion gap 9 5 - 15  Type and screen   Collection Time: 10/12/22 10:40 AM  Result Value Ref Range   ABO/RH(D) O POS    Antibody Screen NEG    Sample  Expiration 10/15/2022,2359    Extend sample reason      NO TRANSFUSIONS OR PREGNANCY IN THE PAST 3 MONTHS Performed at [facility], [address] [place],  [address]   Pregnancy, urine POC   Collection Time: 10/12/22 10:53 AM  Result Value Ref Range   Preg Test, Ur NEGATIVE NEGATIVE    Imaging Studies: US PELVIC COMPLETE WITH TRANSVAGINAL  Result Date: 10/11/2022 CLINICAL DATA:  Abnormal uterine bleeding for many years, past history of D&C, LEFT salpingectomy, Caesarean section, had bleeding throughout January and some in February, specific LMP unknown EXAM: TRANSABDOMINAL AND TRANSVAGINAL ULTRASOUND OF PELVIS TECHNIQUE: Both transabdominal and transvaginal ultrasound examinations of the pelvis were performed. Transabdominal technique was performed for global imaging of the pelvis including uterus, ovaries, adnexal regions, and pelvic cul-de-sac. It was necessary to proceed with endovaginal exam following the transabdominal exam to visualize the endometrium and ovaries. COMPARISON:  04/29/2021 FINDINGS: Uterus Measurements: 9.9 x 4.8 x 5.6 cm = volume: 139 mL. Anteverted. Normal morphology without mass Endometrium Thickness: 7 mm.  No endometrial fluid or mass Right ovary Measurements: 2.1 x 1.3 x 1.8 cm = volume: 2.6 mL. Suboptimally visualized due to overlying bowel. No gross mass. Left ovary Not visualized, likely obscured by bowel Other findings No free pelvic fluid or adnexal masses. IMPRESSION: Nonvisualization of LEFT ovary. Otherwise normal exam. Electronically Signed   By: Lavonia Dana M.D.   On: 10/11/2022 16:30    Assessment: Desires permanent sterilization Abnormal uterine bleeding   Plan: Hysteroscopy, D&C, Endometrial ablation Laparoscopic right salpingectomy -NPO -LR @ 125cc/hr -SCDs to OR -IV Toradol  Patient desires permanent sterilization.  Other reversible forms of contraception were discussed with patient; she declines all other modalities. Risks of procedure  discussed with patient including but not limited to: risk of regret, permanence of method, bleeding, infection, injury to surrounding organs and need for additional procedures.  Reviewed plan for endometrial ablation- discussed risk/benefit including but not limited to risk of infection, failure of ablation or uterine perforation with potential injury to surrounding organs.  Patient verbalized understanding of these risks and wants to proceed.  Janyth Pupa, DO Attending [place], [facility] for [employer], [place]

## 2022-10-17 ENCOUNTER — Encounter (HOSPITAL_COMMUNITY)
Admission: RE | Disposition: A | Discharge status: Home / Self Care | Payer: Self-pay | Source: Home / Self Care | Attending: Obstetrics & Gynecology

## 2022-10-17 ENCOUNTER — Ambulatory Visit (HOSPITAL_COMMUNITY): Payer: Medicaid Other | Admitting: Anesthesiology

## 2022-10-17 ENCOUNTER — Other Ambulatory Visit: Payer: Self-pay

## 2022-10-17 ENCOUNTER — Encounter (HOSPITAL_COMMUNITY): Payer: Self-pay | Admitting: Obstetrics & Gynecology

## 2022-10-17 ENCOUNTER — Ambulatory Visit (HOSPITAL_BASED_OUTPATIENT_CLINIC_OR_DEPARTMENT_OTHER): Payer: Medicaid Other | Admitting: Anesthesiology

## 2022-10-17 ENCOUNTER — Ambulatory Visit (HOSPITAL_COMMUNITY)
Admission: RE | Admit: 2022-10-17 | Discharge: 2022-10-17 | Disposition: A | Discharge status: Home / Self Care | Payer: Medicaid Other | Attending: Obstetrics & Gynecology | Admitting: Obstetrics & Gynecology

## 2022-10-17 DIAGNOSIS — Z6841 Body Mass Index (BMI) 40.0 and over, adult: Secondary | ICD-10-CM | POA: Diagnosis not present

## 2022-10-17 DIAGNOSIS — N939 Abnormal uterine and vaginal bleeding, unspecified: Secondary | ICD-10-CM

## 2022-10-17 DIAGNOSIS — Z3009 Encounter for other general counseling and advice on contraception: Secondary | ICD-10-CM | POA: Diagnosis present

## 2022-10-17 DIAGNOSIS — N8000 Endometriosis of the uterus, unspecified: Secondary | ICD-10-CM

## 2022-10-17 DIAGNOSIS — Z01818 Encounter for other preprocedural examination: Secondary | ICD-10-CM

## 2022-10-17 DIAGNOSIS — N920 Excessive and frequent menstruation with regular cycle: Secondary | ICD-10-CM | POA: Insufficient documentation

## 2022-10-17 DIAGNOSIS — Z87891 Personal history of nicotine dependence: Secondary | ICD-10-CM | POA: Insufficient documentation

## 2022-10-17 DIAGNOSIS — C711 Malignant neoplasm of frontal lobe: Secondary | ICD-10-CM | POA: Diagnosis not present

## 2022-10-17 DIAGNOSIS — N711 Chronic inflammatory disease of uterus: Secondary | ICD-10-CM | POA: Diagnosis not present

## 2022-10-17 DIAGNOSIS — Z302 Encounter for sterilization: Secondary | ICD-10-CM

## 2022-10-17 DIAGNOSIS — Z9079 Acquired absence of other genital organ(s): Secondary | ICD-10-CM | POA: Diagnosis not present

## 2022-10-17 HISTORY — PX: DILITATION & CURRETTAGE/HYSTROSCOPY WITH HYDROTHERMAL ABLATION: SHX5570

## 2022-10-17 HISTORY — PX: LAPAROSCOPIC UNILATERAL SALPINGECTOMY: SHX5934

## 2022-10-17 SURGERY — DILATATION & CURETTAGE/HYSTEROSCOPY WITH HYDROTHERMAL ABLATION
Anesthesia: General | Site: Vagina | Laterality: Right

## 2022-10-17 MED ORDER — DEXAMETHASONE SODIUM PHOSPHATE 10 MG/ML IJ SOLN
INTRAMUSCULAR | Status: DC | PRN
  Administered 2022-10-17: 10 mg via INTRAVENOUS

## 2022-10-17 MED ORDER — ROCURONIUM 10MG/ML (10ML) SYRINGE FOR MEDFUSION PUMP - OPTIME
INTRAVENOUS | Status: DC | PRN
  Administered 2022-10-17: 10 mg via INTRAVENOUS
  Administered 2022-10-17: 70 mg via INTRAVENOUS

## 2022-10-17 MED ORDER — HYDROMORPHONE HCL 1 MG/ML IJ SOLN
INTRAMUSCULAR | Status: AC
  Filled 2022-10-17: qty 0.5

## 2022-10-17 MED ORDER — IBUPROFEN 600 MG PO TABS: 600.0000 mg | ORAL_TABLET | Freq: Four times a day (QID) | ORAL | 0 refills | Status: DC | PRN

## 2022-10-17 MED ORDER — POVIDONE-IODINE 10 % EX SWAB: 2.0000 | Freq: Once | CUTANEOUS | Status: DC

## 2022-10-17 MED ORDER — MEPERIDINE HCL 50 MG/ML IJ SOLN: 6.2500 mg | INTRAMUSCULAR | Status: DC | PRN

## 2022-10-17 MED ORDER — ONDANSETRON HCL 4 MG/2ML IJ SOLN
INTRAMUSCULAR | Status: AC
  Filled 2022-10-17: qty 2

## 2022-10-17 MED ORDER — KETOROLAC TROMETHAMINE 30 MG/ML IJ SOLN
INTRAMUSCULAR | Status: AC
  Filled 2022-10-17: qty 1

## 2022-10-17 MED ORDER — LIDOCAINE-EPINEPHRINE 0.5 %-1:200000 IJ SOLN
INTRAMUSCULAR | Status: DC | PRN
  Administered 2022-10-17: 10 mL

## 2022-10-17 MED ORDER — ORAL CARE MOUTH RINSE: 15.0000 mL | Freq: Once | OROMUCOSAL | Status: AC

## 2022-10-17 MED ORDER — BUPIVACAINE HCL (PF) 0.25 % IJ SOLN
INTRAMUSCULAR | Status: AC
  Filled 2022-10-17: qty 30

## 2022-10-17 MED ORDER — FENTANYL CITRATE PF 50 MCG/ML IJ SOSY
25.0000 ug | PREFILLED_SYRINGE | INTRAMUSCULAR | Status: DC | PRN
  Administered 2022-10-17 (×3): 50 ug via INTRAVENOUS
  Filled 2022-10-17 (×2): qty 1

## 2022-10-17 MED ORDER — ACETAMINOPHEN 325 MG PO TABS: 650.0000 mg | ORAL_TABLET | Freq: Four times a day (QID) | ORAL | Status: DC | PRN

## 2022-10-17 MED ORDER — LIDOCAINE HCL (CARDIAC) PF 50 MG/5ML IV SOSY
PREFILLED_SYRINGE | INTRAVENOUS | Status: DC | PRN
  Administered 2022-10-17: 100 mg via INTRAVENOUS

## 2022-10-17 MED ORDER — PROPOFOL 10 MG/ML IV BOLUS
INTRAVENOUS | Status: AC
  Filled 2022-10-17: qty 20

## 2022-10-17 MED ORDER — DOCUSATE SODIUM 100 MG PO CAPS: 100.0000 mg | ORAL_CAPSULE | Freq: Two times a day (BID) | ORAL | 0 refills | Status: DC

## 2022-10-17 MED ORDER — HYDROMORPHONE HCL 1 MG/ML IJ SOLN
INTRAMUSCULAR | Status: DC | PRN
  Administered 2022-10-17: .5 mg via INTRAVENOUS

## 2022-10-17 MED ORDER — KETOROLAC TROMETHAMINE 30 MG/ML IJ SOLN
INTRAMUSCULAR | Status: DC | PRN
  Administered 2022-10-17: 30 mg via INTRAVENOUS

## 2022-10-17 MED ORDER — SODIUM CHLORIDE 0.9 % IR SOLN
Status: DC | PRN
  Administered 2022-10-17: 1000 mL

## 2022-10-17 MED ORDER — LIDOCAINE HCL (PF) 2 % IJ SOLN
INTRAMUSCULAR | Status: AC
  Filled 2022-10-17: qty 5

## 2022-10-17 MED ORDER — ONDANSETRON HCL 4 MG/2ML IJ SOLN: 4.0000 mg | Freq: Once | INTRAMUSCULAR | Status: DC | PRN

## 2022-10-17 MED ORDER — LIDOCAINE-EPINEPHRINE 0.5 %-1:200000 IJ SOLN
INTRAMUSCULAR | Status: AC
  Filled 2022-10-17: qty 50

## 2022-10-17 MED ORDER — FENTANYL CITRATE (PF) 100 MCG/2ML IJ SOLN
INTRAMUSCULAR | Status: AC
  Filled 2022-10-17: qty 2

## 2022-10-17 MED ORDER — MIDAZOLAM HCL 2 MG/2ML IJ SOLN
INTRAMUSCULAR | Status: AC
  Filled 2022-10-17: qty 2

## 2022-10-17 MED ORDER — FENTANYL CITRATE PF 50 MCG/ML IJ SOSY
PREFILLED_SYRINGE | INTRAMUSCULAR | Status: AC
  Filled 2022-10-17: qty 1

## 2022-10-17 MED ORDER — CHLORHEXIDINE GLUCONATE 0.12 % MT SOLN
15.0000 mL | Freq: Once | OROMUCOSAL | Status: AC
  Administered 2022-10-17: 15 mL via OROMUCOSAL

## 2022-10-17 MED ORDER — MIDAZOLAM HCL 5 MG/5ML IJ SOLN
INTRAMUSCULAR | Status: DC | PRN
  Administered 2022-10-17: 2 mg via INTRAVENOUS

## 2022-10-17 MED ORDER — LACTATED RINGERS IV SOLN: INTRAVENOUS | Status: DC

## 2022-10-17 MED ORDER — SODIUM CHLORIDE (PF) 0.9 % IJ SOLN
INTRAMUSCULAR | Status: AC
  Filled 2022-10-17: qty 10

## 2022-10-17 MED ORDER — PROPOFOL 10 MG/ML IV BOLUS
INTRAVENOUS | Status: DC | PRN
  Administered 2022-10-17: 200 mg via INTRAVENOUS

## 2022-10-17 MED ORDER — FENTANYL CITRATE (PF) 100 MCG/2ML IJ SOLN
INTRAMUSCULAR | Status: DC | PRN
  Administered 2022-10-17 (×3): 50 ug via INTRAVENOUS

## 2022-10-17 MED ORDER — ROCURONIUM BROMIDE 10 MG/ML (PF) SYRINGE
PREFILLED_SYRINGE | INTRAVENOUS | Status: AC
  Filled 2022-10-17: qty 10

## 2022-10-17 MED ORDER — OXYCODONE HCL 5 MG PO TABS: 5.0000 mg | ORAL_TABLET | Freq: Four times a day (QID) | ORAL | 0 refills | Status: DC | PRN

## 2022-10-17 MED ORDER — BUPIVACAINE HCL (PF) 0.25 % IJ SOLN
INTRAMUSCULAR | Status: DC | PRN
  Administered 2022-10-17: 20 mL

## 2022-10-17 MED ORDER — SUGAMMADEX SODIUM 200 MG/2ML IV SOLN
INTRAVENOUS | Status: DC | PRN
  Administered 2022-10-17: 400 mg via INTRAVENOUS

## 2022-10-17 MED ORDER — ONDANSETRON HCL 4 MG PO TABS: 4.0000 mg | ORAL_TABLET | Freq: Three times a day (TID) | ORAL | 0 refills | Status: DC | PRN

## 2022-10-17 SURGICAL SUPPLY — 40 items
ADH SKN CLS APL DERMABOND .7 (GAUZE/BANDAGES/DRESSINGS) ×2
APL SWBSTK 6 STRL LF DISP (MISCELLANEOUS) ×2
APPLICATOR COTTON TIP 6 STRL (MISCELLANEOUS) IMPLANT
APPLICATOR COTTON TIP 6IN STRL (MISCELLANEOUS) ×2
BLADE SURG SZ11 CARB STEEL (BLADE) ×2 IMPLANT
CLOTH BEACON ORANGE TIMEOUT ST (SAFETY) ×2 IMPLANT
COVER LIGHT HANDLE STERIS (MISCELLANEOUS) ×4 IMPLANT
DERMABOND ADVANCED .7 DNX12 (GAUZE/BANDAGES/DRESSINGS) ×2 IMPLANT
DILATOR CANAL MILEX (MISCELLANEOUS) ×2 IMPLANT
DISSECTOR BLUNT TIP ENDO 5MM (MISCELLANEOUS) IMPLANT
DURAPREP 26ML APPLICATOR (WOUND CARE) ×2 IMPLANT
GAUZE 4X4 16PLY ~~LOC~~+RFID DBL (SPONGE) ×4 IMPLANT
GLOVE BIO SURGEON STRL SZ 6.5 (GLOVE) ×2 IMPLANT
GLOVE BIOGEL PI IND STRL 7.0 (GLOVE) ×6 IMPLANT
GOWN STRL REUS W/ TWL LRG LVL3 (GOWN DISPOSABLE) ×4 IMPLANT
GOWN STRL REUS W/TWL LRG LVL3 (GOWN DISPOSABLE) ×6 IMPLANT
IV NS IRRIG 3000ML ARTHROMATIC (IV SOLUTION) ×2 IMPLANT
KIT TURNOVER CYSTO (KITS) ×2 IMPLANT
NDL HYPO 25X1 1.5 SAFETY (NEEDLE) ×2 IMPLANT
NDL INSUFFLATION 14GA 120MM (NEEDLE) ×2 IMPLANT
NEEDLE HYPO 25X1 1.5 SAFETY (NEEDLE) ×2 IMPLANT
NEEDLE INSUFFLATION 14GA 120MM (NEEDLE) ×2 IMPLANT
NS IRRIG 1000ML POUR BTL (IV SOLUTION) ×2 IMPLANT
PACK PERI GYN (CUSTOM PROCEDURE TRAY) ×2 IMPLANT
PAD ARMBOARD 7.5X6 YLW CONV (MISCELLANEOUS) ×2 IMPLANT
PAD TELFA 3X4 1S STER (GAUZE/BANDAGES/DRESSINGS) ×2 IMPLANT
SET BASIN LINEN APH (SET/KITS/TRAYS/PACK) ×2 IMPLANT
SET GENESYS HTA PROCERVA (MISCELLANEOUS) ×2 IMPLANT
SET TUBE SMOKE EVAC HIGH FLOW (TUBING) ×2 IMPLANT
SHEARS HARMONIC ACE PLUS 36CM (ENDOMECHANICALS) IMPLANT
SLEEVE Z-THREAD 5X100MM (TROCAR) ×2 IMPLANT
SYR 10ML LL (SYRINGE) ×2 IMPLANT
SYR CONTROL 10ML LL (SYRINGE) ×2 IMPLANT
TRAY FOLEY W/BAG SLVR 16FR (SET/KITS/TRAYS/PACK) ×2
TRAY FOLEY W/BAG SLVR 16FR ST (SET/KITS/TRAYS/PACK) ×2 IMPLANT
TROCAR 5M 150ML BLDLS (TROCAR) IMPLANT
TROCAR KII 8X100ML NONTHREADED (TROCAR) ×2 IMPLANT
TROCAR XCEL NON-BLD 11X100MML (ENDOMECHANICALS) ×2 IMPLANT
TROCAR Z-THREAD FIOS 5X100MM (TROCAR) ×2 IMPLANT
WARMER LAPAROSCOPE (MISCELLANEOUS) ×2 IMPLANT

## 2022-10-17 NOTE — Discharge Instructions (Signed)
HOME INSTRUCTIONS  Please note any unusual or excessive bleeding, pain, swelling. Mild dizziness or drowsiness are normal for about 24 hours after surgery.   Shower when comfortable  Restrictions: No driving for 24 hours or while taking pain medications.  Activity:  No heavy lifting (> 10 lbs), nothing in vagina (no tampons, douching, or intercourse) x 4 weeks; no tub baths for 4 weeks Vaginal spotting is expected but if your bleeding is heavy, period like,  please call the office   Incision: the bandaids will fall off when they are ready to; you may clean your incision with mild soap and water but do not rub or scrub the incision site.  You may experience slight bloody drainage from your incision periodically.  This is normal.  If you experience a large amount of drainage or the incision opens, please call your physician who will likely direct you to the emergency department.  Diet:  You may return to your regular diet.  Do not eat large meals.  Eat small frequent meals throughout the day.  Continue to drink a good amount of water at least 6-8 glasses of water per day, hydration is very important for the healing process.  Pain Management: Take Ibuprofen every 6 hours with food as needed for pain and alternate this medication with tylenol (over the counter) every 6 hours.  For moderate to severe pain, a prescription of oxycodone has been sent in.  You can take this medication with tylenol when your pain is severe. -If the oxycodone is too strong, just take the tylenol.    Always take prescription pain medication with food.  Oxycodone may cause constipation, you may want to take a stool softener while taking this medication.  A prescription of colace has been sent in to take twice daily if needed while taking the oxycodone.  Be sure to drink plenty of fluids and increase your fiber to help with constipation.  Alcohol -- Avoid for 24 hours and while taking pain medications.  Nausea: Take sips of  ginger ale or soda.  I also sent in a prescription of zofran if needed.  Fever -- Call physician if temperature over 101 degrees  Follow up:  If you do not already have a follow up appointment scheduled, please call the office at [phone].  If you experience fever (a temperature greater than 100.4), pain unrelieved by pain medication, shortness of breath, swelling of a single leg, or any other symptoms which are concerning to you please the office immediately.

## 2022-10-17 NOTE — Op Note (Signed)
PREOPERATIVE DIAGNOSIS:  Desires sterilization, Abnormal/heavy menstrual bleeding POSTOPERATIVE DIAGNOSIS: same PROCEDURE PERFORMED: Laparoscopic right salpingectomy, hysteroscopy, D&C, Hydrothermal ablation SURGEON: Dr. Janyth Pupa ANESTHESIA: General endotracheal.  ESTIMATED BLOOD LOSS: 10cc.  URINE OUTPUT: 300cc of clear yellow urine at the end of the procedure.  IV FLUIDS: 1100 cc of crystalloid.  SPECIMEN(S): 1) endometrial curettings 2) right fallopian tube COMPLICATIONS: None.  CONDITION: Stable.   FINDINGS: 9cm anteverted uterus with proliferative endometrium.  Ostia visualized.  No ascites or peritoneal studding was appreciated.  Omental adhesions noted to the body of the uterus.  Uterus normal size and shape.  Normal left ovary, absent left fallopian tube.  Normal right tube and ovary.    PROCEDURE:   The patient was taken to the operating room where she underwent general anesthesia without difficulty. The patient was placed in a low lithotomy position using Allen stirrups. She was then prepped and draped in the normal sterile fashion. Sterile speculum was placed.  A single tooth tenaculum was placed on the anterior lip of the cervix. Cervical block was completed using 0.5% lidocaine with epinephrine.  The uterus was then sounded to 9. The endocervical canal was then serially dilated to 14French using Hank dilators to accommodate the hydrothermal ablation hysteroscopic apparatus.  The hysteroscope was removed and sharp curettage was performed. The tissue was sent to pathology.   The hydrothermal ablation hysteroscopic apparatus was then reinserted.  Cavity assessment was performed and passed with minimal leakage of fluid.  The hydrothermal ablation was then carried out as per protocol.   Complete ablation of the endometrium was observed and the hysteroscope was removed under direct visualization.  All instrument were then removed. Hemostasis was observed at the cervical site.  Spongestick was placed.  The patient was repositioned to the supine position.  Gown and gloves were changed.  Attention was then turned to the patients abdomen, 10cc of 0.25% Lidocaine was inserted.  A 5 mm infraumbilical skin incision was made with the scalpel. The veress needle was carefully introduced into the peritoneal cavity while tenting the abdominal wall.  Pt did not pass the saline drop test, direct port placement was attempted; however due to body habitus, entry int the abdominal cavity was insufficient.  Attention was turned to Palmer's point.  OG had previously been placed.  The Veress needle was carefully introduced into the peritoneal cavity while tenting the abdominal wall.The gas was connected and confirmed intrabdominal placement by a low initial pressure of 51mHg. The abdomen was then insuflated with CO2 gas. The trocar and sleeve were then advanced without difficulty into the abdomen under direct visualization. Intraabdominal placement was confirmed by the laparoscope and surveillance of the abdomen was performed. Grossly normal appearing abdomen as mentioned in findings above.  Once the abdomen was insufflated, under direct visualization the umbilical port was placed.  A third, 862mtrocar was placed in the left lower quadrant under direct visualization.   Findings were as noted above.  The right fallopian tube was placed on tension and using the Harmonic, the tube was excised and removed through the port under direct visualization.  Excellent hemostasis was noted.  The instruments were then removed from the patients abdomen with air allowed to fully escape. The incision sites were closed with dermabond.  The spongestick was removed.  The patient tolerated the procedure well with all sponge, lap, and needle counts correct. The patient was taken to recovery in stable condition. Foley was removed at the end of the procedure.  JeJanyth Pupa  DO Attending [place], [place] for [employer], [place]

## 2022-10-17 NOTE — Transfer of Care (Signed)
Immediate Anesthesia Transfer of Care Note  Patient: Eliahna Stigers  Procedure(s) Performed: DILATATION & CURETTAGE/HYSTEROSCOPY WITH HYDROTHERMAL ABLATION LAPAROSCOPIC UNILATERAL SALPINGECTOMY (Right)  Patient Location: PACU  Anesthesia Type:General  Level of Consciousness: awake  Airway & Oxygen Therapy: Patient Spontanous Breathing  Post-op Assessment: Report given to RN  Post vital signs: Reviewed and stable  Last Vitals:  Vitals Value Taken Time  BP 110/55 10/17/22 0949  Temp 36.7 C 10/17/22 0948  Pulse 78 10/17/22 0950  Resp 12 10/17/22 0950  SpO2 96 % 10/17/22 0950  Vitals shown include unvalidated device data.  Last Pain:  Vitals:   10/17/22 0705  TempSrc: Oral  PainSc: 0-No pain      Patients Stated Pain Goal: 5 ([id number])  Complications: No notable events documented.

## 2022-10-17 NOTE — Anesthesia Postprocedure Evaluation (Signed)
Anesthesia Post Note  Patient: Denise Buck  Procedure(s) Performed: DILATATION & CURETTAGE/HYSTEROSCOPY WITH HYDROTHERMAL ABLATION (Vagina ) LAPAROSCOPIC UNILATERAL SALPINGECTOMY (Right: Abdomen)  Patient location during evaluation: Phase II Anesthesia Type: General Level of consciousness: awake and alert and oriented Pain management: pain level controlled Vital Signs Assessment: post-procedure vital signs reviewed and stable Respiratory status: spontaneous breathing, nonlabored ventilation and respiratory function stable Cardiovascular status: blood pressure returned to baseline and stable Postop Assessment: no apparent nausea or vomiting Anesthetic complications: no  No notable events documented.   Last Vitals:  Vitals:   10/17/22 1045 10/17/22 1100  BP: (!) 131/90 108/66  Pulse: (!) 55 (!) 56  Resp: 14 14  Temp:  36.7 C  SpO2: 95% 100%    Last Pain:  Vitals:   10/17/22 1045  TempSrc:   PainSc: 5                  Grizel Vesely C Kylani Wires

## 2022-10-17 NOTE — Anesthesia Preprocedure Evaluation (Addendum)
Anesthesia Evaluation  Patient identified by MRN, date of birth, ID band Patient awake    Reviewed: Allergy & Precautions, H&P , NPO status , Patient's Chart, lab work & pertinent test results  Airway Mallampati: II  TM Distance: >3 FB Neck ROM: Full    Dental  (+) Teeth Intact, Dental Advisory Given   Pulmonary former smoker   Pulmonary exam normal breath sounds clear to auscultation       Cardiovascular negative cardio ROS Normal cardiovascular exam Rhythm:Regular Rate:Normal     Neuro/Psych negative neurological ROS  negative psych ROS   GI/Hepatic negative GI ROS,,,(+)     substance abuse  marijuana use  Endo/Other    Morbid obesity  Renal/GU negative Renal ROS  negative genitourinary   Musculoskeletal negative musculoskeletal ROS (+)    Abdominal   Peds negative pediatric ROS (+)  Hematology negative hematology ROS (+)   Anesthesia Other Findings   Reproductive/Obstetrics negative OB ROS                             Anesthesia Physical Anesthesia Plan  ASA: 3  Anesthesia Plan: General   Post-op Pain Management: Dilaudid IV   Induction: Intravenous  PONV Risk Score and Plan: 4 or greater and Ondansetron, Dexamethasone and Midazolam  Airway Management Planned: Oral ETT  Additional Equipment:   Intra-op Plan:   Post-operative Plan: Extubation in OR  Informed Consent: I have reviewed the patients History and Physical, chart, labs and discussed the procedure including the risks, benefits and alternatives for the proposed anesthesia with the patient or authorized representative who has indicated his/her understanding and acceptance.       Plan Discussed with: CRNA and Surgeon  Anesthesia Plan Comments:        Anesthesia Quick Evaluation

## 2022-10-17 NOTE — Anesthesia Procedure Notes (Signed)
Procedure Name: Intubation Date/Time: 10/17/2022 7:56 AM  Performed by: Ollen Bowl, CRNAPre-anesthesia Checklist: Patient identified, Patient being monitored, Timeout performed, Emergency Drugs available and Suction available Patient Re-evaluated:Patient Re-evaluated prior to induction Oxygen Delivery Method: Circle system utilized Preoxygenation: Pre-oxygenation with 100% oxygen Induction Type: IV induction Ventilation: Mask ventilation without difficulty Laryngoscope Size: Mac and 3 Grade View: Grade I Tube type: Oral Tube size: 7.0 mm Number of attempts: 1 Airway Equipment and Method: Stylet Placement Confirmation: ETT inserted through vocal cords under direct vision, positive ETCO2 and breath sounds checked- equal and bilateral Secured at: 21 cm Tube secured with: Tape Dental Injury: Teeth and Oropharynx as per pre-operative assessment

## 2022-10-17 NOTE — Progress Notes (Signed)
Took patient to restroom, patient voided, cleaned blood off patient inner thighs and placed mesh panties and pad on patient.  Patient passing moderate blood post d&c procedure.

## 2022-10-18 ENCOUNTER — Encounter: Payer: Self-pay | Admitting: Family

## 2022-10-19 ENCOUNTER — Encounter: Payer: Self-pay | Admitting: Obstetrics & Gynecology

## 2022-10-20 LAB — SURGICAL PATHOLOGY

## 2022-10-24 ENCOUNTER — Ambulatory Visit (INDEPENDENT_AMBULATORY_CARE_PROVIDER_SITE_OTHER): Payer: Medicaid Other | Admitting: Obstetrics and Gynecology

## 2022-10-24 ENCOUNTER — Encounter: Payer: Self-pay | Admitting: Obstetrics and Gynecology

## 2022-10-24 ENCOUNTER — Encounter: Payer: Medicaid Other | Admitting: Obstetrics and Gynecology

## 2022-10-24 VITALS — BP 145/98 | HR 84 | Ht 66.0 in | Wt 281.4 lb

## 2022-10-24 DIAGNOSIS — Z9889 Other specified postprocedural states: Secondary | ICD-10-CM

## 2022-10-24 DIAGNOSIS — Z9079 Acquired absence of other genital organ(s): Secondary | ICD-10-CM | POA: Insufficient documentation

## 2022-10-24 NOTE — Progress Notes (Signed)
Ms Teply presents for post op appt S/P right salpingectomy and endometrial ablation Pathology reviewed with pt  Denies any bowel or bladder dysfunction Some vaginal discharge, no odor or bleeding  PE AF VSS Lungs clear Heart RRR Abd soft + BS Small separation on one incision o/w incisions healing well   A/P Post op visit        Elevated BP  Wound care reviewed with pt . Return to ADL's as tolerates Advised to see PCP for elevated BP F/U PRN

## 2022-10-25 ENCOUNTER — Ambulatory Visit (INDEPENDENT_AMBULATORY_CARE_PROVIDER_SITE_OTHER): Payer: Medicaid Other

## 2022-10-25 ENCOUNTER — Encounter (HOSPITAL_COMMUNITY): Payer: Self-pay | Admitting: Obstetrics & Gynecology

## 2022-10-25 DIAGNOSIS — Z111 Encounter for screening for respiratory tuberculosis: Secondary | ICD-10-CM | POA: Diagnosis not present

## 2022-10-25 NOTE — Progress Notes (Addendum)
10/25/22 9:20 am Tuberculin skin test applied to left ventral forearm. Explained to patient to return within 48-72 hours for reading and paperwork. Patient verbalized understanding.   10/27/22 8:08 am-.PPD Reading Note PPD read and results entered in [place]. Result: 0 mm induration. Interpretation: Negative If test not read within 48-72 hours of initial placement, patient advised to repeat in other arm 1-3 weeks after this test. Allergic reaction: no

## 2022-10-27 ENCOUNTER — Ambulatory Visit: Payer: Medicaid Other

## 2022-10-27 LAB — TB SKIN TEST
Induration: 0 mm
TB Skin Test: NEGATIVE

## 2022-11-02 ENCOUNTER — Other Ambulatory Visit: Payer: Self-pay | Admitting: Family

## 2022-11-02 ENCOUNTER — Encounter: Payer: Self-pay | Admitting: Family

## 2022-11-02 ENCOUNTER — Encounter: Payer: Self-pay | Admitting: Obstetrics & Gynecology

## 2022-11-02 DIAGNOSIS — Z0184 Encounter for antibody response examination: Secondary | ICD-10-CM

## 2022-11-06 ENCOUNTER — Other Ambulatory Visit: Payer: Self-pay

## 2022-11-06 DIAGNOSIS — Z0184 Encounter for antibody response examination: Secondary | ICD-10-CM | POA: Diagnosis not present

## 2022-11-07 ENCOUNTER — Ambulatory Visit (INDEPENDENT_AMBULATORY_CARE_PROVIDER_SITE_OTHER): Payer: Medicaid Other

## 2022-11-07 DIAGNOSIS — Z111 Encounter for screening for respiratory tuberculosis: Secondary | ICD-10-CM

## 2022-11-07 LAB — VARICELLA ZOSTER ABS, IGG/IGM
Varicella IgM: <0.91 index (ref 0.00–0.90)
Varicella zoster IgG: 699 index (ref >165)

## 2022-11-07 NOTE — Progress Notes (Signed)
Patient came in for TB skin  test for school.  Left arm

## 2022-11-09 DIAGNOSIS — Z111 Encounter for screening for respiratory tuberculosis: Secondary | ICD-10-CM | POA: Diagnosis not present

## 2022-11-09 LAB — TB SKIN TEST
Induration: 1 mm
TB Skin Test: NEGATIVE

## 2023-02-04 ENCOUNTER — Encounter: Payer: Self-pay | Admitting: Family

## 2023-02-05 ENCOUNTER — Encounter: Payer: Self-pay | Admitting: Obstetrics & Gynecology

## 2023-02-06 ENCOUNTER — Telehealth: Payer: Self-pay | Admitting: Family

## 2023-02-06 NOTE — Telephone Encounter (Signed)
Called pt and left a vm to call office back to schedule appt per MyChart message.

## 2023-02-12 ENCOUNTER — Other Ambulatory Visit (HOSPITAL_COMMUNITY)
Admission: RE | Admit: 2023-02-12 | Discharge: 2023-02-12 | Disposition: A | Discharge status: Home / Self Care | Payer: Medicaid Other | Source: Ambulatory Visit | Attending: Obstetrics & Gynecology | Admitting: Obstetrics & Gynecology

## 2023-02-12 ENCOUNTER — Other Ambulatory Visit (INDEPENDENT_AMBULATORY_CARE_PROVIDER_SITE_OTHER): Payer: Medicaid Other

## 2023-02-12 DIAGNOSIS — Z113 Encounter for screening for infections with a predominantly sexual mode of transmission: Secondary | ICD-10-CM | POA: Insufficient documentation

## 2023-02-12 NOTE — Progress Notes (Signed)
   NURSE VISIT- STD  SUBJECTIVE:  Stacey Sago is a 32 y.o. [id number] GYN patientfemale here for a vaginal swab for STD screen.  She reports the following symptoms: none.  Had sex for the first time since her surgery and wants to just check. Denies abnormal vaginal bleeding, significant pelvic pain, fever, or UTI symptoms.  OBJECTIVE:  There were no vitals taken for this visit.  Appears well, in no apparent distress  ASSESSMENT: Vaginal swab for STD screen  PLAN: Self-collected vaginal probe for Gonorrhea, chlamydia, trichomonas sent to lab Treatment: to be determined once results are received Follow-up as needed if symptoms persist/worsen, or new symptoms develop  Jobe Marker  02/12/2023 4:28 PM

## 2023-02-14 ENCOUNTER — Other Ambulatory Visit (HOSPITAL_BASED_OUTPATIENT_CLINIC_OR_DEPARTMENT_OTHER): Payer: Self-pay | Admitting: Orthopaedic Surgery

## 2023-02-14 MED ORDER — MELOXICAM 15 MG PO TABS: 15.0000 mg | ORAL_TABLET | Freq: Every day | ORAL | 3 refills | Status: DC

## 2023-02-16 LAB — CERVICOVAGINAL ANCILLARY ONLY
Chlamydia: NEGATIVE
Comment: NEGATIVE
Comment: NEGATIVE
Comment: NORMAL
Neisseria Gonorrhea: NEGATIVE
Trichomonas: NEGATIVE

## 2023-03-18 ENCOUNTER — Encounter: Payer: Self-pay | Admitting: Obstetrics & Gynecology

## 2023-03-21 ENCOUNTER — Ambulatory Visit: Payer: Medicaid Other | Admitting: Advanced Practice Midwife

## 2023-03-21 ENCOUNTER — Other Ambulatory Visit: Payer: Self-pay

## 2023-03-21 DIAGNOSIS — G8929 Other chronic pain: Secondary | ICD-10-CM

## 2023-03-26 ENCOUNTER — Other Ambulatory Visit: Payer: Self-pay | Admitting: Surgical

## 2023-03-26 MED ORDER — IBUPROFEN 600 MG PO TABS: 600.0000 mg | ORAL_TABLET | Freq: Four times a day (QID) | ORAL | 1 refills | Status: DC | PRN

## 2023-04-30 ENCOUNTER — Encounter: Payer: Self-pay | Admitting: Surgical

## 2023-05-01 ENCOUNTER — Inpatient Hospital Stay: Admission: RE | Admit: 2023-05-01 | Payer: Medicaid Other | Source: Ambulatory Visit

## 2023-05-01 ENCOUNTER — Other Ambulatory Visit: Payer: Medicaid Other

## 2023-06-05 ENCOUNTER — Inpatient Hospital Stay
Admission: RE | Admit: 2023-06-05 | Discharge: 2023-06-05 | Disposition: A | Discharge status: Home / Self Care | Payer: Medicaid Other | Source: Ambulatory Visit | Attending: Surgical

## 2023-06-05 ENCOUNTER — Ambulatory Visit
Admission: RE | Admit: 2023-06-05 | Discharge: 2023-06-05 | Disposition: A | Discharge status: Home / Self Care | Payer: Medicaid Other | Source: Ambulatory Visit | Attending: Surgical

## 2023-06-05 DIAGNOSIS — G8929 Other chronic pain: Secondary | ICD-10-CM

## 2023-06-05 DIAGNOSIS — M25562 Pain in left knee: Secondary | ICD-10-CM | POA: Diagnosis not present

## 2023-06-05 MED ORDER — IOPAMIDOL (ISOVUE-M 200) INJECTION 41%
30.0000 mL | Freq: Once | INTRAMUSCULAR | Status: AC
  Administered 2023-06-05: 40 mL via INTRA_ARTICULAR

## 2023-06-05 MED ORDER — GADOPICLENOL 0.5 MMOL/ML IV SOLN: 8.5000 mL | Freq: Once | INTRAVENOUS | Status: DC | PRN

## 2023-06-15 DIAGNOSIS — K0253 Dental caries on pit and fissure surface penetrating into pulp: Secondary | ICD-10-CM | POA: Diagnosis not present

## 2023-06-15 DIAGNOSIS — R221 Localized swelling, mass and lump, neck: Secondary | ICD-10-CM | POA: Diagnosis not present

## 2023-07-09 DIAGNOSIS — J020 Streptococcal pharyngitis: Secondary | ICD-10-CM | POA: Diagnosis not present

## 2023-09-03 DIAGNOSIS — A64 Unspecified sexually transmitted disease: Secondary | ICD-10-CM | POA: Diagnosis not present

## 2023-09-03 DIAGNOSIS — N760 Acute vaginitis: Secondary | ICD-10-CM | POA: Diagnosis not present

## 2023-09-03 DIAGNOSIS — Z202 Contact with and (suspected) exposure to infections with a predominantly sexual mode of transmission: Secondary | ICD-10-CM | POA: Diagnosis not present

## 2023-10-03 DIAGNOSIS — N7689 Other specified inflammation of vagina and vulva: Secondary | ICD-10-CM | POA: Diagnosis not present

## 2023-10-09 DIAGNOSIS — J020 Streptococcal pharyngitis: Secondary | ICD-10-CM | POA: Diagnosis not present

## 2023-11-14 ENCOUNTER — Ambulatory Visit: Admitting: Women's Health

## 2023-11-14 ENCOUNTER — Other Ambulatory Visit (HOSPITAL_COMMUNITY)
Admission: RE | Admit: 2023-11-14 | Discharge: 2023-11-14 | Disposition: A | Discharge status: Home / Self Care | Source: Ambulatory Visit | Attending: Women's Health | Admitting: Women's Health

## 2023-11-14 ENCOUNTER — Encounter: Payer: Self-pay | Admitting: Women's Health

## 2023-11-14 VITALS — BP 115/75 | HR 85 | Ht 66.0 in | Wt 283.0 lb

## 2023-11-14 DIAGNOSIS — Z124 Encounter for screening for malignant neoplasm of cervix: Secondary | ICD-10-CM

## 2023-11-14 DIAGNOSIS — N898 Other specified noninflammatory disorders of vagina: Secondary | ICD-10-CM | POA: Diagnosis not present

## 2023-11-14 DIAGNOSIS — Z113 Encounter for screening for infections with a predominantly sexual mode of transmission: Secondary | ICD-10-CM | POA: Insufficient documentation

## 2023-11-14 LAB — CERVICOVAGINAL ANCILLARY ONLY
Bacterial Vaginitis (gardnerella): NEGATIVE
Candida Glabrata: NEGATIVE
Candida Vaginitis: NEGATIVE
Chlamydia: NEGATIVE
Comment: NEGATIVE
Comment: NEGATIVE
Comment: NEGATIVE
Comment: NEGATIVE
Comment: NEGATIVE
Comment: NORMAL
Neisseria Gonorrhea: NEGATIVE
Trichomonas: NEGATIVE

## 2023-11-14 NOTE — Addendum Note (Signed)
 Addended by: Moss Mc on: 11/14/2023 12:21 PM   Modules accepted: Orders

## 2023-11-14 NOTE — Progress Notes (Signed)
   GYN VISIT Patient name: Denise Buck MRN [id number]  Date of birth: 1991-06-17 Chief Complaint:   vaginal irration (Want std testing)  History of Present Illness:   Denise Buck is a 33 y.o. [phone] Caucasian female being seen today for vaginal irritation, no odor/abnormal d/c. Wants full STD screen.    No LMP recorded. Patient has had an ablation. The current method of family planning is  s/p ablation & BTL .  Last pap 04/05/21. Results were: NILM w/ HRHPV negative     01/10/2022    9:06 AM 10/04/2017    8:45 AM 11/20/2016    9:40 AM  Depression screen PHQ 2/9  Decreased Interest 3 0 0  Down, Depressed, Hopeless 3 0 0  PHQ - 2 Score 6 0 0  Altered sleeping 2  0  Tired, decreased energy 3  0  Change in appetite 3  3  Feeling bad or failure about yourself  2  0  Trouble concentrating 2  0  Moving slowly or fidgety/restless 0  0  Suicidal thoughts 0  0  PHQ-9 Score 18  3  Difficult doing work/chores Extremely dIfficult          01/10/2022    9:23 AM  GAD 7 : Generalized Anxiety Score  Nervous, Anxious, on Edge 3  Control/stop worrying 3  Worry too much - different things 3  Trouble relaxing 3  Restless 1  Easily annoyed or irritable 2  Afraid - awful might happen 0  Total GAD 7 Score 15  Anxiety Difficulty Extremely difficult     Review of Systems:   Pertinent items are noted in HPI Denies fever/chills, dizziness, headaches, visual disturbances, fatigue, shortness of breath, chest pain, abdominal pain, vomiting, abnormal vaginal discharge/itching/odor/irritation, problems with periods, bowel movements, urination, or intercourse unless otherwise stated above.  Pertinent History Reviewed:  Reviewed past medical,surgical, social, obstetrical and family history.  Reviewed problem list, medications and allergies. Physical Assessment:   Vitals:   11/14/23 1152 11/14/23 1201  BP: (!) 151/78 115/75  Pulse: 79 85  Weight: 283 lb (128.4 kg)   Height: 5\' 6"  (1.676  m)   Body mass index is 45.68 kg/m.       Physical Examination:   General appearance: alert, well appearing, and in no distress  Mental status: alert, oriented to person, place, and time  Skin: warm & dry   Cardiovascular: normal heart rate noted  Respiratory: normal respiratory effort, no distress  Abdomen: soft, non-tender   Pelvic: VULVA: normal appearing vulva with no masses, tenderness or lesions, VAGINA: normal appearing vagina with normal color and discharge, no lesions, CERVIX: normal appearing cervix without discharge or lesions  Extremities: no edema   Chaperone: Latisha Cresenzo  No results found for this or any previous visit (from the past 24 hours).  Assessment & Plan:  1) Vaginal irritation> CV swab  2) STD screen> CV swab and labs as below  3) Cervical cancer screen> pap today  Meds: No orders of the defined types were placed in this encounter.   Orders Placed This Encounter  Procedures   HIV Antibody (routine testing w rflx)   RPR   Hepatitis B surface antigen    Return in about 1 year (around 11/13/2024) for Physical.  Cheral Marker CNM, WHNP-BC 11/14/2023 12:09 PM

## 2023-11-15 ENCOUNTER — Encounter: Payer: Self-pay | Admitting: Women's Health

## 2023-11-15 LAB — CYTOLOGY - PAP
Adequacy: ABSENT
Comment: NEGATIVE
Diagnosis: NEGATIVE
High risk HPV: NEGATIVE

## 2023-11-16 LAB — HIV ANTIBODY (ROUTINE TESTING W REFLEX)

## 2023-11-16 LAB — HEPATITIS B SURFACE ANTIGEN: Hepatitis B Surface Ag: NEGATIVE

## 2023-11-16 LAB — RPR: RPR Ser Ql: NONREACTIVE

## 2023-11-20 ENCOUNTER — Encounter: Payer: Self-pay | Admitting: Women's Health

## 2023-11-29 ENCOUNTER — Other Ambulatory Visit: Payer: Self-pay | Admitting: Surgical

## 2023-11-29 MED ORDER — IBUPROFEN 600 MG PO TABS: 600.0000 mg | ORAL_TABLET | Freq: Four times a day (QID) | ORAL | 3 refills | Status: DC | PRN

## 2023-12-05 ENCOUNTER — Other Ambulatory Visit: Payer: Self-pay | Admitting: Surgical

## 2023-12-05 MED ORDER — IBUPROFEN 800 MG PO TABS: 800.0000 mg | ORAL_TABLET | Freq: Three times a day (TID) | ORAL | 2 refills | Status: AC | PRN

## 2023-12-18 ENCOUNTER — Encounter: Payer: Self-pay | Admitting: Women's Health

## 2024-01-06 DIAGNOSIS — N760 Acute vaginitis: Secondary | ICD-10-CM | POA: Diagnosis not present

## 2024-01-06 DIAGNOSIS — A64 Unspecified sexually transmitted disease: Secondary | ICD-10-CM | POA: Diagnosis not present

## 2024-01-08 ENCOUNTER — Encounter: Payer: Self-pay | Admitting: Family

## 2024-01-10 NOTE — Telephone Encounter (Signed)
 Scheduled pt for OV for weight loss medication

## 2024-01-21 ENCOUNTER — Ambulatory Visit (INDEPENDENT_AMBULATORY_CARE_PROVIDER_SITE_OTHER): Admitting: Family

## 2024-01-21 ENCOUNTER — Encounter: Payer: Self-pay | Admitting: Family

## 2024-01-21 VITALS — BP 125/79 | HR 68 | Temp 98.3°F | Resp 18 | Ht 63.5 in | Wt 298.8 lb

## 2024-01-21 DIAGNOSIS — Z7985 Long-term (current) use of injectable non-insulin antidiabetic drugs: Secondary | ICD-10-CM

## 2024-01-21 DIAGNOSIS — Z6841 Body Mass Index (BMI) 40.0 and over, adult: Secondary | ICD-10-CM

## 2024-01-21 DIAGNOSIS — Z7689 Persons encountering health services in other specified circumstances: Secondary | ICD-10-CM

## 2024-01-21 DIAGNOSIS — R41840 Attention and concentration deficit: Secondary | ICD-10-CM | POA: Diagnosis not present

## 2024-01-21 MED ORDER — TIRZEPATIDE-WEIGHT MANAGEMENT 2.5 MG/0.5ML ~~LOC~~ SOLN: 2.5000 mg | SUBCUTANEOUS | 0 refills | Status: AC

## 2024-01-21 NOTE — Progress Notes (Signed)
 Patient ID: Denise Buck, female    DOB: 1991/06/28  MRN: [id number]  CC: Weight Management  Subjective: Denise Buck is a 33 y.o. female who presents for weight management.  Her concerns today include:  - States she would like to lose 100 pounds. She is trying to watch what she eats. She exercises when able to do so. States in the past she was on Ozempic  and didn't help. States she would like to try Zepbound.  - States she has trouble focusing and considered if she has ADHD. Patient does not want to discuss anxiety depression. She denies thoughts of self-harm, suicidal ideations, homicidal ideations.    Patient Active Problem List   Diagnosis Date Noted   H/O bilateral salpingectomy 10/24/2022   History of endometrial ablation 10/24/2022   Abnormal uterine bleeding 09/04/2022   Menorrhagia with irregular cycle 12/08/2020   Family history of breast cancer 10/04/2017   Marijuana use 11/21/2016     Current Outpatient Medications on File Prior to Visit  Medication Sig Dispense Refill   ibuprofen  (ADVIL ) 800 MG tablet Take 1 tablet (800 mg total) by mouth every 8 (eight) hours as needed. 60 tablet 2   meloxicam  (MOBIC ) 15 MG tablet Take 1 tablet (15 mg total) by mouth daily. (Patient not taking: Reported on 11/14/2023) 30 tablet 3   No current facility-administered medications on file prior to visit.    Allergies  Allergen Reactions   Hydrocodone  Itching, Nausea And Vomiting and Rash    Social History   Socioeconomic History   Marital status: Single    Spouse name: Not on file   Number of children: 2   Years of education: Not on file   Highest education level: Not on file  Occupational History   Not on file  Tobacco Use   Smoking status: Former    Types: Cigarettes    Passive exposure: Past   Smokeless tobacco: Never  Vaping Use   Vaping status: Every Day  Substance and Sexual Activity   Alcohol use: No    Comment: occ; not now   Drug use: No   Sexual  activity: Yes    Birth control/protection: Condom  Other Topics Concern   Not on file  Social History Narrative   Not on file   Social Drivers of Health   Financial Resource Strain: Low Risk  (01/21/2024)   Overall Financial Resource Strain (CARDIA)    Difficulty of Paying Living Expenses: Not hard at all  Food Insecurity: No Food Insecurity (01/21/2024)   Hunger Vital Sign    Worried About Running Out of Food in the Last Year: Never true    Ran Out of Food in the Last Year: Never true  Transportation Needs: No Transportation Needs (01/21/2024)   PRAPARE - Administrator, Civil Service (Medical): No    Lack of Transportation (Non-Medical): No  Physical Activity: Insufficiently Active (01/21/2024)   Exercise Vital Sign    Days of Exercise per Week: 3 days    Minutes of Exercise per Session: 40 min  Stress: Stress Concern Present (01/21/2024)   [employer] of Occupational Health - Occupational Stress Questionnaire    Feeling of Stress : Very much  Social Connections: Socially Isolated (01/21/2024)   Social Connection and Isolation Panel [NHANES]    Frequency of Communication with Friends and Family: More than three times a week    Frequency of Social Gatherings with Friends and Family: Once a week    Attends Religious  Services: Never    Active Member of Clubs or Organizations: No    Attends Banker Meetings: Never    Marital Status: Never married  Intimate Partner Violence: Not At Risk (01/21/2024)   Humiliation, Afraid, Rape, and Kick questionnaire    Fear of Current or Ex-Partner: No    Emotionally Abused: No    Physically Abused: No    Sexually Abused: No    Family History  Problem Relation Age of Onset   Autism Maternal Uncle    Cancer Paternal Aunt        breast cancer   Cancer Paternal Grandmother        breast cancer    Past Surgical History:  Procedure Laterality Date   CESAREAN SECTION     CESAREAN SECTION N/A 06/11/2017   Procedure:  REPEAT CESAREAN SECTION;  Surgeon: Albino Hum, MD;  Location: [facility];  Service: Obstetrics;  Laterality: N/A;   CHOLECYSTECTOMY     DIAGNOSTIC LAPAROSCOPY WITH REMOVAL OF ECTOPIC PREGNANCY N/A 12/04/2019   Procedure: DIAGNOSTIC LAPAROSCOPY WITH REMOVAL OF ECTOPIC PREGNANCY;  Surgeon: Worley Headings, MD;  Location: [facility] OR;  Service: Gynecology;  Laterality: N/A;   DILATION AND CURETTAGE OF UTERUS N/A 11/16/2018   Procedure: SUCTION DILATATION AND CURETTAGE;  Surgeon: Wendelyn Halter, MD;  Location: [facility];  Service: Gynecology;  Laterality: N/A;   DILITATION & CURRETTAGE/HYSTROSCOPY WITH HYDROTHERMAL ABLATION N/A 10/17/2022   Procedure: DILATATION & CURETTAGE/HYSTEROSCOPY WITH HYDROTHERMAL ABLATION;  Surgeon: Ozan, Jennifer, DO;  Location: [facility];  Service: Gynecology;  Laterality: N/A;   INDUCED ABORTION  03/10/2022   LAPAROSCOPIC UNILATERAL SALPINGECTOMY Left 12/04/2019   Procedure: LAPAROSCOPIC LEFT SALPINGECTOMY;  Surgeon: Worley Headings, MD;  Location: [facility] OR;  Service: Gynecology;  Laterality: Left;   LAPAROSCOPIC UNILATERAL SALPINGECTOMY Right 10/17/2022   Procedure: LAPAROSCOPIC UNILATERAL SALPINGECTOMY;  Surgeon: Ozan, Jennifer, DO;  Location: [facility];  Service: Gynecology;  Laterality: Right;   WISDOM TOOTH EXTRACTION      ROS: Review of Systems Negative except as stated above  PHYSICAL EXAM: BP 125/79   Pulse 68   Temp 98.3 F (36.8 C) (Oral)   Resp 18   Ht 5' 3.5" (1.613 m)   Wt 298 lb 12.8 oz (135.5 kg)   LMP 01/08/2024 (Approximate)   SpO2 96%   BMI 52.10 kg/m   Physical Exam HENT:     Head: Normocephalic and atraumatic.     Nose: Nose normal.     Mouth/Throat:     Mouth: Mucous membranes are moist.     Pharynx: Oropharynx is clear.  Eyes:     Extraocular Movements: Extraocular movements intact.     Conjunctiva/sclera: Conjunctivae normal.     Pupils: Pupils are equal, round, and reactive to light.  Cardiovascular:     Rate and Rhythm:  Normal rate and regular rhythm.     Pulses: Normal pulses.     Heart sounds: Normal heart sounds.  Pulmonary:     Effort: Pulmonary effort is normal.     Breath sounds: Normal breath sounds.  Musculoskeletal:        General: Normal range of motion.     Cervical back: Normal range of motion and neck supple.  Neurological:     General: No focal deficit present.     Mental Status: She is alert and oriented to person, place, and time.  Psychiatric:        Mood and Affect: Mood normal.  Behavior: Behavior normal.     ASSESSMENT AND PLAN: 1. Encounter for weight management (Primary) 2. BMI 50.0-59.9, adult (HCC) - Tirzepatide as prescribed. Counseled on medication adherence/adverse effects.  - Routine screening.  - Patient declined referral to Medical Weight Management. - Follow-up with primary provider in 4 weeks or sooner if needed. - TSH - tirzepatide (ZEPBOUND) 2.5 MG/0.5ML injection vial; Inject 2.5 mg into the skin once a week.  Dispense: 2 mL; Refill: 0 - Hemoglobin A1c  3. Inattention - Referral to Psychiatry for evaluation/management. - Ambulatory referral to Psychiatry   Patient was given the opportunity to ask questions.  Patient verbalized understanding of the plan and was able to repeat key elements of the plan. Patient was given clear instructions to go to Emergency Department or return to medical center if symptoms don't improve, worsen, or new problems develop.The patient verbalized understanding.   Orders Placed This Encounter  Procedures   TSH   Hemoglobin A1c   Ambulatory referral to Psychiatry     Requested Prescriptions   Signed Prescriptions Disp Refills   tirzepatide (ZEPBOUND) 2.5 MG/0.5ML injection vial 2 mL 0    Sig: Inject 2.5 mg into the skin once a week.    Return in about 4 weeks (around 02/18/2024) for Follow-Up or next available weight check.  Senaida Dama, NP

## 2024-01-21 NOTE — Progress Notes (Signed)
 Wants something for ADHD, weight loss wants zepbound PHQ-9 score was 19, patient score a 19 on her GAD7

## 2024-01-22 ENCOUNTER — Ambulatory Visit: Payer: Self-pay | Admitting: Family

## 2024-01-22 LAB — TSH: TSH: 1.94 u[IU]/mL (ref 0.450–4.500)

## 2024-01-22 LAB — HEMOGLOBIN A1C
Est. average glucose Bld gHb Est-mCnc: 97 mg/dL
Hgb A1c MFr Bld: 5 % (ref 4.8–5.6)

## 2024-01-23 ENCOUNTER — Other Ambulatory Visit: Payer: Self-pay | Admitting: Family

## 2024-01-23 ENCOUNTER — Telehealth: Payer: Self-pay

## 2024-01-23 DIAGNOSIS — Z7689 Persons encountering health services in other specified circumstances: Secondary | ICD-10-CM

## 2024-01-23 DIAGNOSIS — Z6841 Body Mass Index (BMI) 40.0 and over, adult: Secondary | ICD-10-CM

## 2024-01-23 MED ORDER — SEMAGLUTIDE-WEIGHT MANAGEMENT 0.25 MG/0.5ML ~~LOC~~ SOAJ: 0.2500 mg | SUBCUTANEOUS | 0 refills | Status: DC

## 2024-01-23 NOTE — Telephone Encounter (Signed)
 Pharmacy Patient Advocate Encounter  Received notification from [facility] that Prior Authorization for ZEPBOUND has been DENIED.  Full denial letter will be uploaded to the media tab. See denial reason below.   PA #/Case ID/Reference #: [id number]  MUST HAVE 3 MONTH TRIAL OF WEGOVY 

## 2024-01-23 NOTE — Telephone Encounter (Signed)
PA has been sent to pharmacy.

## 2024-01-23 NOTE — Telephone Encounter (Signed)
 Complete

## 2024-01-24 NOTE — Telephone Encounter (Signed)
 Loetta Ringer are we waiting for a PA?

## 2024-01-24 NOTE — Telephone Encounter (Signed)
 I called patient and made her aware of the changes and that the wegovy  was sent to the pharmacy

## 2024-01-25 ENCOUNTER — Telehealth: Payer: Self-pay

## 2024-01-25 ENCOUNTER — Other Ambulatory Visit (HOSPITAL_COMMUNITY): Payer: Self-pay

## 2024-01-25 NOTE — Telephone Encounter (Signed)
 Pharmacy Patient Advocate Encounter   Received notification from Physician's Office that prior authorization for WEGOVY  0.25MG  is required/requested.   Insurance verification completed.   The patient is insured through [facility] .   Per test claim: PA required; PA submitted to above mentioned insurance via CoverMyMeds Key/confirmation #/EOC [id number]. Status is pending

## 2024-01-28 NOTE — Telephone Encounter (Signed)
 Pharmacy Patient Advocate Encounter  Received notification from [facility] that Prior Authorization for WEGOVY  0.25MG  has been APPROVED from 01/25/24 to 07/23/24   PA #/Case ID/Reference #: [id number]

## 2024-01-28 NOTE — Telephone Encounter (Signed)
 Noted

## 2024-02-18 ENCOUNTER — Other Ambulatory Visit: Payer: Self-pay | Admitting: Family

## 2024-02-18 ENCOUNTER — Ambulatory Visit (INDEPENDENT_AMBULATORY_CARE_PROVIDER_SITE_OTHER): Admitting: Family

## 2024-02-18 VITALS — BP 122/80 | HR 67 | Temp 97.9°F | Resp 18 | Ht 66.0 in | Wt 284.8 lb

## 2024-02-18 DIAGNOSIS — E6609 Other obesity due to excess calories: Secondary | ICD-10-CM | POA: Diagnosis not present

## 2024-02-18 DIAGNOSIS — Z7689 Persons encountering health services in other specified circumstances: Secondary | ICD-10-CM

## 2024-02-18 DIAGNOSIS — K219 Gastro-esophageal reflux disease without esophagitis: Secondary | ICD-10-CM | POA: Diagnosis not present

## 2024-02-18 DIAGNOSIS — Z6841 Body Mass Index (BMI) 40.0 and over, adult: Secondary | ICD-10-CM | POA: Diagnosis not present

## 2024-02-18 MED ORDER — SEMAGLUTIDE-WEIGHT MANAGEMENT 0.5 MG/0.5ML ~~LOC~~ SOAJ
0.5000 mg | SUBCUTANEOUS | 0 refills | Status: AC
Start: 2024-02-18 — End: ?

## 2024-02-18 MED ORDER — SEMAGLUTIDE-WEIGHT MANAGEMENT 0.25 MG/0.5ML ~~LOC~~ SOAJ: 0.5000 mg | SUBCUTANEOUS | 0 refills | Status: AC

## 2024-02-18 MED ORDER — OMEPRAZOLE 20 MG PO CPDR: 20.0000 mg | DELAYED_RELEASE_CAPSULE | Freq: Every day | ORAL | 0 refills | Status: AC

## 2024-02-18 NOTE — Telephone Encounter (Signed)
 Complete

## 2024-02-18 NOTE — Progress Notes (Signed)
 4 week follow up. A lot of nausea and heartburn that tums is not helping

## 2024-02-18 NOTE — Progress Notes (Signed)
 Patient ID: Denise Buck, female    DOB: [birth year]  MRN: [id number]  CC: Weight Check  Subjective: Denise Buck is a 33 y.o. female who presents for weight check.   Her concerns today include:  Reports her health insurance wants her to try Wegovy  for 3 months before trying Zepbound . States she has noticed heartburn and nausea since beginning Wegovy  but tolerable for now. She is taking over-the-counter medication to help with acid reflux with minimal relief. States she is ready to increase Wegovy  dose.   Patient Active Problem List   Diagnosis Date Noted   H/O bilateral salpingectomy 10/24/2022   History of endometrial ablation 10/24/2022   Abnormal uterine bleeding 09/04/2022   Menorrhagia with irregular cycle 12/08/2020   Family history of breast cancer 10/04/2017   Marijuana use 11/21/2016     Current Outpatient Medications on File Prior to Visit  Medication Sig Dispense Refill   ibuprofen  (ADVIL ) 800 MG tablet Take 1 tablet (800 mg total) by mouth every 8 (eight) hours as needed. 60 tablet 2   meloxicam  (MOBIC ) 15 MG tablet Take 1 tablet (15 mg total) by mouth daily. (Patient not taking: Reported on 11/14/2023) 30 tablet 3   tirzepatide  (ZEPBOUND ) 2.5 MG/0.5ML injection vial Inject 2.5 mg into the skin once a week. (Patient not taking: Reported on 02/18/2024) 2 mL 0   No current facility-administered medications on file prior to visit.    Allergies  Allergen Reactions   Hydrocodone  Itching, Nausea And Vomiting and Rash    Social History   Socioeconomic History   Marital status: Single    Spouse name: Not on file   Number of children: 2   Years of education: Not on file   Highest education level: Not on file  Occupational History   Not on file  Tobacco Use   Smoking status: Former    Types: Cigarettes    Passive exposure: Past   Smokeless tobacco: Never  Vaping Use   Vaping status: Every Day  Substance and Sexual Activity   Alcohol use: No     Comment: occ; not now   Drug use: No   Sexual activity: Yes    Birth control/protection: Condom  Other Topics Concern   Not on file  Social History Narrative   Not on file   Social Drivers of Health   Financial Resource Strain: Low Risk  (01/21/2024)   Overall Financial Resource Strain (CARDIA)    Difficulty of Paying Living Expenses: Not hard at all  Food Insecurity: No Food Insecurity (01/21/2024)   Hunger Vital Sign    Worried About Running Out of Food in the Last Year: Never true    Ran Out of Food in the Last Year: Never true  Transportation Needs: No Transportation Needs (01/21/2024)   PRAPARE - Administrator, Civil Service (Medical): No    Lack of Transportation (Non-Medical): No  Physical Activity: Insufficiently Active (01/21/2024)   Exercise Vital Sign    Days of Exercise per Week: 3 days    Minutes of Exercise per Session: 40 min  Stress: Stress Concern Present (01/21/2024)   [employer] of Occupational Health - Occupational Stress Questionnaire    Feeling of Stress : Very much  Social Connections: Socially Isolated (01/21/2024)   Social Connection and Isolation Panel    Frequency of Communication with Friends and Family: More than three times a week    Frequency of Social Gatherings with Friends and Family: Once a week  Attends Religious Services: Never    Active Member of Clubs or Organizations: No    Attends Banker Meetings: Never    Marital Status: Never married  Intimate Partner Violence: Not At Risk (01/21/2024)   Humiliation, Afraid, Rape, and Kick questionnaire    Fear of Current or Ex-Partner: No    Emotionally Abused: No    Physically Abused: No    Sexually Abused: No    Family History  Problem Relation Age of Onset   Autism Maternal Uncle    Cancer Paternal Aunt        breast cancer   Cancer Paternal Grandmother        breast cancer    Past Surgical History:  Procedure Laterality Date   CESAREAN SECTION     CESAREAN  SECTION N/A 06/11/2017   Procedure: REPEAT CESAREAN SECTION;  Surgeon: Edsel Norleen GAILS, MD;  Location: [facility];  Service: Obstetrics;  Laterality: N/A;   CHOLECYSTECTOMY     DIAGNOSTIC LAPAROSCOPY WITH REMOVAL OF ECTOPIC PREGNANCY N/A 12/04/2019   Procedure: DIAGNOSTIC LAPAROSCOPY WITH REMOVAL OF ECTOPIC PREGNANCY;  Surgeon: Trudy Rozelle DASEN, MD;  Location: [facility] OR;  Service: Gynecology;  Laterality: N/A;   DILATION AND CURETTAGE OF UTERUS N/A 11/16/2018   Procedure: SUCTION DILATATION AND CURETTAGE;  Surgeon: Jayne Vonn DEL, MD;  Location: [facility];  Service: Gynecology;  Laterality: N/A;   DILITATION & CURRETTAGE/HYSTROSCOPY WITH HYDROTHERMAL ABLATION N/A 10/17/2022   Procedure: DILATATION & CURETTAGE/HYSTEROSCOPY WITH HYDROTHERMAL ABLATION;  Surgeon: Ozan, Jennifer, DO;  Location: [facility];  Service: Gynecology;  Laterality: N/A;   INDUCED ABORTION  03/10/2022   LAPAROSCOPIC UNILATERAL SALPINGECTOMY Left 12/04/2019   Procedure: LAPAROSCOPIC LEFT SALPINGECTOMY;  Surgeon: Trudy Rozelle DASEN, MD;  Location: [facility] OR;  Service: Gynecology;  Laterality: Left;   LAPAROSCOPIC UNILATERAL SALPINGECTOMY Right 10/17/2022   Procedure: LAPAROSCOPIC UNILATERAL SALPINGECTOMY;  Surgeon: Ozan, Jennifer, DO;  Location: [facility];  Service: Gynecology;  Laterality: Right;   WISDOM TOOTH EXTRACTION      ROS: Review of Systems Negative except as stated above  PHYSICAL EXAM: BP 122/80   Pulse 67   Temp 97.9 F (36.6 C) (Oral)   Resp 18   Ht 5' 6 (1.676 m)   Wt 284 lb 12.8 oz (129.2 kg)   LMP 02/04/2024 (Exact Date)   SpO2 98%   BMI 45.97 kg/m   Wt Readings from Last 3 Encounters:  02/18/24 284 lb 12.8 oz (129.2 kg)  01/21/24 298 lb 12.8 oz (135.5 kg)  11/14/23 283 lb (128.4 kg)   Physical Exam HENT:     Head: Normocephalic and atraumatic.     Nose: Nose normal.     Mouth/Throat:     Mouth: Mucous membranes are moist.     Pharynx: Oropharynx is clear.   Eyes:     Extraocular Movements:  Extraocular movements intact.     Conjunctiva/sclera: Conjunctivae normal.     Pupils: Pupils are equal, round, and reactive to light.    Cardiovascular:     Rate and Rhythm: Normal rate and regular rhythm.     Pulses: Normal pulses.     Heart sounds: Normal heart sounds.  Pulmonary:     Effort: Pulmonary effort is normal.     Breath sounds: Normal breath sounds.   Musculoskeletal:        General: Normal range of motion.     Cervical back: Normal range of motion and neck supple.   Neurological:     General: No  focal deficit present.     Mental Status: She is alert and oriented to person, place, and time.   Psychiatric:        Mood and Affect: Mood normal.        Behavior: Behavior normal.     ASSESSMENT AND PLAN: 1. Encounter for weight management (Primary) 2. BMI 45.0-49.9, adult [facility]) - Patient lost 14 pounds since previous office visit.  - Increase Semaglutide -Weight Management from 0.25 mg to 0.5 mg as prescribed. Counseled on medication adherence/adverse effects.  - Follow-up with primary provider in 4 weeks or sooner if needed.  - Semaglutide -Weight Management 0.25 MG/0.5ML SOAJ; Inject 0.5 mg into the skin once a week.  Dispense: 6 mL; Refill: 0  3. Gastroesophageal reflux disease, unspecified whether esophagitis present - Omeprazole as prescribed. Counseled on medication adherence/adverse effects.  - Follow-up with primary provider as scheduled. - omeprazole (PRILOSEC) 20 MG capsule; Take 1 capsule (20 mg total) by mouth daily.  Dispense: 90 capsule; Refill: 0  Patient was given the opportunity to ask questions.  Patient verbalized understanding of the plan and was able to repeat key elements of the plan. Patient was given clear instructions to go to Emergency Department or return to medical center if symptoms don't improve, worsen, or new problems develop.The patient verbalized understanding.    Requested Prescriptions   Signed Prescriptions Disp Refills    Semaglutide -Weight Management 0.25 MG/0.5ML SOAJ 6 mL 0    Sig: Inject 0.5 mg into the skin once a week.   omeprazole (PRILOSEC) 20 MG capsule 90 capsule 0    Sig: Take 1 capsule (20 mg total) by mouth daily.    Return in about 4 weeks (around 03/17/2024) for Follow-Up or next available weight check.  Greig JINNY Drones, NP

## 2024-02-19 NOTE — Telephone Encounter (Signed)
 Semaglutide -Weight Management 0.5 mg prescribed on 02/18/2024  4:41 pm.

## 2024-03-19 ENCOUNTER — Ambulatory Visit: Admitting: Family

## 2024-03-19 ENCOUNTER — Encounter: Payer: Self-pay | Admitting: Family

## 2024-03-19 VITALS — BP 114/74 | HR 79 | Temp 98.9°F | Resp 18 | Ht 66.0 in | Wt 279.4 lb

## 2024-03-19 DIAGNOSIS — E669 Obesity, unspecified: Secondary | ICD-10-CM

## 2024-03-19 DIAGNOSIS — Z7985 Long-term (current) use of injectable non-insulin antidiabetic drugs: Secondary | ICD-10-CM | POA: Diagnosis not present

## 2024-03-19 DIAGNOSIS — Z6841 Body Mass Index (BMI) 40.0 and over, adult: Secondary | ICD-10-CM | POA: Diagnosis not present

## 2024-03-19 DIAGNOSIS — Z7689 Persons encountering health services in other specified circumstances: Secondary | ICD-10-CM

## 2024-03-19 MED ORDER — SEMAGLUTIDE-WEIGHT MANAGEMENT 1 MG/0.5ML ~~LOC~~ SOAJ
1.0000 mg | SUBCUTANEOUS | 0 refills | Status: DC
Start: 2024-03-19 — End: 2024-04-28

## 2024-03-19 NOTE — Progress Notes (Signed)
 Patient ID: Denise Buck, female    DOB: 1991/08/21  MRN: [id number]  CC: Weight Check  Subjective: Denise Buck is a 33 y.o. female who presents for weight check.   Her concerns today include:  Doing well on Semaglutide -Weight Management. States she feels her appetite is recently increasing. Otherwise doing well on regimen with no issues/concerns.  Patient Active Problem List   Diagnosis Date Noted   H/O bilateral salpingectomy 10/24/2022   History of endometrial ablation 10/24/2022   Abnormal uterine bleeding 09/04/2022   Menorrhagia with irregular cycle 12/08/2020   Family history of breast cancer 10/04/2017   Marijuana use 11/21/2016     Current Outpatient Medications on File Prior to Visit  Medication Sig Dispense Refill   ibuprofen  (ADVIL ) 800 MG tablet Take 1 tablet (800 mg total) by mouth every 8 (eight) hours as needed. 60 tablet 2   omeprazole  (PRILOSEC) 20 MG capsule Take 1 capsule (20 mg total) by mouth daily. 90 capsule 0   Semaglutide -Weight Management 0.25 MG/0.5ML SOAJ Inject 0.5 mg into the skin once a week. 6 mL 0   Semaglutide -Weight Management 0.5 MG/0.5ML SOAJ Inject 0.5 mg into the skin once a week. 6 mL 0   meloxicam  (MOBIC ) 15 MG tablet Take 1 tablet (15 mg total) by mouth daily. (Patient not taking: Reported on 11/14/2023) 30 tablet 3   tirzepatide  (ZEPBOUND ) 2.5 MG/0.5ML injection vial Inject 2.5 mg into the skin once a week. (Patient not taking: Reported on 02/18/2024) 2 mL 0   No current facility-administered medications on file prior to visit.    Allergies  Allergen Reactions   Hydrocodone  Itching, Nausea And Vomiting and Rash    Social History   Socioeconomic History   Marital status: Single    Spouse name: Not on file   Number of children: 2   Years of education: Not on file   Highest education level: Associate degree: occupational, Scientist, product/process development, or vocational program  Occupational History   Not on file  Tobacco Use   Smoking  status: Former    Types: Cigarettes    Passive exposure: Past   Smokeless tobacco: Never  Vaping Use   Vaping status: Every Day  Substance and Sexual Activity   Alcohol use: No    Comment: occ; not now   Drug use: No   Sexual activity: Yes    Birth control/protection: Condom  Other Topics Concern   Not on file  Social History Narrative   Not on file   Social Drivers of Health   Financial Resource Strain: Medium Risk (03/15/2024)   Overall Financial Resource Strain (CARDIA)    Difficulty of Paying Living Expenses: Somewhat hard  Food Insecurity: No Food Insecurity (03/15/2024)   Hunger Vital Sign    Worried About Running Out of Food in the Last Year: Never true    Ran Out of Food in the Last Year: Never true  Transportation Needs: No Transportation Needs (03/15/2024)   PRAPARE - Administrator, Civil Service (Medical): No    Lack of Transportation (Non-Medical): No  Physical Activity: Sufficiently Active (03/15/2024)   Exercise Vital Sign    Days of Exercise per Week: 5 days    Minutes of Exercise per Session: 60 min  Recent Concern: Physical Activity - Insufficiently Active (01/21/2024)   Exercise Vital Sign    Days of Exercise per Week: 3 days    Minutes of Exercise per Session: 40 min  Stress: Stress Concern Present (03/15/2024)   [place]  [employer] - Occupational Stress Questionnaire    Feeling of Stress: Rather much  Social Connections: Socially Isolated (03/15/2024)   Social Connection and Isolation Panel    Frequency of Communication with Friends and Family: More than three times a week    Frequency of Social Gatherings with Friends and Family: Once a week    Attends Religious Services: Never    Database administrator or Organizations: No    Attends Engineer, structural: Not on file    Marital Status: Never married  Intimate Partner Violence: Not At Risk (01/21/2024)   Humiliation, Afraid, Rape, and Kick questionnaire    Fear of  Current or Ex-Partner: No    Emotionally Abused: No    Physically Abused: No    Sexually Abused: No    Family History  Problem Relation Age of Onset   Autism Maternal Uncle    Cancer Paternal Aunt        breast cancer   Cancer Paternal Grandmother        breast cancer    Past Surgical History:  Procedure Laterality Date   CESAREAN SECTION     CESAREAN SECTION N/A 06/11/2017   Procedure: REPEAT CESAREAN SECTION;  Surgeon: Edsel Norleen GAILS, MD;  [facility];  Service: Obstetrics;  Laterality: N/A;   CHOLECYSTECTOMY     DIAGNOSTIC LAPAROSCOPY WITH REMOVAL OF ECTOPIC PREGNANCY N/A 12/04/2019   Procedure: DIAGNOSTIC LAPAROSCOPY WITH REMOVAL OF ECTOPIC PREGNANCY;  Surgeon: Trudy Rozelle DASEN, MD;  Location: [facility] OR;  Service: Gynecology;  Laterality: N/A;   DILATION AND CURETTAGE OF UTERUS N/A 11/16/2018   Procedure: SUCTION DILATATION AND CURETTAGE;  Surgeon: Jayne Vonn DEL, MD;  Location: [facility];  Service: Gynecology;  Laterality: N/A;   DILITATION & CURRETTAGE/HYSTROSCOPY WITH HYDROTHERMAL ABLATION N/A 10/17/2022   Procedure: DILATATION & CURETTAGE/HYSTEROSCOPY WITH HYDROTHERMAL ABLATION;  Surgeon: Ozan, Jennifer, DO;  Location: [facility];  Service: Gynecology;  Laterality: N/A;   INDUCED ABORTION  03/10/2022   LAPAROSCOPIC UNILATERAL SALPINGECTOMY Left 12/04/2019   Procedure: LAPAROSCOPIC LEFT SALPINGECTOMY;  Surgeon: Trudy Rozelle DASEN, MD;  Location: [facility] OR;  Service: Gynecology;  Laterality: Left;   LAPAROSCOPIC UNILATERAL SALPINGECTOMY Right 10/17/2022   Procedure: LAPAROSCOPIC UNILATERAL SALPINGECTOMY;  Surgeon: Ozan, Jennifer, DO;  Location: [facility];  Service: Gynecology;  Laterality: Right;   WISDOM TOOTH EXTRACTION      ROS: Review of Systems Negative except as stated above  PHYSICAL EXAM: BP 114/74   Pulse 79   Temp 98.9 F (37.2 C) (Oral)   Resp 18   Ht 5' 6 (1.676 m)   Wt 279 lb 6.4 oz (126.7 kg)   LMP 02/25/2024 (Exact Date)   SpO2 97%   BMI 45.10  kg/m   Wt Readings from Last 3 Encounters:  03/19/24 279 lb 6.4 oz (126.7 kg)  02/18/24 284 lb 12.8 oz (129.2 kg)  01/21/24 298 lb 12.8 oz (135.5 kg)   Physical Exam HENT:     Head: Normocephalic and atraumatic.     Nose: Nose normal.     Mouth/Throat:     Mouth: Mucous membranes are moist.     Pharynx: Oropharynx is clear.  Eyes:     Extraocular Movements: Extraocular movements intact.     Conjunctiva/sclera: Conjunctivae normal.     Pupils: Pupils are equal, round, and reactive to light.  Cardiovascular:     Rate and Rhythm: Normal rate and regular rhythm.     Pulses: Normal pulses.  Heart sounds: Normal heart sounds.  Pulmonary:     Effort: Pulmonary effort is normal.     Breath sounds: Normal breath sounds.  Musculoskeletal:        General: Normal range of motion.     Cervical back: Normal range of motion and neck supple.  Neurological:     General: No focal deficit present.     Mental Status: She is alert and oriented to person, place, and time.  Psychiatric:        Mood and Affect: Mood normal.        Behavior: Behavior normal.    ASSESSMENT AND PLAN: 1. Encounter for weight management (Primary) 2. BMI 45.0-49.9, adult [facility]) - Patient lost 5 pounds since previous office visit.  - Increase Semaglutide -Weight Management from 0.5 mg weekly to 1 mg weekly as prescribed. Counseled on medication adherence/adverse effects.  - Follow-up with primary provider in 4 weeks or sooner if needed. - Semaglutide -Weight Management 1 MG/0.5ML SOAJ; Inject 1 mg into the skin once a week.  Dispense: 2 mL; Refill: 0   Patient was given the opportunity to ask questions.  Patient verbalized understanding of the plan and was able to repeat key elements of the plan. Patient was given clear instructions to go to Emergency Department or return to medical center if symptoms don't improve, worsen, or new problems develop.The patient verbalized understanding.  Requested Prescriptions    Signed Prescriptions Disp Refills   Semaglutide -Weight Management 1 MG/0.5ML SOAJ 2 mL 0    Sig: Inject 1 mg into the skin once a week.    Follow-up with primary provider as scheduled.  Greig JINNY Drones, NP

## 2024-03-31 DIAGNOSIS — Z202 Contact with and (suspected) exposure to infections with a predominantly sexual mode of transmission: Secondary | ICD-10-CM | POA: Diagnosis not present

## 2024-03-31 DIAGNOSIS — A64 Unspecified sexually transmitted disease: Secondary | ICD-10-CM | POA: Diagnosis not present

## 2024-04-15 ENCOUNTER — Encounter: Payer: Self-pay | Admitting: Family

## 2024-04-15 NOTE — Telephone Encounter (Signed)
 Please put patient on wait list for sooner appointment with me. Also, patient may consider scheduling an appointment with any provider in our office who has a sooner appointment.

## 2024-04-16 ENCOUNTER — Other Ambulatory Visit: Payer: Self-pay | Admitting: Family

## 2024-04-16 DIAGNOSIS — Z6841 Body Mass Index (BMI) 40.0 and over, adult: Secondary | ICD-10-CM

## 2024-04-16 DIAGNOSIS — Z7689 Persons encountering health services in other specified circumstances: Secondary | ICD-10-CM

## 2024-04-16 NOTE — Telephone Encounter (Signed)
 Pt added to wait list.

## 2024-04-16 NOTE — Telephone Encounter (Unsigned)
 Copied from CRM [id number]. Topic: Clinical - Medication Refill >> Apr 16, 2024  3:28 PM Selinda RAMAN wrote: Medication: Semaglutide -Weight Management 1 MG/0.5ML SOAJ  Has the patient contacted their pharmacy? Yes   This is the patient's preferred pharmacy:  [facility] [address], [place] - [address] [place] [address] Phone: [phone] Fax: [phone]  Is this the correct pharmacy for this prescription? Yes If no, delete pharmacy and type the correct one.   Has the prescription been filled recently? No  Is the patient out of the medication? Yes  Has the patient been seen for an appointment in the last year OR does the patient have an upcoming appointment? Yes  Can we respond through MyChart? Yes  Please assist patient further

## 2024-04-18 ENCOUNTER — Telehealth: Payer: Self-pay | Admitting: Emergency Medicine

## 2024-04-18 NOTE — Telephone Encounter (Signed)
 Patient wants her wegovy  refilled however her appointment is not until 04/28/2024.  I explain to the patient that she has to come in for an appointment to get medication refill.  I sent her to the front desk to see if she could get a sooner appointment.

## 2024-04-18 NOTE — Telephone Encounter (Signed)
 Requested medications are due for refill today.  yes  Requested medications are on the active medications list.  yes  Last refill. 03/19/2024 2mL 0 rf  Future visit scheduled.   yes  Notes to clinic.  Expired labs.    Requested Prescriptions  Pending Prescriptions Disp Refills   semaglutide -weight management (WEGOVY ) 1 MG/0.5ML SOAJ SQ injection 2 mL 0    Sig: Inject 1 mg into the skin once a week.     Endocrinology:  Diabetes - GLP-1 Receptor Agonists - semaglutide  Failed - 04/18/2024 12:17 PM      Failed - Cr in normal range and within 360 days    Creatinine, Ser  Date Value Ref Range Status  10/12/2022 0.75 0.44 - 1.00 mg/dL Final         Passed - HBA1C in normal range and within 180 days    Hgb A1c MFr Bld  Date Value Ref Range Status  01/21/2024 5.0 4.8 - 5.6 % Final    Comment:             Prediabetes: 5.7 - 6.4          Diabetes: >6.4          Glycemic control for adults with diabetes: <7.0          Passed - Valid encounter within last 6 months    Recent Outpatient Visits           1 month ago Encounter for weight management   [place] [facility], [place], NP   2 months ago Encounter for weight management   [place] [facility], [place], [place]   2 months ago Encounter for weight management   [place] [facility], [place], [place]   2 years ago Anxiety and depression   [place] [facility], [place], [place]   2 years ago Encounter to establish care   [facility], Greig PARAS, NP       Future Appointments             In 1 week Lorren Greig PARAS, NP [facility], [address]

## 2024-04-23 ENCOUNTER — Encounter: Payer: Self-pay | Admitting: Family

## 2024-04-23 ENCOUNTER — Ambulatory Visit (INDEPENDENT_AMBULATORY_CARE_PROVIDER_SITE_OTHER): Admitting: Family

## 2024-04-23 VITALS — BP 112/79 | HR 75 | Temp 98.3°F | Resp 16 | Ht 66.0 in | Wt 268.0 lb

## 2024-04-23 DIAGNOSIS — E669 Obesity, unspecified: Secondary | ICD-10-CM | POA: Diagnosis not present

## 2024-04-23 DIAGNOSIS — Z6841 Body Mass Index (BMI) 40.0 and over, adult: Secondary | ICD-10-CM

## 2024-04-23 DIAGNOSIS — Z87891 Personal history of nicotine dependence: Secondary | ICD-10-CM | POA: Diagnosis not present

## 2024-04-23 DIAGNOSIS — Z7689 Persons encountering health services in other specified circumstances: Secondary | ICD-10-CM

## 2024-04-23 MED ORDER — TIRZEPATIDE-WEIGHT MANAGEMENT 2.5 MG/0.5ML ~~LOC~~ SOLN: 2.5000 mg | SUBCUTANEOUS | 0 refills | Status: AC

## 2024-04-23 NOTE — Progress Notes (Signed)
 Patient ID: Denise Buck, female    DOB: [birth year]  MRN: [id number]  CC: Weight Check  Subjective: Denise Buck is a 33 y.o. female who presents for weight check.   Her concerns today include:  States Semaglutide -Weight Management causing nausea and would like to try Zepbound .   Patient Active Problem List   Diagnosis Date Noted   H/O bilateral salpingectomy 10/24/2022   History of endometrial ablation 10/24/2022   Abnormal uterine bleeding 09/04/2022   Menorrhagia with irregular cycle 12/08/2020   Family history of breast cancer 10/04/2017   Marijuana use 11/21/2016     Current Outpatient Medications on File Prior to Visit  Medication Sig Dispense Refill   ibuprofen  (ADVIL ) 800 MG tablet Take 1 tablet (800 mg total) by mouth every 8 (eight) hours as needed. 60 tablet 2   omeprazole  (PRILOSEC) 20 MG capsule Take 1 capsule (20 mg total) by mouth daily. 90 capsule 0   Semaglutide -Weight Management 0.25 MG/0.5ML SOAJ Inject 0.5 mg into the skin once a week. 6 mL 0   Semaglutide -Weight Management 0.5 MG/0.5ML SOAJ Inject 0.5 mg into the skin once a week. 6 mL 0   Semaglutide -Weight Management 1 MG/0.5ML SOAJ Inject 1 mg into the skin once a week. 2 mL 0   meloxicam  (MOBIC ) 15 MG tablet Take 1 tablet (15 mg total) by mouth daily. (Patient not taking: Reported on 11/14/2023) 30 tablet 3   tirzepatide  (ZEPBOUND ) 2.5 MG/0.5ML injection vial Inject 2.5 mg into the skin once a week. (Patient not taking: Reported on 02/18/2024) 2 mL 0   No current facility-administered medications on file prior to visit.    Allergies  Allergen Reactions   Hydrocodone  Itching, Nausea And Vomiting and Rash    Social History   Socioeconomic History   Marital status: Single    Spouse name: Not on file   Number of children: 2   Years of education: Not on file   Highest education level: Associate degree: occupational, Scientist, product/process development, or vocational program  Occupational History   Not on file   Tobacco Use   Smoking status: Former    Types: Cigarettes    Passive exposure: Past   Smokeless tobacco: Never  Vaping Use   Vaping status: Every Day  Substance and Sexual Activity   Alcohol use: No    Comment: occ; not now   Drug use: No   Sexual activity: Yes    Birth control/protection: Condom  Other Topics Concern   Not on file  Social History Narrative   Not on file   Social Drivers of Health   Financial Resource Strain: Medium Risk (03/15/2024)   Overall Financial Resource Strain (CARDIA)    Difficulty of Paying Living Expenses: Somewhat hard  Food Insecurity: No Food Insecurity (03/15/2024)   Hunger Vital Sign    Worried About Running Out of Food in the Last Year: Never true    Ran Out of Food in the Last Year: Never true  Transportation Needs: No Transportation Needs (03/15/2024)   PRAPARE - Administrator, Civil Service (Medical): No    Lack of Transportation (Non-Medical): No  Physical Activity: Sufficiently Active (03/15/2024)   Exercise Vital Sign    Days of Exercise per Week: 5 days    Minutes of Exercise per Session: 60 min  Recent Concern: Physical Activity - Insufficiently Active (01/21/2024)   Exercise Vital Sign    Days of Exercise per Week: 3 days    Minutes of Exercise per Session: 40  min  Stress: Stress Concern Present (03/15/2024)   [employer] - Occupational Stress Questionnaire    Feeling of Stress: Rather much  Social Connections: Socially Isolated (03/15/2024)   Social Connection and Isolation Panel    Frequency of Communication with Friends and Family: More than three times a week    Frequency of Social Gatherings with Friends and Family: Once a week    Attends Religious Services: Never    Database administrator or Organizations: No    Attends Engineer, structural: Not on file    Marital Status: Never married  Intimate Partner Violence: Not At Risk (01/21/2024)   Humiliation, Afraid, Rape, and Kick  questionnaire    Fear of Current or Ex-Partner: No    Emotionally Abused: No    Physically Abused: No    Sexually Abused: No    Family History  Problem Relation Age of Onset   Autism Maternal Uncle    Cancer Paternal Aunt        breast cancer   Cancer Paternal Grandmother        breast cancer    Past Surgical History:  Procedure Laterality Date   CESAREAN SECTION     CESAREAN SECTION N/A 06/11/2017   Procedure: REPEAT CESAREAN SECTION;  Surgeon: Edsel Norleen GAILS, MD;  [facility]: [facility];  Service: Obstetrics;  Laterality: N/A;   CHOLECYSTECTOMY     DIAGNOSTIC LAPAROSCOPY WITH REMOVAL OF ECTOPIC PREGNANCY N/A 12/04/2019   Procedure: DIAGNOSTIC LAPAROSCOPY WITH REMOVAL OF ECTOPIC PREGNANCY;  Surgeon: Trudy Rozelle DASEN, MD;  Location: [facility] OR;  Service: Gynecology;  Laterality: N/A;   DILATION AND CURETTAGE OF UTERUS N/A 11/16/2018   Procedure: SUCTION DILATATION AND CURETTAGE;  Surgeon: Jayne Vonn DEL, MD;  Location: [facility];  Service: Gynecology;  Laterality: N/A;   DILITATION & CURRETTAGE/HYSTROSCOPY WITH HYDROTHERMAL ABLATION N/A 10/17/2022   Procedure: DILATATION & CURETTAGE/HYSTEROSCOPY WITH HYDROTHERMAL ABLATION;  Surgeon: Ozan, Jennifer, DO;  Location: [facility];  Service: Gynecology;  Laterality: N/A;   INDUCED ABORTION  03/10/2022   LAPAROSCOPIC UNILATERAL SALPINGECTOMY Left 12/04/2019   Procedure: LAPAROSCOPIC LEFT SALPINGECTOMY;  Surgeon: Trudy Rozelle DASEN, MD;  Location: [facility] OR;  Service: Gynecology;  Laterality: Left;   LAPAROSCOPIC UNILATERAL SALPINGECTOMY Right 10/17/2022   Procedure: LAPAROSCOPIC UNILATERAL SALPINGECTOMY;  Surgeon: Ozan, Jennifer, DO;  Location: [facility];  Service: Gynecology;  Laterality: Right;   WISDOM TOOTH EXTRACTION      ROS: Review of Systems Negative except as stated above  PHYSICAL EXAM: BP 112/79   Pulse 75   Temp 98.3 F (36.8 C) (Oral)   Resp 16   Ht 5' 6 (1.676 m)   Wt 268 lb (121.6 kg)   LMP 04/14/2024 (Exact Date)   SpO2  97%   BMI 43.26 kg/m   Wt Readings from Last 3 Encounters:  04/23/24 268 lb (121.6 kg)  03/19/24 279 lb 6.4 oz (126.7 kg)  02/18/24 284 lb 12.8 oz (129.2 kg)   Physical Exam HENT:     Head: Normocephalic and atraumatic.     Nose: Nose normal.     Mouth/Throat:     Mouth: Mucous membranes are moist.     Pharynx: Oropharynx is clear.  Eyes:     Extraocular Movements: Extraocular movements intact.     Conjunctiva/sclera: Conjunctivae normal.     Pupils: Pupils are equal, round, and reactive to light.  Cardiovascular:     Rate and Rhythm: Normal rate and regular rhythm.  Pulses: Normal pulses.     Heart sounds: Normal heart sounds.  Pulmonary:     Effort: Pulmonary effort is normal.     Breath sounds: Normal breath sounds.  Musculoskeletal:        General: Normal range of motion.     Cervical back: Normal range of motion and neck supple.  Neurological:     General: No focal deficit present.     Mental Status: She is alert and oriented to person, place, and time.  Psychiatric:        Mood and Affect: Mood normal.        Behavior: Behavior normal.    ASSESSMENT AND PLAN: 1. Encounter for weight management (Primary) 2. BMI 40.0-44.9, adult [facility]) - Patient lost 11 pounds since previous office visit.  - Trial Tirzepatide  as alternative to Semaglutide -Weight Management. Counseled on medication adherence/adverse effects.  - Follow-up with primary provider in 4 weeks or sooner if needed.  - tirzepatide  (ZEPBOUND ) 2.5 MG/0.5ML injection vial; Inject 2.5 mg into the skin once a week.  Dispense: 3 mL; Refill: 0   Patient was given the opportunity to ask questions.  Patient verbalized understanding of the plan and was able to repeat key elements of the plan. Patient was given clear instructions to go to Emergency Department or return to medical center if symptoms don't improve, worsen, or new problems develop.The patient verbalized understanding.    Requested Prescriptions    Signed Prescriptions Disp Refills   tirzepatide  (ZEPBOUND ) 2.5 MG/0.5ML injection vial 3 mL 0    Sig: Inject 2.5 mg into the skin once a week.    Return in about 4 weeks (around 05/21/2024) for Follow-Up or next available weight check.  Greig JINNY Drones, NP
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
## 2024-04-24 ENCOUNTER — Encounter (HOSPITAL_COMMUNITY): Payer: Self-pay | Admitting: Psychiatry

## 2024-04-24 ENCOUNTER — Ambulatory Visit (HOSPITAL_COMMUNITY): Payer: Self-pay | Admitting: Psychiatry

## 2024-04-24 ENCOUNTER — Telehealth: Payer: Self-pay

## 2024-04-24 ENCOUNTER — Other Ambulatory Visit: Payer: Self-pay

## 2024-04-24 DIAGNOSIS — F1290 Cannabis use, unspecified, uncomplicated: Secondary | ICD-10-CM

## 2024-04-24 DIAGNOSIS — F439 Reaction to severe stress, unspecified: Secondary | ICD-10-CM

## 2024-04-24 DIAGNOSIS — F419 Anxiety disorder, unspecified: Secondary | ICD-10-CM

## 2024-04-24 DIAGNOSIS — F1121 Opioid dependence, in remission: Secondary | ICD-10-CM

## 2024-04-24 DIAGNOSIS — R41840 Attention and concentration deficit: Secondary | ICD-10-CM | POA: Diagnosis not present

## 2024-04-24 DIAGNOSIS — F1411 Cocaine abuse, in remission: Secondary | ICD-10-CM

## 2024-04-24 DIAGNOSIS — F1421 Cocaine dependence, in remission: Secondary | ICD-10-CM | POA: Insufficient documentation

## 2024-04-24 MED ORDER — HYDROXYZINE HCL 25 MG PO TABS: 12.5000 mg | ORAL_TABLET | Freq: Two times a day (BID) | ORAL | 1 refills | Status: AC | PRN

## 2024-04-24 NOTE — Telephone Encounter (Signed)
 Pharmacy Patient Advocate Encounter   Received notification from CoverMyMeds that prior authorization for ZEPBOUND  is required/requested.   Insurance verification completed.   The patient is insured through [employer] .   Per test claim: PA required; PA submitted to above mentioned insurance via CoverMyMeds Key/confirmation #/EOC [id number] Status is pending

## 2024-04-24 NOTE — Patient Instructions (Signed)
 Thank you for attending your appointment today.  -- START Atarax 12.5-25 mg twice daily as needed for acute anxiety -- Continue other medications as prescribed.  Please do not make any changes to medications without first discussing with your provider. If you are experiencing a psychiatric emergency, please call 911 or present to your nearest emergency department. Additional crisis, medication management, and therapy resources are included below.  [facility]  [address], [place], [place] [address] [phone] WALK-IN URGENT CARE 24/7 FOR ANYONE [address] [place], [place]  [phone] Fax: [phone] [email or web address] *Interpreters available *Accepts all insurance and uninsured for Urgent Care needs *Accepts Medicaid and uninsured for outpatient treatment (below)      ONLY FOR [facility]  Below:    Outpatient New Patient Assessment/Therapy Walk-ins:        Monday, Wednesday, and Thursday 8am until slots are full (first come, first served)                   New Patient Psychiatry/Medication Management        Monday-Friday 8am-11am (first come, first served)               For all walk-ins we ask that you arrive by 7:15am, because patients will be seen in the order of arrival.

## 2024-04-25 ENCOUNTER — Other Ambulatory Visit: Payer: Self-pay

## 2024-04-28 ENCOUNTER — Ambulatory Visit: Admitting: Family

## 2024-04-28 ENCOUNTER — Other Ambulatory Visit: Payer: Self-pay | Admitting: Family

## 2024-04-28 DIAGNOSIS — Z7689 Persons encountering health services in other specified circumstances: Secondary | ICD-10-CM

## 2024-04-28 DIAGNOSIS — Z6841 Body Mass Index (BMI) 40.0 and over, adult: Secondary | ICD-10-CM

## 2024-04-28 MED ORDER — SEMAGLUTIDE-WEIGHT MANAGEMENT 1 MG/0.5ML ~~LOC~~ SOAJ: 1.0000 mg | SUBCUTANEOUS | 0 refills | Status: AC

## 2024-04-28 NOTE — Telephone Encounter (Signed)
 Complete

## 2024-05-15 ENCOUNTER — Encounter: Payer: Self-pay | Admitting: Family

## 2024-05-15 ENCOUNTER — Ambulatory Visit (HOSPITAL_COMMUNITY): Payer: Self-pay | Admitting: Clinical

## 2024-05-19 NOTE — Telephone Encounter (Signed)
 Schedule appointment?

## 2024-06-13 ENCOUNTER — Encounter: Admitting: Family

## 2024-06-13 ENCOUNTER — Telehealth: Payer: Self-pay | Admitting: Family

## 2024-06-13 NOTE — Progress Notes (Signed)
 Erroneous encounter-disregard

## 2024-06-13 NOTE — Telephone Encounter (Signed)
 Called pt to reschedule missed appt; could not reach or leave vm

## 2024-06-23 ENCOUNTER — Encounter: Payer: Self-pay | Admitting: Radiology

## 2024-06-23 NOTE — Progress Notes (Unsigned)
 Patient did not connect for virtual psychiatric medication management appointment on 06/24/24 at 2:30PM. Sent secure video link with no response. Called phone with no answer; left VM with callback number to reschedule.  LAURAINE DELENA PUMMEL, MD 06/24/24

## 2024-06-24 ENCOUNTER — Encounter (HOSPITAL_COMMUNITY): Admitting: Psychiatry

## 2024-06-24 ENCOUNTER — Encounter (HOSPITAL_COMMUNITY): Payer: Self-pay

## 2024-08-04 DIAGNOSIS — Z113 Encounter for screening for infections with a predominantly sexual mode of transmission: Secondary | ICD-10-CM | POA: Diagnosis not present

## 2024-08-04 DIAGNOSIS — A64 Unspecified sexually transmitted disease: Secondary | ICD-10-CM | POA: Diagnosis not present

## 2024-08-04 DIAGNOSIS — Z202 Contact with and (suspected) exposure to infections with a predominantly sexual mode of transmission: Secondary | ICD-10-CM | POA: Diagnosis not present

## 2024-10-13 ENCOUNTER — Ambulatory Visit: Payer: Self-pay | Admitting: Family

## 2024-11-10 ENCOUNTER — Ambulatory Visit: Payer: Self-pay | Admitting: Family
